# Patient Record
Sex: Male | Born: 1965
Health system: Southern US, Community
[De-identification: ages and names within clinical notes are randomized; demographics above are authoritative.]

## PROBLEM LIST (undated history)

## (undated) DIAGNOSIS — E291 Testicular hypofunction: Secondary | ICD-10-CM

## (undated) DIAGNOSIS — E875 Hyperkalemia: Secondary | ICD-10-CM

## (undated) DIAGNOSIS — I2510 Atherosclerotic heart disease of native coronary artery without angina pectoris: Secondary | ICD-10-CM

## (undated) DIAGNOSIS — I10 Essential (primary) hypertension: Secondary | ICD-10-CM

## (undated) DIAGNOSIS — N529 Male erectile dysfunction, unspecified: Secondary | ICD-10-CM

## (undated) DIAGNOSIS — N1831 Chronic kidney disease, stage 3a: Secondary | ICD-10-CM

## (undated) DIAGNOSIS — F329 Major depressive disorder, single episode, unspecified: Secondary | ICD-10-CM

## (undated) DIAGNOSIS — E7800 Pure hypercholesterolemia, unspecified: Secondary | ICD-10-CM

## (undated) DIAGNOSIS — M159 Polyosteoarthritis, unspecified: Secondary | ICD-10-CM

## (undated) DIAGNOSIS — E119 Type 2 diabetes mellitus without complications: Secondary | ICD-10-CM

## (undated) DIAGNOSIS — E1140 Type 2 diabetes mellitus with diabetic neuropathy, unspecified: Secondary | ICD-10-CM

## (undated) DIAGNOSIS — R5382 Chronic fatigue, unspecified: Secondary | ICD-10-CM

## (undated) DIAGNOSIS — F32A Depression, unspecified: Secondary | ICD-10-CM

## (undated) HISTORY — DX: Hyperkalemia: E87.5

## (undated) HISTORY — DX: Testicular hypofunction: E29.1

## (undated) HISTORY — DX: Male erectile dysfunction, unspecified: N52.9

## (undated) HISTORY — DX: Atherosclerotic heart disease of native coronary artery without angina pectoris: I25.10

## (undated) HISTORY — PX: KNEE SURGERY: SHX244

## (undated) HISTORY — DX: Chronic fatigue, unspecified: R53.82

## (undated) HISTORY — PX: EYE SURGERY: SHX253

## (undated) HISTORY — DX: Type 2 diabetes mellitus with diabetic neuropathy, unspecified: E11.40

## (undated) HISTORY — DX: Chronic kidney disease, stage 3a: N18.31

## (undated) HISTORY — DX: Type 2 diabetes mellitus without complications: E11.9

## (undated) HISTORY — DX: Polyosteoarthritis, unspecified: M15.9

## (undated) HISTORY — DX: Essential (primary) hypertension: I10

---

## 2006-02-01 HISTORY — PX: CORONARY ANGIOPLASTY WITH STENT PLACEMENT: SHX49

## 2007-03-30 ENCOUNTER — Emergency Department (HOSPITAL_COMMUNITY): Admission: EM | Admit: 2007-03-30 | Discharge: 2007-03-30 | Payer: Self-pay | Admitting: Emergency Medicine

## 2008-02-02 HISTORY — PX: CARDIAC CATHETERIZATION: SHX172

## 2008-04-18 ENCOUNTER — Emergency Department (HOSPITAL_COMMUNITY): Admission: EM | Admit: 2008-04-18 | Discharge: 2008-04-18 | Payer: Self-pay | Admitting: Emergency Medicine

## 2008-07-23 ENCOUNTER — Inpatient Hospital Stay (HOSPITAL_COMMUNITY): Admission: EM | Admit: 2008-07-23 | Discharge: 2008-07-25 | Payer: Self-pay | Admitting: Emergency Medicine

## 2008-09-02 ENCOUNTER — Emergency Department (HOSPITAL_COMMUNITY): Admission: EM | Admit: 2008-09-02 | Discharge: 2008-09-02 | Payer: Self-pay | Admitting: Emergency Medicine

## 2009-08-14 ENCOUNTER — Emergency Department (HOSPITAL_COMMUNITY): Admission: EM | Admit: 2009-08-14 | Discharge: 2009-08-14 | Payer: Self-pay | Admitting: Family Medicine

## 2010-03-03 ENCOUNTER — Emergency Department (HOSPITAL_COMMUNITY)
Admission: EM | Admit: 2010-03-03 | Discharge: 2010-03-03 | Payer: Self-pay | Source: Home / Self Care | Admitting: Emergency Medicine

## 2010-03-03 LAB — URINE MICROSCOPIC-ADD ON

## 2010-03-03 LAB — DIFFERENTIAL
Basophils Absolute: 0 10*3/uL (ref 0.0–0.1)
Basophils Relative: 0 % (ref 0–1)
Eosinophils Absolute: 0 10*3/uL (ref 0.0–0.7)
Eosinophils Relative: 0 % (ref 0–5)
Monocytes Relative: 5 % (ref 3–12)
Neutro Abs: 6.7 10*3/uL (ref 1.7–7.7)

## 2010-03-03 LAB — COMPREHENSIVE METABOLIC PANEL
CO2: 22 mEq/L (ref 19–32)
Calcium: 10 mg/dL (ref 8.4–10.5)
Creatinine, Ser: 1.24 mg/dL (ref 0.4–1.5)
GFR calc Af Amer: >60 mL/min (ref >60)
Glucose, Bld: 374 mg/dL — ABNORMAL HIGH (ref 70–99)
Sodium: 135 mEq/L (ref 135–145)
Total Bilirubin: 0.3 mg/dL (ref 0.3–1.2)
Total Protein: 7.1 g/dL (ref 6.0–8.3)

## 2010-03-03 LAB — POCT I-STAT, CHEM 8
Hemoglobin: 15.6 g/dL (ref 13.0–17.0)
Sodium: 137 mEq/L (ref 135–145)
TCO2: 22 mmol/L (ref 0–100)

## 2010-03-03 LAB — URINALYSIS, ROUTINE W REFLEX MICROSCOPIC
Hgb urine dipstick: NEGATIVE
Ketones, ur: NEGATIVE mg/dL
Leukocytes, UA: NEGATIVE

## 2010-03-03 LAB — POCT CARDIAC MARKERS: CKMB, poc: 1.9 ng/mL (ref 1.0–8.0)

## 2010-03-03 LAB — CBC
Hemoglobin: 14.7 g/dL (ref 13.0–17.0)
MCH: 28.4 pg (ref 26.0–34.0)
RBC: 5.17 MIL/uL (ref 4.22–5.81)
RDW: 13.3 % (ref 11.5–15.5)

## 2010-03-03 LAB — GLUCOSE, CAPILLARY: Glucose-Capillary: 270 mg/dL — ABNORMAL HIGH (ref 70–99)

## 2010-05-10 LAB — URINALYSIS, ROUTINE W REFLEX MICROSCOPIC
Ketones, ur: NEGATIVE mg/dL
Nitrite: NEGATIVE
Protein, ur: NEGATIVE mg/dL
Urobilinogen, UA: 1 mg/dL (ref 0.0–1.0)
pH: 5.5 (ref 5.0–8.0)

## 2010-05-10 LAB — GLUCOSE, CAPILLARY: Glucose-Capillary: 195 mg/dL — ABNORMAL HIGH (ref 70–99)

## 2010-05-11 LAB — RAPID URINE DRUG SCREEN, HOSP PERFORMED
Amphetamines: NOT DETECTED
Benzodiazepines: NOT DETECTED
Cocaine: NOT DETECTED
Opiates: NOT DETECTED
Tetrahydrocannabinol: POSITIVE — AB

## 2010-05-11 LAB — CARDIAC PANEL(CRET KIN+CKTOT+MB+TROPI)
CK, MB: 0.8 ng/mL (ref 0.3–4.0)
Relative Index: 0.9 (ref 0.0–2.5)
Total CK: 119 U/L (ref 7–232)
Total CK: 95 U/L (ref 7–232)
Troponin I: 0.01 ng/mL (ref 0.00–0.06)
Troponin I: 0.02 ng/mL (ref 0.00–0.06)

## 2010-05-11 LAB — DIFFERENTIAL
Basophils Absolute: 0.1 10*3/uL (ref 0.0–0.1)
Basophils Relative: 2 % — ABNORMAL HIGH (ref 0–1)
Eosinophils Absolute: 0 10*3/uL (ref 0.0–0.7)
Eosinophils Absolute: 0.1 10*3/uL (ref 0.0–0.7)
Eosinophils Relative: 1 % (ref 0–5)
Neutro Abs: 2.8 10*3/uL (ref 1.7–7.7)
Neutro Abs: 4 10*3/uL (ref 1.7–7.7)
Neutrophils Relative %: 47 % (ref 43–77)

## 2010-05-11 LAB — CBC
HCT: 48 % (ref 39.0–52.0)
Hemoglobin: 14.9 g/dL (ref 13.0–17.0)
Hemoglobin: 16.4 g/dL (ref 13.0–17.0)
MCHC: 33.8 g/dL (ref 30.0–36.0)
MCHC: 34.3 g/dL (ref 30.0–36.0)
MCV: 89.5 fL (ref 78.0–100.0)
Platelets: 176 10*3/uL (ref 150–400)
Platelets: 181 10*3/uL (ref 150–400)
Platelets: 197 10*3/uL (ref 150–400)
RBC: 4.91 MIL/uL (ref 4.22–5.81)
RBC: 5.4 MIL/uL (ref 4.22–5.81)
WBC: 6.1 10*3/uL (ref 4.0–10.5)
WBC: 7.5 10*3/uL (ref 4.0–10.5)
WBC: 8 10*3/uL (ref 4.0–10.5)

## 2010-05-11 LAB — HEMOGLOBIN A1C
Hgb A1c MFr Bld: 8.6 % — ABNORMAL HIGH (ref 4.6–6.1)
Mean Plasma Glucose: 200 mg/dL

## 2010-05-11 LAB — BASIC METABOLIC PANEL
BUN: 13 mg/dL (ref 6–23)
Calcium: 8.9 mg/dL (ref 8.4–10.5)
Chloride: 106 mEq/L (ref 96–112)
Creatinine, Ser: 1.14 mg/dL (ref 0.4–1.5)

## 2010-05-11 LAB — COMPREHENSIVE METABOLIC PANEL
Alkaline Phosphatase: 52 U/L (ref 39–117)
BUN: 14 mg/dL (ref 6–23)
Chloride: 107 mEq/L (ref 96–112)
Creatinine, Ser: 1.1 mg/dL (ref 0.4–1.5)
GFR calc non Af Amer: >60 mL/min (ref >60)
Glucose, Bld: 200 mg/dL — ABNORMAL HIGH (ref 70–99)
Potassium: 4.3 mEq/L (ref 3.5–5.1)
Total Bilirubin: 0.5 mg/dL (ref 0.3–1.2)

## 2010-05-11 LAB — LIPID PANEL: Triglycerides: 162 mg/dL — ABNORMAL HIGH (ref <150)

## 2010-05-11 LAB — C-REACTIVE PROTEIN: CRP: 0.2 mg/dL — ABNORMAL LOW (ref <0.6)

## 2010-05-11 LAB — POCT CARDIAC MARKERS
CKMB, poc: <1 ng/mL — ABNORMAL LOW (ref 1.0–8.0)
Troponin i, poc: <0.05 ng/mL (ref 0.00–0.09)

## 2010-05-11 LAB — HEPARIN LEVEL (UNFRACTIONATED): Heparin Unfractionated: 0.68 IU/mL (ref 0.30–0.70)

## 2010-05-11 LAB — POCT I-STAT, CHEM 8
Creatinine, Ser: 1.2 mg/dL (ref 0.4–1.5)
TCO2: 21 mmol/L (ref 0–100)

## 2010-05-11 LAB — GLUCOSE, CAPILLARY
Glucose-Capillary: 148 mg/dL — ABNORMAL HIGH (ref 70–99)
Glucose-Capillary: 160 mg/dL — ABNORMAL HIGH (ref 70–99)
Glucose-Capillary: 233 mg/dL — ABNORMAL HIGH (ref 70–99)

## 2010-05-11 LAB — TROPONIN I: Troponin I: 0.02 ng/mL (ref 0.00–0.06)

## 2010-05-14 LAB — HEPATIC FUNCTION PANEL
Alkaline Phosphatase: 70 U/L (ref 39–117)
Indirect Bilirubin: 0.7 mg/dL (ref 0.3–0.9)
Total Bilirubin: 1 mg/dL (ref 0.3–1.2)

## 2010-05-14 LAB — CBC
Hemoglobin: 15.6 g/dL (ref 13.0–17.0)
MCHC: 34.9 g/dL (ref 30.0–36.0)
MCV: 86.7 fL (ref 78.0–100.0)
RBC: 5.16 MIL/uL (ref 4.22–5.81)

## 2010-05-14 LAB — BASIC METABOLIC PANEL
CO2: 22 mEq/L (ref 19–32)
Chloride: 106 mEq/L (ref 96–112)
GFR calc Af Amer: >60 mL/min (ref >60)
Sodium: 137 mEq/L (ref 135–145)

## 2010-05-14 LAB — POCT CARDIAC MARKERS
Myoglobin, poc: 75 ng/mL (ref 12–200)
Troponin i, poc: <0.05 ng/mL (ref 0.00–0.09)

## 2010-05-14 LAB — TROPONIN I: Troponin I: <0.01 ng/mL (ref 0.00–0.06)

## 2010-06-16 NOTE — Cardiovascular Report (Signed)
NAME:  [name], [name]                  ACCOUNT NO.:  [id number]   MEDICAL RECORD NO.:  [id number]          PATIENT TYPE:  INP   LOCATION:  3707                         FACILITY:  [facility]   PHYSICIAN:  [name], M.D. DATE OF BIRTH:  05-17-1965   DATE OF PROCEDURE:  07/24/2008  DATE OF DISCHARGE:                            CARDIAC CATHETERIZATION   PROCEDURE:  Left cardiac catheterization with selective left and right  coronary angiography, left ventricular graft via right groin using  Judkins technique.   INDICATIONS FOR PROCEDURE:  [name] is a 45 year old black male with  past medical history significant for non-insulin-dependent diabetes  mellitus, strong family history of coronary artery disease.  Father had  MI x4, first MI was in his 57s, morbid obesity.  He came to the ER  complaining of retrosternal chest pain described as needle and sharp,  grade 7/10 associated with nausea, vomiting, and diaphoresis while at  work.  The patient also gives history of exertional chest pain relieves  with rest.  Denies any shortness of breath, palpitation,  lightheadedness, or syncope.  The patient had CT angio in the ED, which  showed haziness in left main likely artifact and narrowing in diagonal 1  and 2 and was admitted for further evaluation.  Due to recurrent chest  pain, multiple risk factors, and positive CT angio, discussed with the  patient at length regarding left cath, possible PTCA stenting, its risks  and benefits, i.e. death, MI, stroke, need for emergency CABG, risk of  restenosis, local vascular complications, etc. and consented for the  procedure.   PROCEDURE:  After obtaining the informed consent, the patient was  brought to the cath lab and was placed on fluoroscopy table.  Right  groin was prepped and draped in usual fashion.  Xylocaine 2% was used  for local anesthesia in the right groin.  With the help of thin-wall  needle, 6-French arterial sheath was placed.  The  sheath was aspirated  and flushed.  Next, a 6-French left Judkins catheter was advanced over  the wire under fluoroscopic guidance up to the ascending aorta.  Wire  was pulled out, the catheter was aspirated, and connected to the  manifold.  Catheter was further advanced and engaged into left coronary  ostium.  Multiple views of the left system were taken.  Next, the  catheter was disengaged and was pulled out over the wire and was  replaced with 6-French right Judkins catheter, which was advanced over  the wire under fluoroscopic guidance up to the ascending aorta.  Wire  was pulled out, the catheter was aspirated, and connected to the  manifold.  Catheter was further advanced and engaged into right coronary  ostium.  Multiple views of the right system were taken.  Next, catheter  was disengaged and was pulled out over the wire and was replaced with 6-  French pigtail catheter, which was advanced over the wire under  fluoroscopic guidance up to the ascending aorta.  Wire was pulled out,  the catheter was aspirated and connected to the manifold.  Catheter was  further advanced across the aortic valve into the LV.  LV pressures were  recorded.  Next, LV graft was done in 30-degree RAO position.  Next,  postangiographic pressures were recorded from LV and then pullback  pressures were recorded from the aorta.  There was no gradient across  the aortic valve.  Next, the pigtail catheter was pulled out over the  wire.  Sheaths were aspirated and flushed.   FINDINGS:  LV showed good LV systolic function.  LVH, EF of 50-55%.  Left main was patent.  LAD was patent.  High diagonal 1 has 20-25%  ostial stenosis.  Diagonal 2 and 3 were patent.  Left circumflex has 20-  25% proximal  stenosis.  OM-1 is small, which is patent.  OM-2 is very small.  OM III  is very small.  RCA has 5-10% ostial stenosis.  PDA and PLV branches  were patent.  The patient tolerated the procedure well.  There were no   complications.  The patient was transferred to the recovery room in  stable condition.      [name]. Sharyn Watts, M.D.  Electronically Signed     MNH/MEDQ  D:  07/24/2008  T:  07/25/2008  Job:  [id number]   cc:   Cath Lab

## 2010-06-16 NOTE — Discharge Summary (Signed)
NAME:  [name], [name]                  ACCOUNT NO.:  [id number]   MEDICAL RECORD NO.:  [id number]           PATIENT TYPE:   LOCATION:                                 FACILITY:   PHYSICIAN:  Mohan N. Sharyn Lull, M.D. DATE OF BIRTH:  October 27, 1965   DATE OF ADMISSION:  DATE OF DISCHARGE:                               DISCHARGE SUMMARY   ADMITTING DIAGNOSES:  1. Atypical chest pain.  The patient had positive CT angiography, rule      out coronary insufficiency.  2. Non-insulin-dependent diabetes mellitus.  3. Morbid obesity.  4. Positive family history of coronary artery disease.   FINAL DIAGNOSES:  1. Status post chest pain, positive CT angiography, status post left      catheterization, mild coronary artery disease.  2. Non-insulin-dependent diabetes mellitus.  3. Morbid obesity.  4. Hypercholesteremia, controlled by diet.  5. Positive family history for coronary artery disease.  6. Left shoulder musculoskeletal pain.   DISCHARGE HOME MEDICATIONS:  1. Enteric-coated aspirin 81 mg 1 tablet daily.  2. Lisinopril 5 mg 1 tablet daily.  3. Amaryl 4 mg 1 tablet twice daily.  4. Metformin 1000 mg with lunch and 1500 mg with supper, starting from      July 26, 2008.  5. Pepcid 20 mg 1 tablet twice daily.  6. Percocet 5/325 one tablet every 6 hours as needed for pain.   DIET:  Low-salt, low-cholesterol 1800 calories ADA diet.  The patient  has been advised to reduce weight.  Post cardiac cath instructions have  been given.   ACTIVITY:  Avoid any lifting, pushing, or pulling for 48 hours.   The patient has been advised to monitor blood sugar daily.  Follow up  with me in 1 week.   CONDITION AT DISCHARGE:  Stable.   BRIEF HISTORY AND HOSPITAL COURSE:  [name] is a 45 year old black male  with past medical history significant for non-insulin-dependent diabetes  mellitus; strong family history of coronary artery disease, his father  had MI x4, first MI was at the age of 20; morbid obesity.   He came to  the ER complaining of retrosternal chest pain described as needle and  sharp pain, grade 7/10, associated with nausea, vomiting, and  diaphoresis while at work.  The patient also gives history of exertional  chest pain, relieves with rest.  Denies any shortness of breath,  palpitation, lightheadedness, or syncope.  The patient had CT angio in  the ED, which showed haziness in the left main likely due to artifact  and narrowing in diagonal 1 and d2.  The patient was admitted for  further evaluation.   PAST MEDICAL HISTORY:  As above.   PAST SURGICAL HISTORY:  He had left knee surgery in the past.   ALLERGIES:  No known drug allergies.   MEDICATIONS AT HOME:  He was on:  1. Glucophage 1000 mg p.o. b.i.d.  2. Glucotrol 5 mg p.o. b.i.d.   SOCIAL HISTORY:  He is single, 2 children.  No history of smoking or  alcohol abuse.  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   FAMILY HISTORY:  Father died of MI at the age of 20.  His first MI was  at the age of 55.  Mother is alive.  She is hypertensive, and he has 1  brother and sister.  They are in good health.   PHYSICAL EXAMINATION:  GENERAL:  He is alert, awake, and oriented x3, in  no acute distress.  VITAL SIGNS:  Blood pressure was 119/87, pulse was 82 and regular.  HEENT:  Conjunctivae pink.  NECK:  Supple.  No JVD.  No bruit.  LUNGS:  Clear to auscultation without rhonchi or rales.  CARDIOVASCULAR:  S1 and S2 were normal.  There was no murmur.  There was  no S3 or S4 gallop.  ABDOMEN:  Soft.  Bowel sounds were present, nontender.  EXTREMITIES:  There is no clubbing, cyanosis, or edema.   LABORATORY DATA:  EKG showed normal sinus rhythm with early  repolarization changes.  Hemoglobin was 15.4, hematocrit 48, white count of 8.0.  His sodium was  139, potassium 4.4, glucose 152, BUN 23, creatinine 1.2.  Urine drug  screen was negative.  His 3 sets of cardiac enzymes were negative.  C-  reactive protein was 0.2,  which was in normal range.  Hemoglobin A1c was  elevated at 8.6.  His cholesterol was 158, triglyceride 162, HDL 35, LDL  of 91.   BRIEF HOSPITAL COURSE:  The patient was admitted to telemetry unit.  MI  was ruled out by serial enzymes and EKG.  The patient did not have any  episode of further chest pain during the hospital stay.  I discussed  with the patient regarding CT angio finding and left cath, possible PTCA  and stenting, its risks and benefits, and consented for the procedure.  The patient underwent left cardiac cath with selective left and right  coronary angiography as per procedure report.  The patient was noted to  have mild coronary artery disease.  The patient postprocedure did not  have any chest pain.  His groin is stable with no evidence of hematoma  or bruit.  The patient has been ambulating in the hallway without any  problems.  The patient will be discharged home on above medications and  will be followed up in my office next week.      Eduardo Osier. Sharyn Lull, M.D.  Electronically Signed     MNH/MEDQ  D:  07/25/2008  T:  07/25/2008  Job:  [id number]

## 2010-07-15 ENCOUNTER — Inpatient Hospital Stay (HOSPITAL_COMMUNITY)
Admission: EM | Admit: 2010-07-15 | Discharge: 2010-07-22 | DRG: 392 | Disposition: A | Discharge status: Home / Self Care | Payer: Managed Care, Other (non HMO) | Attending: Internal Medicine | Admitting: Internal Medicine

## 2010-07-15 DIAGNOSIS — I10 Essential (primary) hypertension: Secondary | ICD-10-CM | POA: Diagnosis present

## 2010-07-15 DIAGNOSIS — K5289 Other specified noninfective gastroenteritis and colitis: Secondary | ICD-10-CM | POA: Diagnosis present

## 2010-07-15 DIAGNOSIS — E119 Type 2 diabetes mellitus without complications: Secondary | ICD-10-CM | POA: Diagnosis present

## 2010-07-15 DIAGNOSIS — F411 Generalized anxiety disorder: Secondary | ICD-10-CM | POA: Diagnosis present

## 2010-07-15 DIAGNOSIS — M940 Chondrocostal junction syndrome [Tietze]: Secondary | ICD-10-CM | POA: Diagnosis present

## 2010-07-15 DIAGNOSIS — R933 Abnormal findings on diagnostic imaging of other parts of digestive tract: Secondary | ICD-10-CM | POA: Diagnosis present

## 2010-07-15 DIAGNOSIS — Z79899 Other long term (current) drug therapy: Secondary | ICD-10-CM

## 2010-07-15 DIAGNOSIS — R109 Unspecified abdominal pain: Principal | ICD-10-CM | POA: Diagnosis present

## 2010-07-15 DIAGNOSIS — E785 Hyperlipidemia, unspecified: Secondary | ICD-10-CM | POA: Diagnosis present

## 2010-07-15 DIAGNOSIS — K644 Residual hemorrhoidal skin tags: Secondary | ICD-10-CM | POA: Diagnosis present

## 2010-07-15 LAB — DIFFERENTIAL
Basophils Absolute: 0 10*3/uL (ref 0.0–0.1)
Basophils Relative: 0 % (ref 0–1)
Eosinophils Absolute: 0.1 10*3/uL (ref 0.0–0.7)
Eosinophils Relative: 1 % (ref 0–5)
Lymphs Abs: 4.9 10*3/uL — ABNORMAL HIGH (ref 0.7–4.0)
Neutrophils Relative %: 40 % — ABNORMAL LOW (ref 43–77)

## 2010-07-15 LAB — CBC
MCV: 83 fL (ref 78.0–100.0)
Platelets: 197 10*3/uL (ref 150–400)
RBC: 5 MIL/uL (ref 4.22–5.81)
RDW: 13.1 % (ref 11.5–15.5)
WBC: 9.2 10*3/uL (ref 4.0–10.5)

## 2010-07-15 LAB — POCT I-STAT, CHEM 8
BUN: 15 mg/dL (ref 6–23)
Chloride: 105 mEq/L (ref 96–112)
HCT: 45 % (ref 39.0–52.0)
Potassium: 4.2 mEq/L (ref 3.5–5.1)
Sodium: 137 mEq/L (ref 135–145)

## 2010-07-15 LAB — OCCULT BLOOD, POC DEVICE: Fecal Occult Bld: POSITIVE

## 2010-07-16 ENCOUNTER — Emergency Department (HOSPITAL_COMMUNITY): Payer: Managed Care, Other (non HMO)

## 2010-07-16 ENCOUNTER — Encounter (HOSPITAL_COMMUNITY): Payer: Self-pay

## 2010-07-16 LAB — GLUCOSE, CAPILLARY: Glucose-Capillary: 204 mg/dL — ABNORMAL HIGH (ref 70–99)

## 2010-07-16 LAB — CBC
HCT: 37.9 % — ABNORMAL LOW (ref 39.0–52.0)
HCT: 38.5 % — ABNORMAL LOW (ref 39.0–52.0)
Hemoglobin: 13.3 g/dL (ref 13.0–17.0)
MCH: 28.7 pg (ref 26.0–34.0)
MCV: 83.7 fL (ref 78.0–100.0)
Platelets: 154 10*3/uL (ref 150–400)
RBC: 4.53 MIL/uL (ref 4.22–5.81)
RBC: 4.63 MIL/uL (ref 4.22–5.81)
RDW: 13.1 % (ref 11.5–15.5)
WBC: 6.2 10*3/uL (ref 4.0–10.5)

## 2010-07-16 LAB — LIPID PANEL
Cholesterol: 100 mg/dL (ref 0–200)
HDL: 34 mg/dL — ABNORMAL LOW (ref >39)
Triglycerides: 133 mg/dL (ref <150)

## 2010-07-16 LAB — URINALYSIS, ROUTINE W REFLEX MICROSCOPIC
Hgb urine dipstick: NEGATIVE
Nitrite: NEGATIVE
Protein, ur: NEGATIVE mg/dL
Specific Gravity, Urine: 1.034 — ABNORMAL HIGH (ref 1.005–1.030)
Urobilinogen, UA: 1 mg/dL (ref 0.0–1.0)

## 2010-07-16 LAB — CARDIAC PANEL(CRET KIN+CKTOT+MB+TROPI)
CK, MB: 2.2 ng/mL (ref 0.3–4.0)
CK, MB: 2 ng/mL (ref 0.3–4.0)
Relative Index: 1.4 (ref 0.0–2.5)
Relative Index: 1.6 (ref 0.0–2.5)
Total CK: 136 U/L (ref 7–232)
Total CK: 129 U/L (ref 7–232)
Troponin I: <0.3 ng/mL (ref <0.30)
Troponin I: <0.3 ng/mL (ref <0.30)

## 2010-07-16 LAB — URINE MICROSCOPIC-ADD ON

## 2010-07-16 LAB — CROSSMATCH: ABO/RH(D): B POS

## 2010-07-16 LAB — SAMPLE TO BLOOD BANK

## 2010-07-16 MED ORDER — IOHEXOL 300 MG/ML  SOLN
100.0000 mL | Freq: Once | INTRAMUSCULAR | Status: AC | PRN
  Administered 2010-07-16: 100 mL via INTRAVENOUS

## 2010-07-17 LAB — CBC
HCT: 36.4 % — ABNORMAL LOW (ref 39.0–52.0)
MCH: 29.1 pg (ref 26.0–34.0)
MCH: 28.5 pg (ref 26.0–34.0)
MCHC: 35 g/dL (ref 30.0–36.0)
MCV: 83.3 fL (ref 78.0–100.0)
MCV: 83.6 fL (ref 78.0–100.0)
Platelets: 159 10*3/uL (ref 150–400)
Platelets: 160 10*3/uL (ref 150–400)
Platelets: 176 10*3/uL (ref 150–400)
RBC: 4.45 MIL/uL (ref 4.22–5.81)
RBC: 4.46 MIL/uL (ref 4.22–5.81)
RDW: 13.1 % (ref 11.5–15.5)
RDW: 13 % (ref 11.5–15.5)
RDW: 12.8 % (ref 11.5–15.5)
WBC: 5.2 10*3/uL (ref 4.0–10.5)
WBC: 5.5 10*3/uL (ref 4.0–10.5)
WBC: 6.3 10*3/uL (ref 4.0–10.5)

## 2010-07-17 LAB — COMPREHENSIVE METABOLIC PANEL
AST: 16 U/L (ref 0–37)
Albumin: 3.1 g/dL — ABNORMAL LOW (ref 3.5–5.2)
Calcium: 8.6 mg/dL (ref 8.4–10.5)
Creatinine, Ser: 0.84 mg/dL (ref 0.50–1.35)
GFR calc non Af Amer: >60 mL/min (ref >60)

## 2010-07-17 LAB — URINE CULTURE
Colony Count: NO GROWTH
Culture  Setup Time: 201206140453

## 2010-07-17 LAB — GLUCOSE, CAPILLARY

## 2010-07-18 DIAGNOSIS — R1031 Right lower quadrant pain: Secondary | ICD-10-CM

## 2010-07-18 LAB — CBC
HCT: 37.7 % — ABNORMAL LOW (ref 39.0–52.0)
HCT: 38.8 % — ABNORMAL LOW (ref 39.0–52.0)
HCT: 36.5 % — ABNORMAL LOW (ref 39.0–52.0)
HCT: 35.9 % — ABNORMAL LOW (ref 39.0–52.0)
Hemoglobin: 12.6 g/dL — ABNORMAL LOW (ref 13.0–17.0)
Hemoglobin: 12.6 g/dL — ABNORMAL LOW (ref 13.0–17.0)
Hemoglobin: 12.8 g/dL — ABNORMAL LOW (ref 13.0–17.0)
Hemoglobin: 12.9 g/dL — ABNORMAL LOW (ref 13.0–17.0)
Hemoglobin: 13.5 g/dL (ref 13.0–17.0)
MCH: 29 pg (ref 26.0–34.0)
MCHC: 34.5 g/dL (ref 30.0–36.0)
MCHC: 35.7 g/dL (ref 30.0–36.0)
MCV: 82.9 fL (ref 78.0–100.0)
MCV: 82.6 fL (ref 78.0–100.0)
MCV: 82.6 fL (ref 78.0–100.0)
RBC: 4.34 MIL/uL (ref 4.22–5.81)
RBC: 4.36 MIL/uL (ref 4.22–5.81)
RBC: 4.55 MIL/uL (ref 4.22–5.81)
RBC: 4.7 MIL/uL (ref 4.22–5.81)
RDW: 12.8 % (ref 11.5–15.5)
RDW: 12.8 % (ref 11.5–15.5)
WBC: 5.5 10*3/uL (ref 4.0–10.5)
WBC: 5.8 10*3/uL (ref 4.0–10.5)
WBC: 5.8 10*3/uL (ref 4.0–10.5)

## 2010-07-18 LAB — BASIC METABOLIC PANEL
BUN: 9 mg/dL (ref 6–23)
CO2: 24 mEq/L (ref 19–32)
Chloride: 107 mEq/L (ref 96–112)
Creatinine, Ser: 0.92 mg/dL (ref 0.50–1.35)
Glucose, Bld: 189 mg/dL — ABNORMAL HIGH (ref 70–99)
Potassium: 4.2 mEq/L (ref 3.5–5.1)

## 2010-07-18 LAB — GLUCOSE, CAPILLARY: Glucose-Capillary: 269 mg/dL — ABNORMAL HIGH (ref 70–99)

## 2010-07-18 LAB — DIFFERENTIAL
Basophils Absolute: 0 10*3/uL (ref 0.0–0.1)
Eosinophils Relative: 1 % (ref 0–5)
Lymphocytes Relative: 51 % — ABNORMAL HIGH (ref 12–46)
Lymphs Abs: 2.8 10*3/uL (ref 0.7–4.0)
Monocytes Absolute: 0.3 10*3/uL (ref 0.1–1.0)
Neutro Abs: 2.4 10*3/uL (ref 1.7–7.7)

## 2010-07-19 DIAGNOSIS — R1031 Right lower quadrant pain: Secondary | ICD-10-CM

## 2010-07-19 LAB — CBC
HCT: 36.5 % — ABNORMAL LOW (ref 39.0–52.0)
Hemoglobin: 12.3 g/dL — ABNORMAL LOW (ref 13.0–17.0)
Hemoglobin: 12.8 g/dL — ABNORMAL LOW (ref 13.0–17.0)
Hemoglobin: 13 g/dL (ref 13.0–17.0)
MCH: 29 pg (ref 26.0–34.0)
MCV: 82.6 fL (ref 78.0–100.0)
Platelets: 164 10*3/uL (ref 150–400)
Platelets: 164 10*3/uL (ref 150–400)
RBC: 4.24 MIL/uL (ref 4.22–5.81)
RBC: 4.42 MIL/uL (ref 4.22–5.81)
RBC: 4.46 MIL/uL (ref 4.22–5.81)
WBC: 5.5 10*3/uL (ref 4.0–10.5)
WBC: 5.1 10*3/uL (ref 4.0–10.5)

## 2010-07-19 LAB — URINALYSIS, ROUTINE W REFLEX MICROSCOPIC
Ketones, ur: NEGATIVE mg/dL
Leukocytes, UA: NEGATIVE
Nitrite: NEGATIVE
Protein, ur: NEGATIVE mg/dL
Urobilinogen, UA: 0.2 mg/dL (ref 0.0–1.0)
pH: 6 (ref 5.0–8.0)

## 2010-07-19 LAB — GLUCOSE, CAPILLARY
Glucose-Capillary: 223 mg/dL — ABNORMAL HIGH (ref 70–99)
Glucose-Capillary: 192 mg/dL — ABNORMAL HIGH (ref 70–99)
Glucose-Capillary: 163 mg/dL — ABNORMAL HIGH (ref 70–99)
Glucose-Capillary: 224 mg/dL — ABNORMAL HIGH (ref 70–99)

## 2010-07-19 LAB — LIPASE, BLOOD: Lipase: 35 U/L (ref 11–59)

## 2010-07-19 LAB — BASIC METABOLIC PANEL
CO2: 24 mEq/L (ref 19–32)
Chloride: 104 mEq/L (ref 96–112)
Creatinine, Ser: 0.97 mg/dL (ref 0.50–1.35)
GFR calc Af Amer: >60 mL/min (ref >60)
Potassium: 4.2 mEq/L (ref 3.5–5.1)
Sodium: 137 mEq/L (ref 135–145)

## 2010-07-20 ENCOUNTER — Inpatient Hospital Stay (HOSPITAL_COMMUNITY): Payer: Managed Care, Other (non HMO)

## 2010-07-20 LAB — GLUCOSE, CAPILLARY
Glucose-Capillary: 317 mg/dL — ABNORMAL HIGH (ref 70–99)
Glucose-Capillary: 215 mg/dL — ABNORMAL HIGH (ref 70–99)

## 2010-07-21 LAB — BASIC METABOLIC PANEL
BUN: 12 mg/dL (ref 6–23)
CO2: 25 mEq/L (ref 19–32)
Calcium: 9.8 mg/dL (ref 8.4–10.5)
Chloride: 104 mEq/L (ref 96–112)
Creatinine, Ser: 0.96 mg/dL (ref 0.50–1.35)

## 2010-07-21 LAB — CBC
HCT: 39.6 % (ref 39.0–52.0)
MCH: 28.8 pg (ref 26.0–34.0)
MCV: 81.5 fL (ref 78.0–100.0)
Platelets: 212 10*3/uL (ref 150–400)
RBC: 4.86 MIL/uL (ref 4.22–5.81)

## 2010-07-21 LAB — GLUCOSE, CAPILLARY: Glucose-Capillary: 215 mg/dL — ABNORMAL HIGH (ref 70–99)

## 2010-07-22 LAB — GLUCOSE, CAPILLARY: Glucose-Capillary: 312 mg/dL — ABNORMAL HIGH (ref 70–99)

## 2010-07-22 NOTE — Discharge Summary (Signed)
[name], [name]                  ACCOUNT NO.:  [id number]  MEDICAL RECORD NO.:  [id number]  LOCATION:  1417                         FACILITY:  [facility]  PHYSICIAN:  Andreas Blower, MD       DATE OF BIRTH:  01/05/1966  DATE OF ADMISSION:  07/15/2010 DATE OF DISCHARGE:                              DISCHARGE SUMMARY   PRIMARY CARE PHYSICIAN:  In [place].  GASTROENTEROLOGY:  Jordan Hawks. Elnoria Howard, MD  SURGERY:  [facility], Troy Sine. Dwain Sarna, MD  DISCHARGE DIAGNOSES: 1. Right abdominal pain. 2. Lower gastrointestinal bleed. 3. Type 2 diabetes. 4. Hypertension. 5. Hyperlipidemia.  DISCHARGE MEDICATIONS:  To be dictated by the discharging physician at the time of discharge.  BRIEF ADMITTING HISTORY AND PHYSICAL:  [name] is a 45 year old African American male with history of hypertension, diabetes, hyperlipidemia who presents with right-sided abdominal pain and rectal bleeding.  RADIOLOGY/IMAGING: 1. The patient had CT of the abdomen and pelvis with contrast on July 16, 2010, which showed mural thickening of the ascending colon     which may represent colitis.  Diverticulitis is unlikely. 2. The patient had abdominal series on July 20, 2010, which shows no     acute or significant findings.  LABORATORY DATA:  CBC shows a white count of 11.7, hemoglobin 14.0, hematocrit 39.2, platelet count 212.  Electrolytes normal with a BUN of 12, creatinine 0.96.  Liver function tests normal except total protein is 5.6, albumin 3.6, hemoglobin A1c is 10.2.  Troponins negative x3. LDL is 39.  UA negative for nitrites and leukocytes.  Fecal occult is negative.  PROCEDURES: 1. The patient had colonoscopy on July 17, 2010, which showed     hemorrhoids, otherwise normal colonoscopy.  Right-sided abdominal     pain, etiology unclear.  Hospital Course: 1. Right sided abdominal pain. The patient had a CT scan which     showed mural thickening of the ascending colon which may    represent colitis.  As a result, the GI was consulted. Initially,     the patient was started on antibiotics. The patient had continued     significant right-sided abdominal pain. As a result, a     colonoscopy was done which was normal. Hemorrhoids were noted     on the colonoscopy.  GI thought that his pain maybe due to     possible costochondritis.  As a result, started the patient on     Medrol Dosepak. The patient was on ciprofloxacin and Flagyl     empirically until July 21, 2010, which will be discontinued. 2. GI bleed.  During the course of hospital stay, the patient has had     serial CBCs checked and hemoglobin was normal.  Had no further     hematochezia or any further bleeding. Colonoscopy was found to have     hemorrhoids.  Uncertain if the hemorrhoids are the cause for the     bleed. 3. Hypertension, stable.  Continue the patient on home medications. 4. Hyperlipidemia.  Continue the patient on home medications. 5. Type 2 diabetes.  The patient's blood sugars were better controlled  after the patient's diet was switched to diabetic diet, however,     the patient was started on steroids as a result will have slightly     elevated high blood sugars.  Given his hemoglobin A1c is 10.2, the     patient may benefit for more aggressive management of his diabetes     which is to be done as an outpatient.  Further addendum to be dictated by the discharging physician at the time of discharge.   Andreas Blower, MD   SR/MEDQ  D:  07/21/2010  T:  07/22/2010  Job:  [id number]  Electronically Signed by Wardell Heath Rhylie Stehr  on 07/22/2010 09:47:46 PM

## 2010-07-23 LAB — GLUCOSE, CAPILLARY: Glucose-Capillary: 267 mg/dL — ABNORMAL HIGH (ref 70–99)

## 2010-07-29 NOTE — Consult Note (Signed)
NAMEJACOBIE, STAMEY                  ACCOUNT NO.:  [id number]  MEDICAL RECORD NO.:  [id number]  LOCATION:  1417                         FACILITY:  [facility]  PHYSICIAN:  Jordan Hawks. Elnoria Howard, MD    DATE OF BIRTH:  1965/09/29  DATE OF CONSULTATION:  07/16/2010 DATE OF DISCHARGE:                                CONSULTATION   GI Consultation  REASON FOR CONSULTATION:  Hematochezia and abdominal pain.  This is unassigned triad hospitalist patient.  HISTORY OF PRESENT ILLNESS:  This is a 45 year old gentleman with a past medical history of diabetes and hypertension, who was admitted to the hospital with complaints of abdominal pain.  Patient stated that it started 2 days prior to his admission.  At work, he started noticing abdominal pain, subsequently, he started to have some bleeding.  He has never had the bleeding before; however, in the months prior, he did report having some left lower quadrant abdominal pain.  Because of the pain worsened to the point that it was intolerable, he presented to the Emergency Room for further evaluation and treatment.  No recent antibiotic use and no reports of any sick contacts.  CT scan of the abdomen was performed and revealed that there was a mural wall thickening in the ascending colon.  As a result of the symptoms and the findings, a GI consultation was requested for further evaluation and treatment.  PAST MEDICAL HISTORY:  As stated above.  PAST SURGICAL HISTORY:  As stated above.  FAMILY HISTORY:  Noncontributory.  SOCIAL HISTORY:  Negative for alcohol, tobacco or illicit drug use.  REVIEW OF SYSTEMS:  As stated above in history of present illness, otherwise negative.  MEDICATIONS: 1. Ciprofloxacin 400 mg IV q.12h. 2. Flagyl 500 mg IV q.8h. 3. Sliding-scale insulin. 4. Protonix 40 mg IV b.i.d. 5. Morphine 2 mg IV q.4h. p.r.n. 6. Zofran 4 mg IV q.6h. p.r.n.  PHYSICAL EXAMINATION:  VITAL SIGNS:  Blood pressure is 121/77, heart rate is  52, respirations 19, temperature is 97.1. GENERAL:  The patient is in no acute distress, alert and oriented. HEENT:  Normocephalic, atraumatic.  Extraocular muscles intact. NECK:  Supple.  No lymphadenopathy. LUNGS:  Clear to auscultation bilaterally. CARDIOVASCULAR:  Regular rate and rhythm. ABDOMEN:  Obese, soft, tender in the lower quadrant.  No rebound or rigidity.  Positive bowel sounds. EXTREMITIES:  No clubbing, cyanosis or edema.  LABORATORY VALUES:  White blood cell count 6.1, hemoglobin is 13.3, MCV 83.2, platelets at 171,000.  Sodium is 137, potassium 4.2, chloride 105, glucose 269, BUN 15, creatinine 0.9.  IMPRESSION: 1. Hematochezia. 2. Abdominal pain. 3. Abnormal computed tomography scan.  The patient certainly requires     a colonoscopy for further evaluation.  I am uncertain about the     etiology of his current symptoms.  It may be an infectious colitis     versus a malignant type of process.  PLAN:  Plan at this time is to perform a colonoscopy and then further recommendations will be made pending the findings.     Jordan Hawks Elnoria Howard, MD     PDH/MEDQ  D:  07/16/2010  T:  07/16/2010  Job:  [id number]  Electronically Signed by Jeani Hawking MD on 07/29/2010 06:57:43 AM

## 2010-08-02 NOTE — H&P (Signed)
[name], [name]                  ACCOUNT NO.:  [id number]  MEDICAL RECORD NO.:  [id number]  LOCATION:  WLED                         FACILITY:  [facility]  PHYSICIAN:  Lonia Blood, M.D.      DATE OF BIRTH:  [birth year]  DATE OF ADMISSION:  07/15/2010 DATE OF DISCHARGE:                             HISTORY & PHYSICAL   PRIMARY CARE PHYSICIAN:  [place], so he is unassigned to [place].  PRESENTING COMPLAINT:  Rectal bleed.  HISTORY OF PRESENT ILLNESS:  The patient is a 45 year old gentleman with a history of diabetes and hypertension who apparently has been doing fine until 3 days ago when he started having left lower quadrant abdominal pain, followed by rectal bleed.  This continued through yesterday and today, did not stop, hence his wife convinced him to come to the emergency room.  The patient complained of 9/10 abdominal pain at its height, currently down to 4/10.  No prior GI bleeds.  Denied any nausea, vomiting, or diarrhea.  Denied any sick contacts.  He did not eat outside the house.  No hematemesis.  PAST MEDICAL HISTORY: 1. Diabetes. 2. Hypertension.  ALLERGIES:  No known drug allergies.  CURRENT MEDICATIONS:  Lisinopril and metformin.  SOCIAL HISTORY:  He is married and lives in [place], but works in [place].  Denied tobacco, alcohol, or IV drug use.  FAMILY HISTORY:  Denied any family history of GI malignancy.  REVIEW OF SYSTEMS:  All systems reviewed are negative except per HPI.  PHYSICAL EXAMINATION:  VITAL SIGNS:  Temperature is 97.6, blood pressure 128/72, pulse 68, respiratory rate 18, sats 98% room air. GENERAL:  The patient is awake, alert, oriented, looks anxious and worried, but he is in no acute distress. HEENT:  PERRL.  EOMI.  No pallor.  No jaundice.  No rhinorrhea. NECK:  Supple.  No JVD.  No lymphadenopathy. RESPIRATORY:  He has good air entry bilaterally.  No wheezes.  No rales. No crackles. CARDIOVASCULAR:  He has S1, S2.  No audible  murmur. ABDOMEN:  Soft, full with positive tenderness especially in both the left and right lower quadrant.  No guarding.  EXTREMITIES:  No edema, cyanosis, or clubbing. SKIN:  No rashes or ulcers.  LABORATORY DATA:  Urinalysis, only glucosuria.  Sodium is 137, potassium 4.2, chloride 105, his glucose is 269, BUN is 15, creatinine 0.90. White count is 9.2, hemoglobin 14.5 with a platelet count of 127 and relatively normal differentials.  Fecal occult blood testing is positive.  CT abdomen and pelvis showed mural thickening of the ascending colon, which may represent colitis.  Diverticulitis was unlikely.  Other differentials include inflammatory bowel disease.  ASSESSMENT:  This is a 45 year old gentleman presenting with what appears to be colitis, probably due to gastrointestinal bleed.  More than likely this is infectious colitis, although inflammatory bowel disease cannot be entirely excluded.  PLAN: 1. Gastrointestinal bleed.  We will admit the patient, check serial     CBCs, IV Protonix, rest his bowel, 2 wide-bore IVs.  We will get GI    consult also. 2. Colitis, more than likely the cause of this is gastrointestinal     bleed, although his  hemoglobin is stable.  I will put his on     empiric Cipro, Flagyl.  He is afebrile at this point, but if his     temperature rises, we will get 2 sets of blood cultures. 3. Diabetes.  I will put him on sliding scale insulin while put him on     clear liquid diet for now. 4. Hypertension, also hold antihypertensives for now.  His blood     pressure seems reasonable.  Further treatment will depend on the patient's response to these measures.     Lonia Blood, M.D.     Verlin Grills  D:  07/16/2010  T:  07/16/2010  Job:  [id number]  Electronically Signed by Lonia Blood M.D. on 08/02/2010 12:35:00 AM

## 2010-08-04 NOTE — Consult Note (Signed)
  [name], [name]                  ACCOUNT NO.:  [id number]  MEDICAL RECORD NO.:  [id number]  LOCATION:  1417                         FACILITY:  [facility]  PHYSICIAN:  Thornton Park. Daphine Deutscher, MD  DATE OF BIRTH:  [birth year]  DATE OF CONSULTATION: DATE OF DISCHARGE:                                CONSULTATION   CHIEF COMPLAINT:  Right lower quadrant abdominal pain and history of lower GI bleed.  HISTORY:  This 45 year old African-American man with a 12-year history of diabetes mellitus, who was admitted on July 15, 2010 with rectal bleed.  He started having some left lower quadrant abdominal pain and had rectal bleeding.  The last time he had some bleeding was when he was prepping for his colonoscopy, and he had some blood on the tissue.  Since admission, he was seen by Dr. Elnoria Howard and underwent a colonoscopy which really was negative.  It did show some hemorrhoids but did not show any evidence of bleeding.  It also did not show anything in the ascending colon.  The patient is currently getting Cipro and Flagyl. Initially, his pain was in the left lower quadrant while today it is more in the right lower quadrant.  When I look at his CT scan from admission, it is quite striking in the right lower quadrant, the cecum and ascending colon, the wall thickening and narrowing.  It does not jibe with the findings on colonoscopy. Also, in his admission CBC with diff, he had a preponderance of lymphocytes.  No neutrophils or lower neutrophils.  This would imply may be some lymphocytic infiltrate as opposed to an acute infectious process.  His tenderness is mild and is more in the right flank.  On the CT scan, they saw a normal appendix.  In light of these findings, I think I would like to repeat a CBC with diff and examine his differential.  I would have to state after looking at the CT scan that there is a disparity between what was found on the colonoscopy and what is in on the CT. Hence, I have  to question whether the colonoscopy went all the way round to the ileocecal valve or not.  At the present time, there is no evidence of a surgical abdomen but we will follow with you.     Thornton Park Daphine Deutscher, MD     MBM/MEDQ  D:  07/18/2010  T:  07/18/2010  Job:  [id number]  Electronically Signed by Luretha Murphy MD on 08/04/2010 10:17:58 AM

## 2010-08-05 ENCOUNTER — Inpatient Hospital Stay (HOSPITAL_COMMUNITY)
Admission: EM | Admit: 2010-08-05 | Discharge: 2010-08-06 | DRG: 394 | Disposition: A | Discharge status: Home / Self Care | Payer: Managed Care, Other (non HMO) | Attending: Internal Medicine | Admitting: Internal Medicine

## 2010-08-05 ENCOUNTER — Emergency Department (HOSPITAL_COMMUNITY): Payer: Managed Care, Other (non HMO)

## 2010-08-05 DIAGNOSIS — I10 Essential (primary) hypertension: Secondary | ICD-10-CM | POA: Diagnosis present

## 2010-08-05 DIAGNOSIS — E860 Dehydration: Secondary | ICD-10-CM | POA: Diagnosis present

## 2010-08-05 DIAGNOSIS — E785 Hyperlipidemia, unspecified: Secondary | ICD-10-CM | POA: Diagnosis present

## 2010-08-05 DIAGNOSIS — K649 Unspecified hemorrhoids: Principal | ICD-10-CM | POA: Diagnosis present

## 2010-08-05 DIAGNOSIS — E119 Type 2 diabetes mellitus without complications: Secondary | ICD-10-CM | POA: Diagnosis present

## 2010-08-05 DIAGNOSIS — K625 Hemorrhage of anus and rectum: Secondary | ICD-10-CM | POA: Diagnosis present

## 2010-08-05 DIAGNOSIS — R1032 Left lower quadrant pain: Secondary | ICD-10-CM | POA: Diagnosis present

## 2010-08-05 LAB — DIFFERENTIAL
Eosinophils Absolute: 0 10*3/uL (ref 0.0–0.7)
Lymphocytes Relative: 33 % (ref 12–46)
Lymphs Abs: 2.6 10*3/uL (ref 0.7–4.0)
Neutro Abs: 4.8 10*3/uL (ref 1.7–7.7)
Neutrophils Relative %: 61 % (ref 43–77)

## 2010-08-05 LAB — URINALYSIS, ROUTINE W REFLEX MICROSCOPIC
Bilirubin Urine: NEGATIVE
Ketones, ur: NEGATIVE mg/dL
Nitrite: NEGATIVE
Specific Gravity, Urine: 1.03 (ref 1.005–1.030)
Urobilinogen, UA: 0.2 mg/dL (ref 0.0–1.0)

## 2010-08-05 LAB — GLUCOSE, CAPILLARY
Glucose-Capillary: 316 mg/dL — ABNORMAL HIGH (ref 70–99)
Glucose-Capillary: 207 mg/dL — ABNORMAL HIGH (ref 70–99)

## 2010-08-05 LAB — CBC
MCV: 83.2 fL (ref 78.0–100.0)
Platelets: 205 10*3/uL (ref 150–400)
RBC: 5.05 MIL/uL (ref 4.22–5.81)
WBC: 7.8 10*3/uL (ref 4.0–10.5)

## 2010-08-05 LAB — POCT I-STAT, CHEM 8
Calcium, Ion: 1.26 mmol/L (ref 1.12–1.32)
Chloride: 104 mEq/L (ref 96–112)
HCT: 44 % (ref 39.0–52.0)
Potassium: 4.6 mEq/L (ref 3.5–5.1)
Sodium: 137 mEq/L (ref 135–145)

## 2010-08-05 LAB — PROTIME-INR
INR: 0.94 (ref 0.00–1.49)
Prothrombin Time: 12.8 seconds (ref 11.6–15.2)

## 2010-08-05 NOTE — H&P (Signed)
NAMECLAYBORNE, DIVIS                  ACCOUNT NO.:  [id number]  MEDICAL RECORD NO.:  [id number]  LOCATION:  WLED                         FACILITY:  [facility]  PHYSICIAN:  Talmage Nap, MD  DATE OF BIRTH:  [birth year]  DATE OF ADMISSION:  08/05/2010 DATE OF DISCHARGE:                             HISTORY & PHYSICAL   PRIMARY CARE PHYSICIAN:  Unassigned.  History is obtainable from patient and the patient's spouse.  CHIEF COMPLAINT:  Left lower abdominal pain of 4 days duration and black tarry stool noticed today.  HISTORY OF PRESENT ILLNESS:  Patient is a 45 year old African American male with history of hypertension and diabetes mellitus,  was recently discharged from the hospital after patient had been managed for questionable colitis.  Patient was said to have presented with abdominal pain and subsequently had a colonoscopy, which was said to be essentially unremarkable; however, patient claimed post colonoscopy he continued to have pain in his left lower quadrant, which he described as colicky about 10/10 intensity with no radiation.  He denied any associated fever, chills or rigor.  He denied any nausea or vomiting; however, today patient observed that his stool was black-colored, but there was no diarrhea.  The pain in the left lower quadrant was said to have persisted and with a black tarry stool.  Patient decided to come to the Emergency Room to be evaluated.  PAST MEDICAL HISTORY:  Positive for: 1. Hypertension. 2. Diabetes mellitus.  PAST SURGICAL HISTORY:  Colonoscopy.  PREADMISSION MEDICATIONS:  Include: 1. Metformin 50 mg p.o. t.i.d. 2. Oxycodone/acetaminophen, dose unknown. 3. Ibuprofen 600 mg, frequency is unknown. 4. Glimepiride 4 mg p.o. daily.  ALLERGIES:  He has no known allergies.  SOCIAL HISTORY:  Negative for alcohol or tobacco use.  Patient works as a Advertising copywriter.  FAMILY HISTORY:  York Spaniel to be positive for diabetes mellitus.  REVIEW OF SYSTEMS:   Patient denies any history of headaches.  Complained of dryness of the mouth.  No nausea.  No vomiting.  No chest pain or shortness of breath.  No cough.  Complained of persistent pain in the left lower quadrant with associated black tarry stool.  Denied any dysuria or hematuria.  No swelling of the lower extremity.  No intolerance to heat or cold and no neuropsychiatric disorder.  PHYSICAL EXAMINATION:  GENERAL:  Young man with suboptimal hydration, in painful distress. VITAL SIGNS:  Blood pressure is 113/74, pulse is 81, respiratory rate 20, temperature is 98.2. HEENT:  Mild pallor, but pupils are reactive to light and extraocular muscles are intact. NECK:  No jugular venous distention.  No carotid bruit.  No lymphadenopathy. CHEST:  Clear to auscultation. CARDIOVASCULAR:  Heart sounds are one and two. ABDOMEN:  Soft with exquisite tenderness in the left lower quadrant with slight guarding.  No rigidity.  Liver, spleen and kidney not palpable. Bowel sounds are positive. EXTREMITIES:  No pedal edema. NEUROLOGIC EXAMINATION:  Nonfocal. MUSCULOSKELETAL SYSTEM:  Unremarkable. SKIN:  Showed decreased turgor.  LABORATORY DATA:  Chemistry showed a sodium of 137, potassium of 4.6, chloride of 104, BUN is 15, creatinine 0.90, glucose is 349.  Glucose capillary level is 7316.  Hematological indices  showed WBC of 7.8, hemoglobin of 14.5, hematocrit of 42.0, MCV of 83.2 with the platelet count of 605,000 with normal differentials.  Coagulation profile showed PT 12.8, INR 0.94.  DIAGNOSTIC STUDIES:  Imaging studies done on the patient include acute abdominal series, which showed nonobstructive bowel gas pattern.  IMPRESSION: 1. Left lower quadrant pain, questionable diverticulitis; however,     since patient just recently had colonoscopy , one should keep     in mind bowel perforation. 2. Melena, rule out upper gastrointestinal bleed. 3. Dehydration. 4. Diabetes mellitus. 5.  Hypertension.  PLAN:  Plan is to admit patient to general medical floor.  Patient will be n.p.o. for now.  He will be rehydrated with half-normal saline IV to go at rate of 120 mL an hour.  Pain control will be done with Dilaudid 2 mg IV q.4h. p.r.n.  Will empirically be given Flagyl 500 mg IV q.8h. and Cipro 400 mg IV q.12h.  His blood pressure will be maintained with Vasotec 0.625 mg IV q.12h.  He will be on Accu-Cheks t.i.d. with a.c. h.s. with regular insulin sliding scale (moderate scale).  GI prophylaxis will be with Protonix 40 mg IV q.24h. and DVT prophylaxis with SCD boots.  Further workup to be done on the patient will include CBC, CMP and magnesium will be repeated in a.m.  Patient will be followed and evaluated on day-to-day basis.     Talmage Nap, MD     CN/MEDQ  D:  08/05/2010  T:  08/05/2010  Job:  [id number]  Electronically Signed by Talmage Nap  on 08/05/2010 11:54:29 PM

## 2010-08-06 LAB — DIFFERENTIAL
Eosinophils Absolute: 0.1 10*3/uL (ref 0.0–0.7)
Eosinophils Relative: 1 % (ref 0–5)
Lymphs Abs: 4.3 10*3/uL — ABNORMAL HIGH (ref 0.7–4.0)
Monocytes Absolute: 0.5 10*3/uL (ref 0.1–1.0)
Monocytes Relative: 5 % (ref 3–12)

## 2010-08-06 LAB — COMPREHENSIVE METABOLIC PANEL
AST: 15 U/L (ref 0–37)
Albumin: 3 g/dL — ABNORMAL LOW (ref 3.5–5.2)
Calcium: 8.7 mg/dL (ref 8.4–10.5)
Creatinine, Ser: 0.83 mg/dL (ref 0.50–1.35)
GFR calc non Af Amer: >60 mL/min (ref >60)

## 2010-08-06 LAB — SEDIMENTATION RATE: Sed Rate: 7 mm/hr (ref 0–16)

## 2010-08-06 LAB — CBC
MCH: 28.3 pg (ref 26.0–34.0)
MCV: 83.2 fL (ref 78.0–100.0)
Platelets: 181 10*3/uL (ref 150–400)
RDW: 13.3 % (ref 11.5–15.5)

## 2010-08-06 LAB — OCCULT BLOOD, POC DEVICE: Fecal Occult Bld: POSITIVE

## 2010-08-06 LAB — MAGNESIUM: Magnesium: 2.1 mg/dL (ref 1.5–2.5)

## 2010-08-06 LAB — URINE CULTURE: Culture  Setup Time: 201207050120

## 2010-08-06 LAB — GLUCOSE, CAPILLARY

## 2010-08-07 LAB — C-REACTIVE PROTEIN: CRP: 0.1 mg/dL — ABNORMAL LOW (ref <0.6)

## 2010-08-11 LAB — ANTI-NEUTROPHIL ANTIBODY

## 2010-08-13 NOTE — Discharge Summary (Signed)
  [name], [name]                  ACCOUNT NO.:  [id number]  MEDICAL RECORD NO.:  [id number]  LOCATION:  1539                         FACILITY:  [facility]  PHYSICIAN:  Conley Canal, MD      DATE OF BIRTH:  24-Jul-1965  DATE OF ADMISSION:  08/05/2010 DATE OF DISCHARGE:  08/06/2010                        DISCHARGE SUMMARY - REFERRING   GASTROENTEROLOGIST:  Jordan Hawks. Elnoria Howard, MD  DISCHARGE DIAGNOSES: 1. Painless rectal bleeding, self limiting, most likely secondary to     bleeding hemorrhoids versus inflammatory bowel disease which needs     further workup. 2. Diabetes mellitus type 2. 3. Malignant hypertension. 4. Hyperlipidemia.  DISCHARGE MEDICATIONS: 1. Glimepiride 4 mg twice daily. 2. Lisinopril 20 mg daily. 3. Lovastatin 40 mg daily. 4. Metformin 850 mg 3 times daily. 5. Oxycodone 5 mg every 4 hours as needed, no new prescription given. 6. Prilosec 20 mg daily.  PROCEDURES PERFORMED:  X-ray of the abdomen on August 05, 2010, showed nonobstructive bowel gas pattern with no acute abnormality identified.  HOSPITAL COURSE:  This pleasant 45 year old male was admitted with a history of black tarry stool noticed on the day prior to admission.  He said the blood was mixed with the stool.  Upon admission, he was noted to have a stable hemoglobin and hematocrit and he also was hemodynamically stable.  He had been seen by GI in the last admission in June for similar complaints and at that time he had a colonoscopy which per report showed hemorrhoids, otherwise, was normal.  The patient was to follow with Dr. Jeani Hawking early next week but he noticed this blood in stool since coming to the emergency room.  Since admission, he has been hemodynamically stable.  His hemoglobin has stayed stable and he has not noticed any more bleeding.  His hemoglobin is 18.5, hematocrit 39.7 today.  At this point, I believe the best course of action is to put him on a PPI, order tests to screen for  inflammatory bowel disease, to avoid nonsteroidals and prednisone and have him follow with gastroenterology as scheduled 4 days from now.  CONDITION ON DISCHARGE:  The patient is discharged in stable condition.     Conley Canal, MD     SR/MEDQ  D:  08/06/2010  T:  08/06/2010  Job:  [id number]  cc:   Jordan Hawks. Elnoria Howard, MD Fax: [phone]  Electronically Signed by Conley Canal  on 08/13/2010 07:29:58 PM

## 2010-08-14 LAB — MISCELLANEOUS TEST

## 2010-08-19 NOTE — Discharge Summary (Signed)
  [name], [name]                  ACCOUNT NO.:  [id number]  MEDICAL RECORD NO.:  [id number]  LOCATION:  1417                         FACILITY:  [facility]  PHYSICIAN:  Ramiro Harvest, MD    DATE OF BIRTH:  03/12/1965  DATE OF ADMISSION:  07/15/2010 DATE OF DISCHARGE:  07/22/2010                        DISCHARGE SUMMARY    ADDENDUM:  This is an addendum to prior discharge summary done per Dr. Betti Cruz of job [id number].  PRIMARY CARE PHYSICIAN:  Simone Curia, MD; of [facility] in [place]; fax number there is #[phone].  DISCHARGE MEDICATIONS: 1. Medrol Dosepak 4 mg for 6 days. 2. Oxycodone 5 mg p.o. q.4 h. p.r.n. pain. 3. Lisinopril 20 mg p.o. daily. 4. Lovastatin 40 mg p.o. daily. 5. Metformin 850 mg p.o. t.i.d.  DISPOSITION AND FOLLOWUP:  The patient will be discharged home.  The patient is to follow up with PCP in 1-2 weeks post discharge.  On followup, the patient will likely need outpatient diabetes education aswell as further titer aggressive diabetes management as his A1c during this hospitalization was 10.2.  The patient may benefit from insulin therapy, but this will be deferred to the patient's PCP.  The patient is also to follow up with Dr. Elnoria Howard of Gastroenterology 2 weeks post discharge.  For the rest of the hospitalization, please see prior discharge summary dictated per Dr. Betti Cruz of job [id number].  This has been a pleasure taking care of [name].  The patient will be discharged home in stable and improved condition.     Ramiro Harvest, MD     DT/MEDQ  D:  07/22/2010  T:  07/22/2010  Job:  [id number]  cc:   Simone Curia, MD Fax: [phone]  Jordan Hawks. Elnoria Howard, MD Fax: [phone]  Juanetta Gosling, MD [address] [place] [address]  Electronically Signed by Ramiro Harvest MD on 08/19/2010 06:19:53 PM

## 2011-07-20 ENCOUNTER — Encounter (HOSPITAL_COMMUNITY): Payer: Self-pay | Admitting: *Deleted

## 2011-07-20 ENCOUNTER — Emergency Department (HOSPITAL_COMMUNITY): Payer: Managed Care, Other (non HMO)

## 2011-07-20 ENCOUNTER — Emergency Department (HOSPITAL_COMMUNITY)
Admission: EM | Admit: 2011-07-20 | Discharge: 2011-07-20 | Disposition: A | Discharge status: Home Health | Payer: Managed Care, Other (non HMO) | Attending: Emergency Medicine | Admitting: Emergency Medicine

## 2011-07-20 DIAGNOSIS — Z79899 Other long term (current) drug therapy: Secondary | ICD-10-CM | POA: Insufficient documentation

## 2011-07-20 DIAGNOSIS — M1710 Unilateral primary osteoarthritis, unspecified knee: Secondary | ICD-10-CM | POA: Insufficient documentation

## 2011-07-20 DIAGNOSIS — E119 Type 2 diabetes mellitus without complications: Secondary | ICD-10-CM | POA: Insufficient documentation

## 2011-07-20 DIAGNOSIS — M25569 Pain in unspecified knee: Secondary | ICD-10-CM | POA: Insufficient documentation

## 2011-07-20 DIAGNOSIS — I10 Essential (primary) hypertension: Secondary | ICD-10-CM | POA: Insufficient documentation

## 2011-07-20 HISTORY — DX: Essential (primary) hypertension: I10

## 2011-07-20 MED ORDER — DICLOFENAC SODIUM 75 MG PO TBEC: 75.0000 mg | DELAYED_RELEASE_TABLET | Freq: Two times a day (BID) | ORAL | Status: DC

## 2011-07-20 MED ORDER — TRAMADOL HCL 50 MG PO TABS: 50.0000 mg | ORAL_TABLET | Freq: Four times a day (QID) | ORAL | Status: AC | PRN

## 2011-07-20 MED ORDER — KETOROLAC TROMETHAMINE 60 MG/2ML IM SOLN
60.0000 mg | Freq: Once | INTRAMUSCULAR | Status: AC
  Administered 2011-07-20: 60 mg via INTRAMUSCULAR
  Filled 2011-07-20: qty 2

## 2011-07-20 NOTE — ED Provider Notes (Signed)
History     CSN: [id number]  Arrival date & time 07/20/11  1649   First MD Initiated Contact with Patient 07/20/11 2003      8:31 PM HPI Reports gradual increasing pain in his right knee. Reports last night he was at work his right knee "gave out" on him. Reports persistently worsening pain in right knee. States sensation that his knee will give out on him again. Points to pain bilaterally on patella. Pain with bending straining, walking  Patient is a 46 y.o. male presenting with knee pain. The history is provided by the patient.  Knee Pain This is a new problem. The current episode started in the past 7 days. The problem has been gradually worsening. Associated symptoms include joint swelling. Pertinent negatives include no chills, fever, numbness or weakness. The symptoms are aggravated by bending, standing and walking.    Past Medical History  Diagnosis Date  . Diabetes mellitus   . Hypertension     History reviewed. No pertinent past surgical history.  No family history on file.  History  Substance Use Topics  . Smoking status: Never Smoker   . Smokeless tobacco: Not on file  . Alcohol Use: No      Review of Systems  Constitutional: Negative for fever and chills.  Musculoskeletal: Positive for joint swelling. Negative for back pain.       Positive for knee pain  Neurological: Negative for weakness and numbness.  All other systems reviewed and are negative.    Allergies  Review of patient's allergies indicates no known allergies.  Home Medications   Current Outpatient Rx  Name Route Sig Dispense Refill  . GLIMEPIRIDE 4 MG PO TABS Oral Take 4 mg by mouth 2 (two) times daily.    Marland Kitchen LISINOPRIL 20 MG PO TABS Oral Take 20 mg by mouth daily.    Marland Kitchen LOVASTATIN 40 MG PO TABS Oral Take 40 mg by mouth at bedtime.    Marland Kitchen METFORMIN HCL 850 MG PO TABS Oral Take 850 mg by mouth 3 (three) times daily.      BP 124/79  Pulse 74  Temp 98.4 F (36.9 C) (Oral)  Resp 16  SpO2  100%  Physical Exam  Vitals reviewed. Constitutional: He is oriented to person, place, and time. He appears well-developed and well-nourished.  HENT:  Head: Normocephalic and atraumatic.  Eyes: Pupils are equal, round, and reactive to light.  Musculoskeletal:       Right knee: He exhibits normal range of motion, no swelling, no effusion, no deformity, no laceration, no erythema, normal alignment, no LCL laxity and normal patellar mobility. tenderness (entire knee was tender to palpation however patient had negative anterior, posterior, valgus, varus. Normal distal pulses. Normal sensation. normal patellar strength.,normal range of motion with tenderness.) found.       Legs: Neurological: He is alert and oriented to person, place, and time.  Skin: Skin is warm and dry. No rash noted. No erythema. No pallor.  Psychiatric: He has a normal mood and affect. His behavior is normal.    ED Course  Procedures  Dg Knee Complete 4 Views Right  07/20/2011  *RADIOLOGY REPORT*  Clinical Data: Knee pain  RIGHT KNEE - COMPLETE 4+ VIEW  Comparison: MRI 05/10/2006  Findings: Normal alignment and no fracture.  Mild irregularity of the patella due to osteoarthritis.  Bipartite patella.  Joint effusion is present. Medial and lateral joint spaces are normal.  IMPRESSION: Patellofemoral degenerative change with effusion.  No acute  abnormality.  Original Report Authenticated By: Camelia Phenes, M.D.      MDM   Patient has arthritis of right knee. Patient reports symptoms similar to the left knee that required operation several years ago and aspirin. Will place patient and in immobilizer and crutches and referred to orthopedic physician. Patient and spouse agree with plan and are ready for discharge       Thomasene Lot, Cordelia Poche 07/20/11 2037

## 2011-07-20 NOTE — ED Notes (Signed)
Ortho tech called and informed of knee immobilizer and crutches.

## 2011-07-20 NOTE — ED Notes (Signed)
Pt reports R knee pain since last night. Knee "gave out and has been hurting ever since." Denies known injury.

## 2011-07-20 NOTE — Discharge Instructions (Signed)
Osteoarthritis Osteoarthritis is the most common form of arthritis. It is redness, soreness, and swelling (inflammation) affecting the cartilage. Cartilage acts as a cushion, covering the ends of bones where they meet to form a joint. CAUSES  Over time, the cartilage begins to wear away. This causes bone to rub on bone. This produces pain and stiffness in the affected joints. Factors that contribute to this problem are:  Excessive body weight.   Age.   Overuse of joints.  SYMPTOMS   People with osteoarthritis usually experience joint pain, swelling, or stiffness.   Over time, the joint may lose its normal shape.   Small deposits of bone (osteophytes) may grow on the edges of the joint.   Bits of bone or cartilage can break off and float inside the joint space. This may cause more pain and damage.   Osteoarthritis can lead to depression, anxiety, feelings of helplessness, and limitations on daily activities.  The most commonly affected joints are in the:  Ends of the fingers.   Thumbs.   Neck.   Lower back.   Knees.   Hips.  DIAGNOSIS  Diagnosis is mostly based on your symptoms and exam. Tests may be helpful, including:  X-rays of the affected joint.   A computerized magnetic scan (MRI).   Blood tests to rule out other types of arthritis.   Joint fluid tests. This involves using a needle to draw fluid from the joint and examining the fluid under a microscope.  TREATMENT  Goals of treatment are to control pain, improve joint function, maintain a normal body weight, and maintain a healthy lifestyle. Treatment approaches may include:  A prescribed exercise program with rest and joint relief.   Weight control with nutritional education.   Pain relief techniques such as:   Properly applied heat and cold.   Electric pulses delivered to nerve endings under the skin (transcutaneous electrical nerve stimulation, TENS).   Massage.   Certain supplements. Ask your  caregiver before using any supplements, especially in combination with prescribed drugs.   Medicines to control pain, such as:   Acetaminophen.   Nonsteroidal anti-inflammatory drugs (NSAIDs), such as naproxen.   Narcotic or central-acting agents, such as tramadol. This drug carries a risk of addiction and is generally prescribed for short-term use.   Corticosteroids. These can be given orally or as injection. This is a short-term treatment, not recommended for routine use.   Surgery to reposition the bones and relieve pain (osteotomy) or to remove loose pieces of bone and cartilage. Joint replacement may be needed in advanced states of osteoarthritis.  HOME CARE INSTRUCTIONS  Your caregiver can recommend specific types of exercise. These may include:  Strengthening exercises. These are done to strengthen the muscles that support joints affected by arthritis. They can be performed with weights or with exercise bands to add resistance.   Aerobic activities. These are exercises, such as brisk walking or low-impact aerobics, that get your heart pumping. They can help keep your lungs and circulatory system in shape.   Range-of-motion activities. These keep your joints limber.   Balance and agility exercises. These help you maintain daily living skills.  Learning about your condition and being actively involved in your care will help improve the course of your osteoarthritis. SEEK MEDICAL CARE IF:   You feel hot or your skin turns red.   You develop a rash in addition to your joint pain.   You have an oral temperature above 102 F (38.9 C).  FOR   MORE INFORMATION  [employer] of Arthritis and Musculoskeletal and Skin Diseases: www.niams.http://www.myers.net/ [employer] on Aging: https://walker.com/ American College of Rheumatology: www.rheumatology.org Document Released: 01/18/2005 Document Revised: 01/07/2011 Document Reviewed: 05/01/2009 [facility] Patient Information 2012 [place],  [place].Knee Pain The knee is the complex joint between your thigh and your lower leg. It is made up of bones, tendons, ligaments, and cartilage. The bones that make up the knee are:  The femur in the thigh.   The tibia and fibula in the lower leg.   The patella or kneecap riding in the groove on the lower femur.  CAUSES  Knee pain is a common complaint with many causes. A few of these causes are:  Injury, such as:   A ruptured ligament or tendon injury.   Torn cartilage.   Medical conditions, such as:   Gout   Arthritis   Infections   Overuse, over training or overdoing a physical activity.  Knee pain can be minor or severe. Knee pain can accompany debilitating injury. Minor knee problems often respond well to self-care measures or get well on their own. More serious injuries may need medical intervention or even surgery. SYMPTOMS The knee is complex. Symptoms of knee problems can vary widely. Some of the problems are:  Pain with movement and weight bearing.   Swelling and tenderness.   Buckling of the knee.   Inability to straighten or extend your knee.   Your knee locks and you cannot straighten it.   Warmth and redness with pain and fever.   Deformity or dislocation of the kneecap.  DIAGNOSIS  Determining what is wrong may be very straight forward such as when there is an injury. It can also be challenging because of the complexity of the knee. Tests to make a diagnosis may include:  Your caregiver taking a history and doing a physical exam.   Routine X-rays can be used to rule out other problems. X-rays will not reveal a cartilage tear. Some injuries of the knee can be diagnosed by:   Arthroscopy a surgical technique by which a small video camera is inserted through tiny incisions on the sides of the knee. This procedure is used to examine and repair internal knee joint problems. Tiny instruments can be used during arthroscopy to repair the torn knee cartilage  (meniscus).   Arthrography is a radiology technique. A contrast liquid is directly injected into the knee joint. Internal structures of the knee joint then become visible on X-ray film.   An MRI scan is a non x-ray radiology procedure in which magnetic fields and a computer produce two- or three-dimensional images of the inside of the knee. Cartilage tears are often visible using an MRI scanner. MRI scans have largely replaced arthrography in diagnosing cartilage tears of the knee.   Blood work.   Examination of the fluid that helps to lubricate the knee joint (synovial fluid). This is done by taking a sample out using a needle and a syringe.  TREATMENT The treatment of knee problems depends on the cause. Some of these treatments are:  Depending on the injury, proper casting, splinting, surgery or physical therapy care will be needed.   Give yourself adequate recovery time. Do not overuse your joints. If you begin to get sore during workout routines, back off. Slow down or do fewer repetitions.   For repetitive activities such as cycling or running, maintain your strength and nutrition.   Alternate muscle groups. For example if you are a weight lifter, work  the upper body on one day and the lower body the next.   Either tight or weak muscles do not give the proper support for your knee. Tight or weak muscles do not absorb the stress placed on the knee joint. Keep the muscles surrounding the knee strong.   Take care of mechanical problems.   If you have flat feet, orthotics or special shoes may help. See your caregiver if you need help.   Arch supports, sometimes with wedges on the inner or outer aspect of the heel, can help. These can shift pressure away from the side of the knee most bothered by osteoarthritis.   A brace called an "unloader" brace also may be used to help ease the pressure on the most arthritic side of the knee.   If your caregiver has prescribed crutches, braces,  wraps or ice, use as directed. The acronym for this is PRICE. This means protection, rest, ice, compression and elevation.   Nonsteroidal anti-inflammatory drugs (NSAID's), can help relieve pain. But if taken immediately after an injury, they may actually increase swelling. Take NSAID's with food in your stomach. Stop them if you develop stomach problems. Do not take these if you have a history of ulcers, stomach pain or bleeding from the bowel. Do not take without your caregiver's approval if you have problems with fluid retention, heart failure, or kidney problems.   For ongoing knee problems, physical therapy may be helpful.   Glucosamine and chondroitin are over-the-counter dietary supplements. Both may help relieve the pain of osteoarthritis in the knee. These medicines are different from the usual anti-inflammatory drugs. Glucosamine may decrease the rate of cartilage destruction.   Injections of a corticosteroid drug into your knee joint may help reduce the symptoms of an arthritis flare-up. They may provide pain relief that lasts a few months. You may have to wait a few months between injections. The injections do have a small increased risk of infection, water retention and elevated blood sugar levels.   Hyaluronic acid injected into damaged joints may ease pain and provide lubrication. These injections may work by reducing inflammation. A series of shots may give relief for as long as 6 months.   Topical painkillers. Applying certain ointments to your skin may help relieve the pain and stiffness of osteoarthritis. Ask your pharmacist for suggestions. Many over the-counter products are approved for temporary relief of arthritis pain.   In some countries, doctors often prescribe topical NSAID's for relief of chronic conditions such as arthritis and tendinitis. A review of treatment with NSAID creams found that they worked as well as oral medications but without the serious side effects.    PREVENTION  Maintain a healthy weight. Extra pounds put more strain on your joints.   Get strong, stay limber. Weak muscles are a common cause of knee injuries. Stretching is important. Include flexibility exercises in your workouts.   Be smart about exercise. If you have osteoarthritis, chronic knee pain or recurring injuries, you may need to change the way you exercise. This does not mean you have to stop being active. If your knees ache after jogging or playing basketball, consider switching to swimming, water aerobics or other low-impact activities, at least for a few days a week. Sometimes limiting high-impact activities will provide relief.   Make sure your shoes fit well. Choose footwear that is right for your sport.   Protect your knees. Use the proper gear for knee-sensitive activities. Use kneepads when playing volleyball or laying carpet.  Buckle your seat belt every time you drive. Most shattered kneecaps occur in car accidents.   Rest when you are tired.  SEEK MEDICAL CARE IF:  You have knee pain that is continual and does not seem to be getting better.  SEEK IMMEDIATE MEDICAL CARE IF:  Your knee joint feels hot to the touch and you have a high fever. MAKE SURE YOU:   Understand these instructions.   Will watch your condition.   Will get help right away if you are not doing well or get worse.  Document Released: 11/15/2006 Document Revised: 01/07/2011 Document Reviewed: 11/15/2006 [facility] Patient Information 2012 [place], [place].

## 2011-07-21 NOTE — ED Provider Notes (Signed)
Medical screening examination/treatment/procedure(s) were performed by non-physician practitioner and as supervising physician I was immediately available for consultation/collaboration.   Kirby Cortese, MD 07/21/11 0011 

## 2012-02-04 ENCOUNTER — Ambulatory Visit (INDEPENDENT_AMBULATORY_CARE_PROVIDER_SITE_OTHER): Payer: Managed Care, Other (non HMO) | Admitting: Family Medicine

## 2012-02-04 ENCOUNTER — Emergency Department (HOSPITAL_COMMUNITY)
Admission: EM | Admit: 2012-02-04 | Discharge: 2012-02-04 | Discharge status: Absent without leave | Payer: Managed Care, Other (non HMO) | Source: Home / Self Care

## 2012-02-04 VITALS — BP 132/87 | HR 85 | Temp 98.0°F | Resp 17 | Ht 73.5 in | Wt 274.0 lb

## 2012-02-04 DIAGNOSIS — R51 Headache: Secondary | ICD-10-CM

## 2012-02-04 DIAGNOSIS — L0291 Cutaneous abscess, unspecified: Secondary | ICD-10-CM

## 2012-02-04 DIAGNOSIS — R202 Paresthesia of skin: Secondary | ICD-10-CM

## 2012-02-04 DIAGNOSIS — M542 Cervicalgia: Secondary | ICD-10-CM

## 2012-02-04 DIAGNOSIS — E119 Type 2 diabetes mellitus without complications: Secondary | ICD-10-CM

## 2012-02-04 DIAGNOSIS — R519 Headache, unspecified: Secondary | ICD-10-CM

## 2012-02-04 DIAGNOSIS — R209 Unspecified disturbances of skin sensation: Secondary | ICD-10-CM

## 2012-02-04 DIAGNOSIS — L0390 Cellulitis, unspecified: Secondary | ICD-10-CM

## 2012-02-04 LAB — COMPREHENSIVE METABOLIC PANEL WITH GFR
ALT: 16 U/L (ref 0–53)
AST: 12 U/L (ref 0–37)
Albumin: 4.3 g/dL (ref 3.5–5.2)
Alkaline Phosphatase: 70 U/L (ref 39–117)
BUN: 11 mg/dL (ref 6–23)
Calcium: 9.7 mg/dL (ref 8.4–10.5)
Chloride: 103 meq/L (ref 96–112)
Potassium: 4.4 meq/L (ref 3.5–5.3)
Sodium: 135 meq/L (ref 135–145)
Total Protein: 7.2 g/dL (ref 6.0–8.3)

## 2012-02-04 LAB — COMPREHENSIVE METABOLIC PANEL
CO2: 28 mEq/L (ref 19–32)
Creat: 1.08 mg/dL (ref 0.50–1.35)
Glucose, Bld: 266 mg/dL — ABNORMAL HIGH (ref 70–99)
Total Bilirubin: 0.3 mg/dL (ref 0.3–1.2)

## 2012-02-04 LAB — POCT CBC
Granulocyte percent: 45.4 % (ref 37–80)
HCT, POC: 46.7 % (ref 43.5–53.7)
Hemoglobin: 14.9 g/dL (ref 14.1–18.1)
Lymph, poc: 3.5 — AB (ref 0.6–3.4)
MCH, POC: 28 pg (ref 27–31.2)
MCHC: 31.9 g/dL (ref 31.8–35.4)
MCV: 87.6 fL (ref 80–97)
MID (cbc): 0.4 (ref 0–0.9)
MPV: 9.3 fL (ref 0–99.8)
POC Granulocyte: 3.3 (ref 2–6.9)
POC LYMPH PERCENT: 49.1 %L (ref 10–50)
POC MID %: 5.5 % (ref 0–12)
Platelet Count, POC: 292 10*3/uL (ref 142–424)
RBC: 5.33 M/uL (ref 4.69–6.13)
RDW, POC: 15.4 %
WBC: 7.2 10*3/uL (ref 4.6–10.2)

## 2012-02-04 LAB — POCT GLYCOSYLATED HEMOGLOBIN (HGB A1C): Hemoglobin A1C: 12.3

## 2012-02-04 LAB — POCT SEDIMENTATION RATE: POCT SED RATE: 42 mm/h — AB (ref 0–22)

## 2012-02-04 MED ORDER — DOXYCYCLINE HYCLATE 100 MG PO TABS: 100.0000 mg | ORAL_TABLET | Freq: Two times a day (BID) | ORAL | Status: DC

## 2012-02-04 MED ORDER — OXYCODONE-ACETAMINOPHEN 5-325 MG PO TABS: 1.0000 | ORAL_TABLET | Freq: Four times a day (QID) | ORAL | Status: DC | PRN

## 2012-02-04 MED ORDER — KETOROLAC TROMETHAMINE 60 MG/2ML IM SOLN
60.0000 mg | Freq: Once | INTRAMUSCULAR | Status: AC
  Administered 2012-02-04: 60 mg via INTRAMUSCULAR

## 2012-02-04 NOTE — Progress Notes (Signed)
[facility]:  Office Visit  Chief Complaint:  Chief Complaint  Patient presents with  . Head Injury    pain on back off head     HPI: [name] is a 47 y.o. male who complains of: 1.  3 week history of knot on back of his head, started getting painful about 2 weeks ago, has had fever, chills, and left eye has been blurred vision and cloudy, no dc from wounds in back of head. Associated with left sided headache, HA is constant , sharp. Also has had RUQ abd pain. Denies any CP or SOB. Denies nausea or vomiting. Had right knee arthroscopy by Dr. Cleophas Dunker August 2013. No other surgeries.  2. Diabetes when he checks his sugars is in 300-350s. Has had blurred vision. +numbness in thumb. States he is compliant with medicines  Past Medical History  Diagnosis Date  . Diabetes mellitus   . Hypertension    History reviewed. No pertinent past surgical history. History   Social History  . Marital Status: Divorced    Spouse Name: N/A    Number of Children: N/A  . Years of Education: N/A   Social History Main Topics  . Smoking status: Never Smoker   . Smokeless tobacco: None  . Alcohol Use: No  . Drug Use: No  . Sexually Active: Yes    Birth Control/ Protection: None   Other Topics Concern  . None   Social History Narrative  . None   No family history on file. No Known Allergies Prior to Admission medications   Medication Sig Start Date End Date Taking? Authorizing Provider  glimepiride (AMARYL) 4 MG tablet Take 4 mg by mouth 2 (two) times daily.   Yes Historical Provider, MD  lisinopril (PRINIVIL,ZESTRIL) 20 MG tablet Take 20 mg by mouth daily.   Yes Historical Provider, MD  lovastatin (MEVACOR) 40 MG tablet Take 40 mg by mouth at bedtime.   Yes Historical Provider, MD  metFORMIN (GLUCOPHAGE) 850 MG tablet Take 850 mg by mouth 3 (three) times daily.   Yes Historical Provider, MD  diclofenac (VOLTAREN) 75 MG EC tablet Take 1 tablet (75 mg total) by mouth 2  (two) times daily. 07/20/11 07/19/12  Thomasene Lot, PA-C     ROS: The patient denies night sweats, unintentional weight loss, chest pain, palpitations, wheezing, dyspnea on exertion, nausea, vomiting, abdominal pain, dysuria, hematuria, melena.  All other systems have been reviewed and were otherwise negative with the exception of those mentioned in the HPI and as above.    PHYSICAL EXAM: Filed Vitals:   02/04/12 1520  BP: 132/87  Pulse: 85  Temp: 98 F (36.7 C)  Resp: 17   Filed Vitals:   02/04/12 1520  Height: 6' 1.5" (1.867 m)  Weight: 274 lb (124.286 kg)   Body mass index is 35.66 kg/(m^2).  General: Alert, no acute distress HEENT:  Normocephalic, atraumatic, oropharynx patent. TM nl. EOMI, PERRLA.  Cardiovascular:  Regular rate and rhythm, no rubs murmurs or gallops.  No Carotid bruits, radial pulse intact. No pedal edema.  Respiratory: Clear to auscultation bilaterally.  No wheezes, rales, or rhonchi.  No cyanosis, no use of accessory musculature GI: No organomegaly, abdomen is soft and non-tender, positive bowel sounds.  No masses. Skin: + multiple folliculitis rashes on back of head, occiput area, very fluctuant area. From occiput to top of neck primarily on left side.  Neurologic: Facial musculature symmetric. Psychiatric: Patient is appropriate throughout our interaction. Lymphatic: No  cervical lymphadenopathy Musculoskeletal: Gait intact. Decrease ROM, nuchal rigidity.    LABS: Results for orders placed in visit on 02/04/12  POCT CBC      Component Value Range   WBC 7.2  4.6 - 10.2 K/uL   Lymph, poc 3.5 (*) 0.6 - 3.4   POC LYMPH PERCENT 49.1  10 - 50 %L   MID (cbc) 0.4  0 - 0.9   POC MID % 5.5  0 - 12 %M   POC Granulocyte 3.3  2 - 6.9   Granulocyte percent 45.4  37 - 80 %G   RBC 5.33  4.69 - 6.13 M/uL   Hemoglobin 14.9  14.1 - 18.1 g/dL   HCT, POC 16.1  09.6 - 53.7 %   MCV 87.6  80 - 97 fL   MCH, POC 28.0  27 - 31.2 pg   MCHC 31.9  31.8 - 35.4 g/dL    RDW, POC 04.5     Platelet Count, POC 292  142 - 424 K/uL   MPV 9.3  0 - 99.8 fL  POCT GLYCOSYLATED HEMOGLOBIN (HGB A1C)      Component Value Range   Hemoglobin A1C 12.3       EKG/XRAY:   Primary read interpreted by Dr. Conley Rolls at [facility].   ASSESSMENT/PLAN: Encounter Diagnoses  Name Primary?  . Acute neck pain Yes  . Head and face pain   . Paresthesia of hand   . Diabetes   . Abscess and cellulitis     Did not get much out from I&D or aspiration  Able to get wound culture and minimally pack Will put patient on Doxycycline Patient given Toradol IM and Percocet for pain Will return in AM for echeck Poorly controlled DM , patient states he is taking meds but I am not sure Parasethesia may be from DM.  Go to ER prn,,f/u tomorrow    Rockne Coons, DO 02/04/2012 5:21 PM

## 2012-02-04 NOTE — Progress Notes (Signed)
Procedure Note: Verbal consent obtained.  Local anesthesia with 4 cc 1% lidocaine with epinephrine.  Attempted to aspirate the more superior area of fluctuance without success.  Suspect that this could be a lipoma as pt states he has had this for some time.  He is not as tender in this area.   Removed a crusted area from the lesion more distal on the left side of the scalp.  Small amount of purulence expressed.  Extended this Incision with an 11 blade.  Minimal purulence expressed.  Culture collected.  Packed with 1/4 inch plain packing.  Cleansed and dressed.  Pt tolerated the procedure well.    Porfirio Oar PA-C assisted in this procedure

## 2012-02-05 ENCOUNTER — Ambulatory Visit (INDEPENDENT_AMBULATORY_CARE_PROVIDER_SITE_OTHER): Payer: Managed Care, Other (non HMO) | Admitting: Family Medicine

## 2012-02-05 VITALS — BP 126/79 | HR 85 | Temp 97.9°F | Resp 16 | Ht 73.5 in | Wt 275.9 lb

## 2012-02-05 DIAGNOSIS — L0291 Cutaneous abscess, unspecified: Secondary | ICD-10-CM

## 2012-02-05 NOTE — Progress Notes (Signed)
[facility]:  Office Visit  Chief Complaint:  Chief Complaint  Patient presents with  . Wound Check    HPI: [name] is a 47 y.o. male who complains of folliculitis and cellulitis on head, s/p I&D 1 day.  Here for wound recheck. Patient is not much better. Still has HA but slightly less. Taking Doxycycline and Percocet. Deneis fevers, chills, worsening cellulitis.   Past Medical History  Diagnosis Date  . Diabetes mellitus   . Hypertension    No past surgical history on file. History   Social History  . Marital Status: Divorced    Spouse Name: N/A    Number of Children: N/A  . Years of Education: N/A   Social History Main Topics  . Smoking status: Never Smoker   . Smokeless tobacco: None  . Alcohol Use: No  . Drug Use: No  . Sexually Active: Yes    Birth Control/ Protection: None   Other Topics Concern  . None   Social History Narrative  . None   No family history on file. No Known Allergies Prior to Admission medications   Medication Sig Start Date End Date Taking? Authorizing Provider  doxycycline (VIBRA-TABS) 100 MG tablet Take 1 tablet (100 mg total) by mouth 2 (two) times daily. 02/04/12  Yes Zeinab Rodwell P Dijon Cosens, DO  glimepiride (AMARYL) 4 MG tablet Take 4 mg by mouth 2 (two) times daily.   Yes Historical Provider, MD  lisinopril (PRINIVIL,ZESTRIL) 20 MG tablet Take 20 mg by mouth daily.   Yes Historical Provider, MD  lovastatin (MEVACOR) 40 MG tablet Take 40 mg by mouth at bedtime.   Yes Historical Provider, MD  metFORMIN (GLUCOPHAGE) 850 MG tablet Take 850 mg by mouth 3 (three) times daily.   Yes Historical Provider, MD  oxyCODONE-acetaminophen (PERCOCET) 5-325 MG per tablet Take 1 tablet by mouth every 6 (six) hours as needed for pain. 02/04/12  Yes Anadelia Kintz P Jeidy Hoerner, DO     ROS: The patient denies fevers, chills, night sweats, unintentional weight loss, chest pain, palpitations, wheezing, dyspnea on exertion, nausea, vomiting, abdominal pain, dysuria,  hematuria, melena, numbness, weakness, or tingling.   All other systems have been reviewed and were otherwise negative with the exception of those mentioned in the HPI and as above.    PHYSICAL EXAM: Filed Vitals:   02/05/12 1056  BP: 126/79  Pulse: 85  Temp: 97.9 F (36.6 C)  Resp: 16   Filed Vitals:   02/05/12 1056  Height: 6' 1.5" (1.867 m)  Weight: 275 lb 14.4 oz (125.147 kg)   Body mass index is 35.91 kg/(m^2).  General: Alert, no acute distress HEENT:  Normocephalic, atraumatic, oropharynx patent.  Cardiovascular:  Regular rate and rhythm, no rubs murmurs or gallops.  No Carotid bruits, radial pulse intact. No pedal edema.  Respiratory: Clear to auscultation bilaterally.  No wheezes, rales, or rhonchi.  No cyanosis, no use of accessory musculature GI: No organomegaly, abdomen is soft and non-tender, positive bowel sounds.  No masses. Skin:+ folliculitis with cellulitis and abscess, same as before Neurologic: Facial musculature symmetric. Psychiatric: Patient is appropriate throughout our interaction. Lymphatic: No cervical lymphadenopathy Musculoskeletal: Gait intact.   LABS: Results for orders placed in visit on 02/04/12  POCT CBC      Component Value Range   WBC 7.2  4.6 - 10.2 K/uL   Lymph, poc 3.5 (*) 0.6 - 3.4   POC LYMPH PERCENT 49.1  10 - 50 %L   MID (cbc)  0.4  0 - 0.9   POC MID % 5.5  0 - 12 %M   POC Granulocyte 3.3  2 - 6.9   Granulocyte percent 45.4  37 - 80 %G   RBC 5.33  4.69 - 6.13 M/uL   Hemoglobin 14.9  14.1 - 18.1 g/dL   HCT, POC 78.2  95.6 - 53.7 %   MCV 87.6  80 - 97 fL   MCH, POC 28.0  27 - 31.2 pg   MCHC 31.9  31.8 - 35.4 g/dL   RDW, POC 21.3     Platelet Count, POC 292  142 - 424 K/uL   MPV 9.3  0 - 99.8 fL  POCT SEDIMENTATION RATE      Component Value Range   POCT SED RATE 42 (*) 0 - 22 mm/hr  COMPREHENSIVE METABOLIC PANEL      Component Value Range   Sodium 135  135 - 145 mEq/L   Potassium 4.4  3.5 - 5.3 mEq/L   Chloride 103  96  - 112 mEq/L   CO2 28  19 - 32 mEq/L   Glucose, Bld 266 (*) 70 - 99 mg/dL   BUN 11  6 - 23 mg/dL   Creat 0.86  5.78 - 4.69 mg/dL   Total Bilirubin 0.3  0.3 - 1.2 mg/dL   Alkaline Phosphatase 70  39 - 117 U/L   AST 12  0 - 37 U/L   ALT 16  0 - 53 U/L   Total Protein 7.2  6.0 - 8.3 g/dL   Albumin 4.3  3.5 - 5.2 g/dL   Calcium 9.7  8.4 - 62.9 mg/dL  POCT GLYCOSYLATED HEMOGLOBIN (HGB A1C)      Component Value Range   Hemoglobin A1C 12.3       EKG/XRAY:   Primary read interpreted by Dr. Conley Rolls at [facility].   ASSESSMENT/PLAN: Encounter Diagnosis  Name Primary?  Marland Kitchen Abscess and cellulitis Yes   Still the same, pain is slightly less so HA is not as intense Continue with medication. OV no charge since this is a f/u for I&D F/u on Monday 02/07/2012.  Go to Er prn    Avyukth Bontempo PHUONG, DO 02/05/2012 11:44 AM

## 2012-02-07 ENCOUNTER — Ambulatory Visit (INDEPENDENT_AMBULATORY_CARE_PROVIDER_SITE_OTHER): Payer: Managed Care, Other (non HMO) | Admitting: Family Medicine

## 2012-02-07 VITALS — BP 140/87 | HR 90 | Temp 98.3°F | Resp 18 | Ht 73.5 in | Wt 272.0 lb

## 2012-02-07 DIAGNOSIS — L0291 Cutaneous abscess, unspecified: Secondary | ICD-10-CM

## 2012-02-07 DIAGNOSIS — L0390 Cellulitis, unspecified: Secondary | ICD-10-CM

## 2012-02-07 LAB — WOUND CULTURE
Gram Stain: NONE SEEN
Gram Stain: NONE SEEN

## 2012-02-07 NOTE — Progress Notes (Signed)
[facility]:  Office Visit  Chief Complaint:  Chief Complaint  Patient presents with  . Wound Check    back of head    HPI: [name] is a 47 y.o. male who complains of  Recheck abscess/cellulitis. Doing much better.   Past Medical History  Diagnosis Date  . Diabetes mellitus   . Hypertension    No past surgical history on file. History   Social History  . Marital Status: Divorced    Spouse Name: N/A    Number of Children: N/A  . Years of Education: N/A   Social History Main Topics  . Smoking status: Never Smoker   . Smokeless tobacco: None  . Alcohol Use: No  . Drug Use: No  . Sexually Active: Yes    Birth Control/ Protection: None   Other Topics Concern  . None   Social History Narrative  . None   No family history on file. No Known Allergies Prior to Admission medications   Medication Sig Start Date End Date Taking? Authorizing Provider  doxycycline (VIBRA-TABS) 100 MG tablet Take 1 tablet (100 mg total) by mouth 2 (two) times daily. 02/04/12  Yes Thao P Le, DO  glimepiride (AMARYL) 4 MG tablet Take 4 mg by mouth 2 (two) times daily.   Yes Historical Provider, MD  lisinopril (PRINIVIL,ZESTRIL) 20 MG tablet Take 20 mg by mouth daily.   Yes Historical Provider, MD  lovastatin (MEVACOR) 40 MG tablet Take 40 mg by mouth at bedtime.   Yes Historical Provider, MD  metFORMIN (GLUCOPHAGE) 850 MG tablet Take 850 mg by mouth 3 (three) times daily.   Yes Historical Provider, MD  oxyCODONE-acetaminophen (PERCOCET) 5-325 MG per tablet Take 1 tablet by mouth every 6 (six) hours as needed for pain. 02/04/12  Yes Thao P Le, DO     ROS: The patient denies fevers, chills, night sweats, unintentional weight loss, chest pain, palpitations, wheezing, dyspnea on exertion, nausea, vomiting, abdominal pain, dysuria, hematuria, melena, numbness, weakness, or tingling.  All other systems have been reviewed and were otherwise negative with the exception of those  mentioned in the HPI and as above.    PHYSICAL EXAM: Filed Vitals:   02/07/12 1049  BP: 140/87  Pulse: 90  Temp: 98.3 F (36.8 C)  Resp: 18   Filed Vitals:   02/07/12 1049  Height: 6' 1.5" (1.867 m)  Weight: 272 lb (123.378 kg)   Body mass index is 35.40 kg/(m^2).  General: Alert, no acute distress HEENT:  Normocephalic, atraumatic, oropharynx patent.  Cardiovascular:  Regular rate and rhythm, no rubs murmurs or gallops.  No Carotid bruits, radial pulse intact. No pedal edema.  Respiratory: Clear to auscultation bilaterally.  No wheezes, rales, or rhonchi.  No cyanosis, no use of accessory musculature GI: No organomegaly, abdomen is soft and non-tender, positive bowel sounds.  No masses. Skin: + rash on back of head, improved.  Neurologic: Facial musculature symmetric. Psychiatric: Patient is appropriate throughout our interaction. Lymphatic: No cervical lymphadenopathy Musculoskeletal: Gait intact.    LABS: Results for orders placed in visit on 02/04/12  POCT CBC      Component Value Range   WBC 7.2  4.6 - 10.2 K/uL   Lymph, poc 3.5 (*) 0.6 - 3.4   POC LYMPH PERCENT 49.1  10 - 50 %L   MID (cbc) 0.4  0 - 0.9   POC MID % 5.5  0 - 12 %M   POC Granulocyte 3.3  2 -  6.9   Granulocyte percent 45.4  37 - 80 %G   RBC 5.33  4.69 - 6.13 M/uL   Hemoglobin 14.9  14.1 - 18.1 g/dL   HCT, POC 21.3  08.6 - 53.7 %   MCV 87.6  80 - 97 fL   MCH, POC 28.0  27 - 31.2 pg   MCHC 31.9  31.8 - 35.4 g/dL   RDW, POC 57.8     Platelet Count, POC 292  142 - 424 K/uL   MPV 9.3  0 - 99.8 fL  POCT SEDIMENTATION RATE      Component Value Range   POCT SED RATE 42 (*) 0 - 22 mm/hr  COMPREHENSIVE METABOLIC PANEL      Component Value Range   Sodium 135  135 - 145 mEq/L   Potassium 4.4  3.5 - 5.3 mEq/L   Chloride 103  96 - 112 mEq/L   CO2 28  19 - 32 mEq/L   Glucose, Bld 266 (*) 70 - 99 mg/dL   BUN 11  6 - 23 mg/dL   Creat 4.69  6.29 - 5.28 mg/dL   Total Bilirubin 0.3  0.3 - 1.2 mg/dL    Alkaline Phosphatase 70  39 - 117 U/L   AST 12  0 - 37 U/L   ALT 16  0 - 53 U/L   Total Protein 7.2  6.0 - 8.3 g/dL   Albumin 4.3  3.5 - 5.2 g/dL   Calcium 9.7  8.4 - 41.3 mg/dL  POCT GLYCOSYLATED HEMOGLOBIN (HGB A1C)      Component Value Range   Hemoglobin A1C 12.3    WOUND CULTURE      Component Value Range   Culture Moderate STAPHYLOCOCCUS AUREUS     GRAM STAIN No WBC Seen     GRAM STAIN No Squamous Epithelial Cells Seen     GRAM STAIN Few GRAM POSITIVE COCCI IN CLUSTERS     Organism ID, Bacteria STAPHYLOCOCCUS AUREUS       EKG/XRAY:   Primary read interpreted by Dr. Conley Rolls at [facility].   ASSESSMENT/PLAN: Encounter Diagnosis  Name Primary?  . Cellulitis and abscess Yes   Improving cellulitis, fluid wave is less, less warmth and swelling, and erythema. Patient feels better which is the most important part. HA are gone. Repacked area F/u on Saturday May go back to work, keep area clean, continue with warm compresses    LE, THAO PHUONG, DO 02/08/2012 11:46 AM

## 2012-02-12 ENCOUNTER — Ambulatory Visit (INDEPENDENT_AMBULATORY_CARE_PROVIDER_SITE_OTHER): Payer: Managed Care, Other (non HMO) | Admitting: Family Medicine

## 2012-02-12 VITALS — BP 113/77 | HR 86 | Temp 98.8°F | Resp 17 | Ht 73.5 in | Wt 271.0 lb

## 2012-02-12 DIAGNOSIS — L738 Other specified follicular disorders: Secondary | ICD-10-CM

## 2012-02-12 DIAGNOSIS — L218 Other seborrheic dermatitis: Secondary | ICD-10-CM

## 2012-02-12 DIAGNOSIS — L219 Seborrheic dermatitis, unspecified: Secondary | ICD-10-CM

## 2012-02-12 DIAGNOSIS — L0390 Cellulitis, unspecified: Secondary | ICD-10-CM

## 2012-02-12 DIAGNOSIS — B350 Tinea barbae and tinea capitis: Secondary | ICD-10-CM

## 2012-02-12 DIAGNOSIS — L0291 Cutaneous abscess, unspecified: Secondary | ICD-10-CM

## 2012-02-12 MED ORDER — DOXYCYCLINE HYCLATE 100 MG PO TABS: 100.0000 mg | ORAL_TABLET | Freq: Two times a day (BID) | ORAL | Status: DC

## 2012-02-12 MED ORDER — KETOCONAZOLE 2 % EX SHAM: MEDICATED_SHAMPOO | CUTANEOUS | Status: DC

## 2012-02-12 NOTE — Progress Notes (Signed)
[facility]:  Office Visit  Chief Complaint:  Chief Complaint  Patient presents with  . Follow-up    wound care     HPI: [name] is a 47 y.o. male who complains of  Recheck for wound on head due to follicultis/abscess s/p I and D. Patient doing well. No  pain with neck ROM.   Past Medical History  Diagnosis Date  . Diabetes mellitus   . Hypertension    No past surgical history on file. History   Social History  . Marital Status: Divorced    Spouse Name: N/A    Number of Children: N/A  . Years of Education: N/A   Social History Main Topics  . Smoking status: Never Smoker   . Smokeless tobacco: None  . Alcohol Use: No  . Drug Use: No  . Sexually Active: Yes    Birth Control/ Protection: None   Other Topics Concern  . None   Social History Narrative  . None   No family history on file. No Known Allergies Prior to Admission medications   Medication Sig Start Date End Date Taking? Authorizing Provider  doxycycline (VIBRA-TABS) 100 MG tablet Take 1 tablet (100 mg total) by mouth 2 (two) times daily. 02/04/12  Yes Cicely Ortner P Tatsuo Musial, DO  glimepiride (AMARYL) 4 MG tablet Take 4 mg by mouth 2 (two) times daily.   Yes Historical Provider, MD  lisinopril (PRINIVIL,ZESTRIL) 20 MG tablet Take 20 mg by mouth daily.   Yes Historical Provider, MD  lovastatin (MEVACOR) 40 MG tablet Take 40 mg by mouth at bedtime.   Yes Historical Provider, MD  metFORMIN (GLUCOPHAGE) 850 MG tablet Take 850 mg by mouth 3 (three) times daily.   Yes Historical Provider, MD  oxyCODONE-acetaminophen (PERCOCET) 5-325 MG per tablet Take 1 tablet by mouth every 6 (six) hours as needed for pain. 02/04/12  Yes Iszabella Hebenstreit P Alroy Portela, DO     ROS: The patient denies fevers, chills, night sweats, unintentional weight loss, chest pain, palpitations, wheezing, dyspnea on exertion, nausea, vomiting, abdominal pain, dysuria, hematuria, melena, numbness, weakness, or tingling.   All other systems have been reviewed  and were otherwise negative with the exception of those mentioned in the HPI and as above.    PHYSICAL EXAM: Filed Vitals:   02/12/12 1058  BP: 113/77  Pulse: 108  Temp: 98.8 F (37.1 C)  Resp: 17   Filed Vitals:   02/12/12 1058  Height: 6' 1.5" (1.867 m)  Weight: 271 lb (122.925 kg)   Body mass index is 35.27 kg/(m^2).  General: Alert, no acute distress HEENT:  Normocephalic, atraumatic, oropharynx patent.  Cardiovascular:  Regular rate and rhythm, no rubs murmurs or gallops.  No Carotid bruits, radial pulse intact. No pedal edema.  Respiratory: Clear to auscultation bilaterally.  No wheezes, rales, or rhonchi.  No cyanosis, no use of accessory musculature GI: No organomegaly, abdomen is soft and non-tender, positive bowel sounds.  No masses. Skin: No rashes. Neurologic: Facial musculature symmetric. Psychiatric: Patient is appropriate throughout our interaction. Lymphatic: No cervical lymphadenopathy Musculoskeletal: Gait intact.   LABS: Results for orders placed in visit on 02/04/12  POCT CBC      Component Value Range   WBC 7.2  4.6 - 10.2 K/uL   Lymph, poc 3.5 (*) 0.6 - 3.4   POC LYMPH PERCENT 49.1  10 - 50 %L   MID (cbc) 0.4  0 - 0.9   POC MID % 5.5  0 - 12 %  M   POC Granulocyte 3.3  2 - 6.9   Granulocyte percent 45.4  37 - 80 %G   RBC 5.33  4.69 - 6.13 M/uL   Hemoglobin 14.9  14.1 - 18.1 g/dL   HCT, POC 16.1  09.6 - 53.7 %   MCV 87.6  80 - 97 fL   MCH, POC 28.0  27 - 31.2 pg   MCHC 31.9  31.8 - 35.4 g/dL   RDW, POC 04.5     Platelet Count, POC 292  142 - 424 K/uL   MPV 9.3  0 - 99.8 fL  POCT SEDIMENTATION RATE      Component Value Range   POCT SED RATE 42 (*) 0 - 22 mm/hr  COMPREHENSIVE METABOLIC PANEL      Component Value Range   Sodium 135  135 - 145 mEq/L   Potassium 4.4  3.5 - 5.3 mEq/L   Chloride 103  96 - 112 mEq/L   CO2 28  19 - 32 mEq/L   Glucose, Bld 266 (*) 70 - 99 mg/dL   BUN 11  6 - 23 mg/dL   Creat 4.09  8.11 - 9.14 mg/dL   Total  Bilirubin 0.3  0.3 - 1.2 mg/dL   Alkaline Phosphatase 70  39 - 117 U/L   AST 12  0 - 37 U/L   ALT 16  0 - 53 U/L   Total Protein 7.2  6.0 - 8.3 g/dL   Albumin 4.3  3.5 - 5.2 g/dL   Calcium 9.7  8.4 - 78.2 mg/dL  POCT GLYCOSYLATED HEMOGLOBIN (HGB A1C)      Component Value Range   Hemoglobin A1C 12.3    WOUND CULTURE      Component Value Range   Culture Moderate STAPHYLOCOCCUS AUREUS     GRAM STAIN No WBC Seen     GRAM STAIN No Squamous Epithelial Cells Seen     GRAM STAIN Few GRAM POSITIVE COCCI IN CLUSTERS     Organism ID, Bacteria STAPHYLOCOCCUS AUREUS       EKG/XRAY:   Primary read interpreted by Dr. Conley Rolls at [facility].   ASSESSMENT/PLAN: Encounter Diagnoses  Name Primary?  . Cellulitis Yes  . Seborrheic dermatitis of scalp   . Folliculitis barbae   . Abscess and cellulitis     Nizarol Shampoo twice weekly C/w  4 more days of Doxycycline for a total of 14 days F/u prn or in 2 months for T2DM.   Anthony Victory PHUONG, DO 02/12/2012 11:10 AM

## 2013-07-02 ENCOUNTER — Emergency Department (HOSPITAL_COMMUNITY)
Admission: EM | Admit: 2013-07-02 | Discharge: 2013-07-03 | Disposition: A | Discharge status: Home / Self Care | Payer: Managed Care, Other (non HMO) | Attending: Emergency Medicine | Admitting: Emergency Medicine

## 2013-07-02 ENCOUNTER — Encounter (HOSPITAL_COMMUNITY): Payer: Self-pay | Admitting: Emergency Medicine

## 2013-07-02 DIAGNOSIS — I10 Essential (primary) hypertension: Secondary | ICD-10-CM | POA: Insufficient documentation

## 2013-07-02 DIAGNOSIS — F411 Generalized anxiety disorder: Secondary | ICD-10-CM | POA: Insufficient documentation

## 2013-07-02 DIAGNOSIS — E119 Type 2 diabetes mellitus without complications: Secondary | ICD-10-CM | POA: Insufficient documentation

## 2013-07-02 DIAGNOSIS — H538 Other visual disturbances: Secondary | ICD-10-CM | POA: Insufficient documentation

## 2013-07-02 DIAGNOSIS — R739 Hyperglycemia, unspecified: Secondary | ICD-10-CM

## 2013-07-02 DIAGNOSIS — Z79899 Other long term (current) drug therapy: Secondary | ICD-10-CM | POA: Insufficient documentation

## 2013-07-02 LAB — CBC
HCT: 45.2 % (ref 39.0–52.0)
Hemoglobin: 16.2 g/dL (ref 13.0–17.0)
MCH: 29.6 pg (ref 26.0–34.0)
MCHC: 35.8 g/dL (ref 30.0–36.0)
MCV: 82.6 fL (ref 78.0–100.0)
Platelets: 190 10*3/uL (ref 150–400)
RBC: 5.47 MIL/uL (ref 4.22–5.81)
RDW: 12.8 % (ref 11.5–15.5)
WBC: 6.9 10*3/uL (ref 4.0–10.5)

## 2013-07-02 LAB — COMPREHENSIVE METABOLIC PANEL
ALT: 30 U/L (ref 0–53)
AST: 16 U/L (ref 0–37)
Albumin: 3.8 g/dL (ref 3.5–5.2)
Alkaline Phosphatase: 93 U/L (ref 39–117)
BUN: 14 mg/dL (ref 6–23)
CO2: 21 mEq/L (ref 19–32)
Calcium: 10.3 mg/dL (ref 8.4–10.5)
Chloride: 94 mEq/L — ABNORMAL LOW (ref 96–112)
Creatinine, Ser: 0.94 mg/dL (ref 0.50–1.35)
GFR calc Af Amer: >90 mL/min (ref >90)
GFR calc non Af Amer: >90 mL/min (ref >90)
Glucose, Bld: 440 mg/dL — ABNORMAL HIGH (ref 70–99)
Potassium: 4.5 mEq/L (ref 3.7–5.3)
Sodium: 129 mEq/L — ABNORMAL LOW (ref 137–147)
Total Bilirubin: 0.2 mg/dL — ABNORMAL LOW (ref 0.3–1.2)
Total Protein: 7.6 g/dL (ref 6.0–8.3)

## 2013-07-02 LAB — URINALYSIS, ROUTINE W REFLEX MICROSCOPIC
Bilirubin Urine: NEGATIVE
Glucose, UA: >1000 mg/dL — AB
Hgb urine dipstick: NEGATIVE
Ketones, ur: NEGATIVE mg/dL
Leukocytes, UA: NEGATIVE
Nitrite: NEGATIVE
Protein, ur: NEGATIVE mg/dL
Specific Gravity, Urine: 1.036 — ABNORMAL HIGH (ref 1.005–1.030)
Urobilinogen, UA: 0.2 mg/dL (ref 0.0–1.0)
pH: 5.5 (ref 5.0–8.0)

## 2013-07-02 LAB — CBG MONITORING, ED
GLUCOSE-CAPILLARY: 308 mg/dL — AB (ref 70–99)
Glucose-Capillary: 468 mg/dL — ABNORMAL HIGH (ref 70–99)

## 2013-07-02 LAB — URINE MICROSCOPIC-ADD ON

## 2013-07-02 MED ORDER — INSULIN ASPART 100 UNIT/ML ~~LOC~~ SOLN
10.0000 [IU] | Freq: Once | SUBCUTANEOUS | Status: AC
  Administered 2013-07-02: 10 [IU] via INTRAVENOUS
  Filled 2013-07-02: qty 1

## 2013-07-02 MED ORDER — SODIUM CHLORIDE 0.9 % IV BOLUS (SEPSIS)
1000.0000 mL | Freq: Once | INTRAVENOUS | Status: AC
  Administered 2013-07-02: 1000 mL via INTRAVENOUS

## 2013-07-02 MED ORDER — LORAZEPAM 2 MG/ML IJ SOLN
1.0000 mg | Freq: Once | INTRAMUSCULAR | Status: AC
  Administered 2013-07-02: 1 mg via INTRAVENOUS
  Filled 2013-07-02: qty 1

## 2013-07-02 NOTE — ED Provider Notes (Signed)
CSN: [id number]     Arrival date & time 07/02/13  1928 History   First MD Initiated Contact with Patient 07/02/13 2048     Chief Complaint  Patient presents with  . Hyperglycemia     (Consider location/radiation/quality/duration/timing/severity/associated sxs/prior Treatment) HPI Comments: Patient is a 48 year old male past medical history significant for DM, hypertension presenting to the emergency department for hyperglycemia. Patient reports being out of his metformin for the last 3 months, stating his primary care physician won't refill this prescription without a visit. Patient states he had cloudy vision and did not feel well objective blood sugar and noted to be 558. Patient endorses associated polyuria, polydipsia. He denies any nausea, vomiting, diarrhea. Denies any hospitalizations for hyperglycemia. Patient states he normally checks his blood sugar once every week or 2. He is only on metformin for blood sugar control at this time. Patient is scheduled to follow up with his PCP on Thursday.   Past Medical History  Diagnosis Date  . Diabetes mellitus   . Hypertension    Past Surgical History  Procedure Laterality Date  . Knee surgery      right knee   No family history on file. History  Substance Use Topics  . Smoking status: Never Smoker   . Smokeless tobacco: Not on file  . Alcohol Use: No    Review of Systems  Constitutional: Negative for fever and chills.  Eyes: Positive for visual disturbance (Foggy).  Gastrointestinal: Negative for nausea, vomiting and diarrhea.  Endocrine: Positive for polydipsia and polyuria.  All other systems reviewed and are negative.     Allergies  Review of patient's allergies indicates no known allergies.  Home Medications   Prior to Admission medications   Medication Sig Start Date End Date Taking? Authorizing Provider  glimepiride (AMARYL) 4 MG tablet Take 4 mg by mouth 2 (two) times daily.   Yes Historical Provider, MD   lisinopril (PRINIVIL,ZESTRIL) 20 MG tablet Take 20 mg by mouth daily.   Yes Historical Provider, MD  lovastatin (MEVACOR) 40 MG tablet Take 40 mg by mouth at bedtime.   Yes Historical Provider, MD  metFORMIN (GLUCOPHAGE) 850 MG tablet Take 1 tablet (850 mg total) by mouth 3 (three) times daily. 07/03/13   Oluwatobiloba Martin L Muskan Bolla, PA-C   BP 120/79  Pulse 71  Temp(Src) 97.9 F (36.6 C) (Oral)  Resp 14  Ht 6\' 1"  (1.854 m)  Wt 251 lb (113.853 kg)  BMI 33.12 kg/m2  SpO2 98% Physical Exam  Nursing note and vitals reviewed. Constitutional: He is oriented to person, place, and time. He appears well-developed and well-nourished. No distress.  HENT:  Head: Normocephalic and atraumatic.  Right Ear: External ear normal.  Left Ear: External ear normal.  Nose: Nose normal.  Mouth/Throat: Oropharynx is clear and moist. No oropharyngeal exudate.  Eyes: Conjunctivae, EOM and lids are normal. Pupils are equal, round, and reactive to light.  Fundoscopic exam:      The right eye shows no papilledema.       The left eye shows no papilledema.  Neck: Normal range of motion. Neck supple.  Cardiovascular: Normal rate, regular rhythm and normal heart sounds.   Pulmonary/Chest: Effort normal and breath sounds normal. No respiratory distress.  Abdominal: Soft.  Musculoskeletal: Normal range of motion. He exhibits no edema.  Neurological: He is alert and oriented to person, place, and time.  Skin: Skin is warm and dry. He is not diaphoretic.  Psychiatric: His speech is normal. His mood appears  anxious.    ED Course  Procedures (including critical care time) Medications  sodium chloride 0.9 % bolus 1,000 mL (0 mLs Intravenous Stopped 07/02/13 2317)  insulin aspart (novoLOG) injection 10 Units (10 Units Intravenous Given 07/02/13 2224)  LORazepam (ATIVAN) injection 1 mg (1 mg Intravenous Given 07/02/13 2233)  insulin aspart (novoLOG) injection 10 Units (10 Units Intravenous Given 07/02/13 2315)    Labs  Review Labs Reviewed  COMPREHENSIVE METABOLIC PANEL - Abnormal; Notable for the following:    Sodium 129 (*)    Chloride 94 (*)    Glucose, Bld 440 (*)    Total Bilirubin 0.2 (*)    All other components within normal limits  URINALYSIS, ROUTINE W REFLEX MICROSCOPIC - Abnormal; Notable for the following:    Specific Gravity, Urine 1.036 (*)    Glucose, UA >1000 (*)    All other components within normal limits  CBG MONITORING, ED - Abnormal; Notable for the following:    Glucose-Capillary 468 (*)    All other components within normal limits  CBG MONITORING, ED - Abnormal; Notable for the following:    Glucose-Capillary 308 (*)    All other components within normal limits  CBG MONITORING, ED - Abnormal; Notable for the following:    Glucose-Capillary 182 (*)    All other components within normal limits  CBC  URINE MICROSCOPIC-ADD ON    Imaging Review No results found.   EKG Interpretation None      MDM   Final diagnoses:  Hyperglycemia without ketosis    Filed Vitals:   07/03/13 0027  BP: 120/79  Pulse: 71  Temp:   Resp: 14   Anion Gap is 14.   Patient is very anxious during IV placement, requesting something to help with his nerves. And given. Symptoms improved.    Afebrile, NAD, non-toxic appearing, AAOx4.    Patient presented with hyperglycemia. This is likely due to noncompliance with medication x3 months. There is no nausea, vomiting, diarrhea. Patient's anion gap is 14. There no ketones in the urine. No evidence of DKA. The patient was treated with IV fluids and IV NovoLog. His glucose was decreased from 468-198. He is able to tolerate by mouth intake without difficulty. Patient provided a limited refill on his metformin until he is seen by his PCP on Thursday. Return precautions discussed. Patient is agreeable to plan. Patient is stable at time of discharge.   Harlow Mares, PA-C 07/03/13 0050

## 2013-07-02 NOTE — ED Notes (Signed)
Patient reports high blood sugar at home (>500). Patient states he had been taking oral glucose control medication but ran out about 3 months ago. Patient reports blurred vision today. Patient states he has an appointment coming up with his dr.

## 2013-07-02 NOTE — ED Notes (Signed)
Charge nurse requested to bedside for IV start.

## 2013-07-02 NOTE — ED Notes (Signed)
IV attempt x2, unsuccessful

## 2013-07-02 NOTE — ED Notes (Signed)
Per pt report: pt reports being out of his metformin for about 3 months.  His PCP won't fill his prescription without a visit.  Pt reports having an appt with his PCP this thursdays.  Pt took his blood sugar at home and it read 558. Pt a/o x 4.  Skin warm and dry. Pt reports having "cloudy" vision.  NAD noted.

## 2013-07-03 LAB — CBG MONITORING, ED: GLUCOSE-CAPILLARY: 182 mg/dL — AB (ref 70–99)

## 2013-07-03 MED ORDER — METFORMIN HCL 850 MG PO TABS: 850.0000 mg | ORAL_TABLET | Freq: Three times a day (TID) | ORAL | Status: AC

## 2013-07-03 NOTE — Discharge Instructions (Signed)
Please follow up with your primary care physician in 1-2 days. If you do not have one please call the [facility] number listed above. Please take your Metformin as prescribed. Please read all discharge instructions and return precautions.    Hyperglycemia Hyperglycemia occurs when the glucose (sugar) in your blood is too high. Hyperglycemia can happen for many reasons, but it most often happens to people who do not know they have diabetes or are not managing their diabetes properly.  CAUSES  Whether you have diabetes or not, there are other causes of hyperglycemia. Hyperglycemia can occur when you have diabetes, but it can also occur in other situations that you might not be as aware of, such as: Diabetes  If you have diabetes and are having problems controlling your blood glucose, hyperglycemia could occur because of some of the following reasons:  Not following your meal plan.  Not taking your diabetes medications or not taking it properly.  Exercising less or doing less activity than you normally do.  Being sick. Pre-diabetes  This cannot be ignored. Before people develop Type 2 diabetes, they almost always have "pre-diabetes." This is when your blood glucose levels are higher than normal, but not yet high enough to be diagnosed as diabetes. Research has shown that some long-term damage to the body, especially the heart and circulatory system, may already be occurring during pre-diabetes. If you take action to manage your blood glucose when you have pre-diabetes, you may delay or prevent Type 2 diabetes from developing. Stress  If you have diabetes, you may be "diet" controlled or on oral medications or insulin to control your diabetes. However, you may find that your blood glucose is higher than usual in the hospital whether you have diabetes or not. This is often referred to as "stress hyperglycemia." Stress can elevate your blood glucose. This happens because of  hormones put out by the body during times of stress. If stress has been the cause of your high blood glucose, it can be followed regularly by your caregiver. That way he/she can make sure your hyperglycemia does not continue to get worse or progress to diabetes. Steroids  Steroids are medications that act on the infection fighting system (immune system) to block inflammation or infection. One side effect can be a rise in blood glucose. Most people can produce enough extra insulin to allow for this rise, but for those who cannot, steroids make blood glucose levels go even higher. It is not unusual for steroid treatments to "uncover" diabetes that is developing. It is not always possible to determine if the hyperglycemia will go away after the steroids are stopped. A special blood test called an A1c is sometimes done to determine if your blood glucose was elevated before the steroids were started. SYMPTOMS  Thirsty.  Frequent urination.  Dry mouth.  Blurred vision.  Tired or fatigue.  Weakness.  Sleepy.  Tingling in feet or leg. DIAGNOSIS  Diagnosis is made by monitoring blood glucose in one or all of the following ways:  A1c test. This is a chemical found in your blood.  Fingerstick blood glucose monitoring.  Laboratory results. TREATMENT  First, knowing the cause of the hyperglycemia is important before the hyperglycemia can be treated. Treatment may include, but is not be limited to:  Education.  Change or adjustment in medications.  Change or adjustment in meal plan.  Treatment for an illness, infection, etc.  More frequent blood glucose monitoring.  Change in exercise plan.  Decreasing or stopping steroids.  Lifestyle changes. HOME CARE INSTRUCTIONS   Test your blood glucose as directed.  Exercise regularly. Your caregiver will give you instructions about exercise. Pre-diabetes or diabetes which comes on with stress is helped by exercising.  Eat wholesome,  balanced meals. Eat often and at regular, fixed times. Your caregiver or nutritionist will give you a meal plan to guide your sugar intake.  Being at an ideal weight is important. If needed, losing as little as 10 to 15 pounds may help improve blood glucose levels. SEEK MEDICAL CARE IF:   You have questions about medicine, activity, or diet.  You continue to have symptoms (problems such as increased thirst, urination, or weight gain). SEEK IMMEDIATE MEDICAL CARE IF:   You are vomiting or have diarrhea.  Your breath smells fruity.  You are breathing faster or slower.  You are very sleepy or incoherent.  You have numbness, tingling, or pain in your feet or hands.  You have chest pain.  Your symptoms get worse even though you have been following your caregiver's orders.  If you have any other questions or concerns. Document Released: 07/14/2000 Document Revised: 04/12/2011 Document Reviewed: 05/17/2011 [facility] Patient Information 2014 [place], [place].

## 2013-07-05 NOTE — ED Provider Notes (Signed)
Medical screening examination/treatment/procedure(s) were performed by non-physician practitioner and as supervising physician I was immediately available for consultation/collaboration.   EKG Interpretation None       Virgel Manifold, MD 07/05/13 [phone]

## 2015-02-25 ENCOUNTER — Emergency Department (HOSPITAL_COMMUNITY)
Admission: EM | Admit: 2015-02-25 | Discharge: 2015-02-25 | Disposition: A | Discharge status: Home / Self Care | Payer: Managed Care, Other (non HMO) | Attending: Emergency Medicine | Admitting: Emergency Medicine

## 2015-02-25 ENCOUNTER — Encounter (HOSPITAL_COMMUNITY): Payer: Self-pay | Admitting: Emergency Medicine

## 2015-02-25 ENCOUNTER — Emergency Department (HOSPITAL_COMMUNITY): Payer: Managed Care, Other (non HMO)

## 2015-02-25 DIAGNOSIS — R69 Illness, unspecified: Secondary | ICD-10-CM

## 2015-02-25 DIAGNOSIS — R112 Nausea with vomiting, unspecified: Secondary | ICD-10-CM | POA: Diagnosis present

## 2015-02-25 DIAGNOSIS — J111 Influenza due to unidentified influenza virus with other respiratory manifestations: Secondary | ICD-10-CM | POA: Diagnosis not present

## 2015-02-25 DIAGNOSIS — E119 Type 2 diabetes mellitus without complications: Secondary | ICD-10-CM | POA: Diagnosis not present

## 2015-02-25 DIAGNOSIS — Z7984 Long term (current) use of oral hypoglycemic drugs: Secondary | ICD-10-CM | POA: Insufficient documentation

## 2015-02-25 DIAGNOSIS — Z79899 Other long term (current) drug therapy: Secondary | ICD-10-CM | POA: Insufficient documentation

## 2015-02-25 DIAGNOSIS — I10 Essential (primary) hypertension: Secondary | ICD-10-CM | POA: Insufficient documentation

## 2015-02-25 MED ORDER — BENZONATATE 100 MG PO CAPS: 100.0000 mg | ORAL_CAPSULE | Freq: Three times a day (TID) | ORAL | Status: DC

## 2015-02-25 MED ORDER — IBUPROFEN 200 MG PO TABS
400.0000 mg | ORAL_TABLET | Freq: Once | ORAL | Status: AC
  Administered 2015-02-25: 400 mg via ORAL
  Filled 2015-02-25: qty 2

## 2015-02-25 MED ORDER — ONDANSETRON 4 MG PO TBDP: ORAL_TABLET | ORAL | Status: DC

## 2015-02-25 NOTE — ED Provider Notes (Signed)
CSN: [id number]     Arrival date & time 02/25/15  1714 History   First MD Initiated Contact with Patient 02/25/15 2031     Chief Complaint  Patient presents with  . Fever  . Cough  . Nausea  . Emesis     (Consider location/radiation/quality/duration/timing/severity/associated sxs/prior Treatment) Patient is a 50 y.o. male presenting with fever, cough, vomiting, and general illness. The history is provided by the patient.  Fever Associated symptoms: congestion and cough   Associated symptoms: no chest pain, no chills, no confusion, no diarrhea, no headaches, no myalgias, no rash and no vomiting   Cough Associated symptoms: no chest pain, no chills, no eye discharge, no fever, no headaches, no myalgias, no rash and no shortness of breath   Emesis Associated symptoms: no abdominal pain, no arthralgias, no chills, no diarrhea, no headaches and no myalgias   Illness Severity:  Moderate Onset quality:  Sudden Timing:  Constant Progression:  Worsening Chronicity:  New Associated symptoms: congestion and cough   Associated symptoms: no abdominal pain, no chest pain, no diarrhea, no fever, no headaches, no myalgias, no rash, no shortness of breath and no vomiting    50 yo M with URI like symptoms.  Cough, congestion, fevers, chills.  Denies sick contacts.   Past Medical History  Diagnosis Date  . Diabetes mellitus   . Hypertension    Past Surgical History  Procedure Laterality Date  . Knee surgery      right knee   No family history on file. Social History  Substance Use Topics  . Smoking status: Never Smoker   . Smokeless tobacco: None  . Alcohol Use: No    Review of Systems  Constitutional: Negative for fever and chills.  HENT: Positive for congestion. Negative for facial swelling.   Eyes: Negative for discharge and visual disturbance.  Respiratory: Positive for cough. Negative for shortness of breath.   Cardiovascular: Negative for chest pain and palpitations.   Gastrointestinal: Negative for vomiting, abdominal pain and diarrhea.  Musculoskeletal: Negative for myalgias and arthralgias.  Skin: Negative for color change and rash.  Neurological: Negative for tremors, syncope and headaches.  Psychiatric/Behavioral: Negative for confusion and dysphoric mood.      Allergies  Review of patient's allergies indicates no known allergies.  Home Medications   Prior to Admission medications   Medication Sig Start Date End Date Taking? Authorizing Provider  glimepiride (AMARYL) 4 MG tablet Take 4 mg by mouth 2 (two) times daily.   Yes Historical Provider, MD  lisinopril (PRINIVIL,ZESTRIL) 40 MG tablet Take 40 mg by mouth daily.  01/17/15  Yes Historical Provider, MD  lovastatin (MEVACOR) 40 MG tablet Take 40 mg by mouth at bedtime.   Yes Historical Provider, MD  metFORMIN (GLUCOPHAGE) 850 MG tablet Take 1 tablet (850 mg total) by mouth 3 (three) times daily. 07/03/13  Yes Jennifer Piepenbrink, PA-C  benzonatate (TESSALON) 100 MG capsule Take 1 capsule (100 mg total) by mouth every 8 (eight) hours. 02/25/15   Deno Etienne, DO  ondansetron (ZOFRAN ODT) 4 MG disintegrating tablet 4mg  ODT q4 hours prn nausea/vomit 02/25/15   Deno Etienne, DO   BP 122/76 mmHg  Pulse 78  Temp(Src) 99.2 F (37.3 C) (Oral)  Resp 16  SpO2 97% Physical Exam  Constitutional: He is oriented to person, place, and time. He appears well-developed and well-nourished.  HENT:  Head: Normocephalic and atraumatic.  Rhinorrhea, congestion.   Eyes: EOM are normal. Pupils are equal, round, and reactive to  light.  Neck: Normal range of motion. Neck supple. No JVD present.  Cardiovascular: Normal rate and regular rhythm.  Exam reveals no gallop and no friction rub.   No murmur heard. Pulmonary/Chest: No respiratory distress. He has no wheezes.  Abdominal: He exhibits no distension. There is no tenderness. There is no rebound and no guarding.  Musculoskeletal: Normal range of motion.   Neurological: He is alert and oriented to person, place, and time.  Skin: No rash noted. No pallor.  Psychiatric: He has a normal mood and affect. His behavior is normal.  Nursing note and vitals reviewed.   ED Course  Procedures (including critical care time) Labs Review Labs Reviewed - No data to display  Imaging Review Dg Chest 2 View  02/25/2015  CLINICAL DATA:  Cough, fever, body aches, emesis, and nausea for 3 days. EXAM: CHEST  2 VIEW COMPARISON:  03/04/2014 FINDINGS: The heart size and mediastinal contours are within normal limits. Both lungs are clear. The visualized skeletal structures are unremarkable. IMPRESSION: No active cardiopulmonary disease. Electronically Signed   By: Lucienne Capers M.D.   On: 02/25/2015 21:11   I have personally reviewed and evaluated these images and lab results as part of my medical decision-making.   EKG Interpretation None      MDM   Final diagnoses:  Influenza-like illness    50 yo M with URI like symptoms.  Well appearing, non toxic.  CXR unremarkable.  D/c home.   10:16 PM:  I have discussed the diagnosis/risks/treatment options with the patient and family and believe the pt to be eligible for discharge home to follow-up with PCP. We also discussed returning to the ED immediately if new or worsening sx occur. We discussed the sx which are most concerning (e.g., sudden worsening pain, fever, inability to tolerate by mouth) that necessitate immediate return. Medications administered to the patient during their visit and any new prescriptions provided to the patient are listed below.  Medications given during this visit Medications  ibuprofen (ADVIL,MOTRIN) tablet 400 mg (400 mg Oral Given 02/25/15 1836)    Discharge Medication List as of 02/25/2015  9:18 PM    START taking these medications   Details  benzonatate (TESSALON) 100 MG capsule Take 1 capsule (100 mg total) by mouth every 8 (eight) hours., Starting 02/25/2015, Until  Discontinued, Print    ondansetron (ZOFRAN ODT) 4 MG disintegrating tablet 4mg  ODT q4 hours prn nausea/vomit, Print        The patient appears reasonably screen and/or stabilized for discharge and I doubt any other medical condition or other [facility] requiring further screening, evaluation, or treatment in the ED at this time prior to discharge.      Deno Etienne, DO 02/25/15 2216

## 2015-02-25 NOTE — Discharge Instructions (Signed)
Take tylenol 2 pills 4 times a day and motrin 4 pills 3 times a day.  Drink plenty of fluids.  Return for worsening shortness of breath, headache, confusion. Follow up with your family doctor.   Influenza, Adult Influenza ("the flu") is a viral infection of the respiratory tract. It occurs more often in winter months because people spend more time in close contact with one another. Influenza can make you feel very sick. Influenza easily spreads from person to person (contagious). CAUSES  Influenza is caused by a virus that infects the respiratory tract. You can catch the virus by breathing in droplets from an infected person's cough or sneeze. You can also catch the virus by touching something that was recently contaminated with the virus and then touching your mouth, nose, or eyes. RISKS AND COMPLICATIONS You may be at risk for a more severe case of influenza if you smoke cigarettes, have diabetes, have chronic heart disease (such as heart failure) or lung disease (such as asthma), or if you have a weakened immune system. Elderly people and pregnant women are also at risk for more serious infections. The most common problem of influenza is a lung infection (pneumonia). Sometimes, this problem can require emergency medical care and may be life threatening. SIGNS AND SYMPTOMS  Symptoms typically last 4 to 10 days and may include:  Fever.  Chills.  Headache, body aches, and muscle aches.  Sore throat.  Chest discomfort and cough.  Poor appetite.  Weakness or feeling tired.  Dizziness.  Nausea or vomiting. DIAGNOSIS  Diagnosis of influenza is often made based on your history and a physical exam. A nose or throat swab test can be done to confirm the diagnosis. TREATMENT  In mild cases, influenza goes away on its own. Treatment is directed at relieving symptoms. For more severe cases, your health care provider may prescribe antiviral medicines to shorten the sickness. Antibiotic medicines  are not effective because the infection is caused by a virus, not by bacteria. HOME CARE INSTRUCTIONS  Take medicines only as directed by your health care provider.  Use a cool mist humidifier to make breathing easier.  Get plenty of rest until your temperature returns to normal. This usually takes 3 to 4 days.  Drink enough fluid to keep your urine clear or pale yellow.  Cover yourmouth and nosewhen coughing or sneezing,and wash your handswellto prevent thevirusfrom spreading.  Stay homefromwork orschool untilthe fever is gonefor at least 33full day. PREVENTION  An annual influenza vaccination (flu shot) is the best way to avoid getting influenza. An annual flu shot is now routinely recommended for all adults in the [place] IF:  You experiencechest pain, yourcough worsens,or you producemore mucus.  Youhave nausea,vomiting, ordiarrhea.  Your fever returns or gets worse. SEEK IMMEDIATE MEDICAL CARE IF:  You havetrouble breathing, you become short of breath,or your skin ornails becomebluish.  You have severe painor stiffnessin the neck.  You develop a sudden headache, or pain in the face or ear.  You have nausea or vomiting that you cannot control. MAKE SURE YOU:   Understand these instructions.  Will watch your condition.  Will get help right away if you are not doing well or get worse.   This information is not intended to replace advice given to you by your health care provider. Make sure you discuss any questions you have with your health care provider.   Document Released: 01/16/2000 Document Revised: 02/08/2014 Document Reviewed: 04/19/2011 Elsevier Interactive Patient Education  Education ©2016 [employer]vier Inc. ° °

## 2015-02-25 NOTE — ED Notes (Signed)
Pt states that he has been having cough, fever, sore throat, body aches, and vomiting since yesterday.

## 2015-06-25 ENCOUNTER — Inpatient Hospital Stay (HOSPITAL_COMMUNITY)
Admission: EM | Admit: 2015-06-25 | Discharge: 2015-06-28 | DRG: 287 | Disposition: A | Discharge status: Home / Self Care | Payer: Managed Care, Other (non HMO) | Attending: Family Medicine | Admitting: Family Medicine

## 2015-06-25 ENCOUNTER — Encounter (HOSPITAL_COMMUNITY): Payer: Self-pay

## 2015-06-25 ENCOUNTER — Emergency Department (HOSPITAL_COMMUNITY): Payer: Managed Care, Other (non HMO)

## 2015-06-25 DIAGNOSIS — I2510 Atherosclerotic heart disease of native coronary artery without angina pectoris: Secondary | ICD-10-CM | POA: Diagnosis not present

## 2015-06-25 DIAGNOSIS — M25519 Pain in unspecified shoulder: Secondary | ICD-10-CM | POA: Diagnosis present

## 2015-06-25 DIAGNOSIS — E7800 Pure hypercholesterolemia, unspecified: Secondary | ICD-10-CM

## 2015-06-25 DIAGNOSIS — I10 Essential (primary) hypertension: Secondary | ICD-10-CM | POA: Diagnosis not present

## 2015-06-25 DIAGNOSIS — Z79899 Other long term (current) drug therapy: Secondary | ICD-10-CM

## 2015-06-25 DIAGNOSIS — Z955 Presence of coronary angioplasty implant and graft: Secondary | ICD-10-CM

## 2015-06-25 DIAGNOSIS — R079 Chest pain, unspecified: Secondary | ICD-10-CM

## 2015-06-25 DIAGNOSIS — E119 Type 2 diabetes mellitus without complications: Secondary | ICD-10-CM | POA: Insufficient documentation

## 2015-06-25 DIAGNOSIS — Z7984 Long term (current) use of oral hypoglycemic drugs: Secondary | ICD-10-CM

## 2015-06-25 DIAGNOSIS — Z8249 Family history of ischemic heart disease and other diseases of the circulatory system: Secondary | ICD-10-CM

## 2015-06-25 DIAGNOSIS — F419 Anxiety disorder, unspecified: Secondary | ICD-10-CM | POA: Diagnosis present

## 2015-06-25 DIAGNOSIS — F329 Major depressive disorder, single episode, unspecified: Secondary | ICD-10-CM | POA: Diagnosis present

## 2015-06-25 HISTORY — DX: Depression, unspecified: F32.A

## 2015-06-25 HISTORY — DX: Major depressive disorder, single episode, unspecified: F32.9

## 2015-06-25 HISTORY — DX: Pure hypercholesterolemia, unspecified: E78.00

## 2015-06-25 LAB — BASIC METABOLIC PANEL
Anion gap: 8 (ref 5–15)
BUN: 19 mg/dL (ref 6–20)
CO2: 22 mmol/L (ref 22–32)
Calcium: 9.6 mg/dL (ref 8.9–10.3)
Chloride: 105 mmol/L (ref 101–111)
Creatinine, Ser: 1.06 mg/dL (ref 0.61–1.24)
GFR calc Af Amer: >60 mL/min (ref >60)
GLUCOSE: 138 mg/dL — AB (ref 65–99)
POTASSIUM: 4.1 mmol/L (ref 3.5–5.1)
Sodium: 135 mmol/L (ref 135–145)

## 2015-06-25 LAB — TROPONIN I
Troponin I: <0.03 ng/mL (ref <0.031)
Troponin I: <0.03 ng/mL (ref <0.031)

## 2015-06-25 LAB — CBC
HEMATOCRIT: 47.2 % (ref 39.0–52.0)
Hemoglobin: 16.8 g/dL (ref 13.0–17.0)
MCH: 30.1 pg (ref 26.0–34.0)
MCHC: 35.6 g/dL (ref 30.0–36.0)
MCV: 84.6 fL (ref 78.0–100.0)
Platelets: 197 10*3/uL (ref 150–400)
RBC: 5.58 MIL/uL (ref 4.22–5.81)
RDW: 13 % (ref 11.5–15.5)
WBC: 8.3 10*3/uL (ref 4.0–10.5)

## 2015-06-25 LAB — GLUCOSE, CAPILLARY: GLUCOSE-CAPILLARY: 287 mg/dL — AB (ref 65–99)

## 2015-06-25 MED ORDER — NITROGLYCERIN IN D5W 200-5 MCG/ML-% IV SOLN: 10.0000 ug/min | INTRAVENOUS | Status: DC

## 2015-06-25 MED ORDER — ONDANSETRON HCL 4 MG/2ML IJ SOLN: 4.0000 mg | Freq: Four times a day (QID) | INTRAMUSCULAR | Status: DC | PRN

## 2015-06-25 MED ORDER — ENOXAPARIN SODIUM 40 MG/0.4ML ~~LOC~~ SOLN
40.0000 mg | Freq: Every day | SUBCUTANEOUS | Status: DC
  Administered 2015-06-25 – 2015-06-27 (×3): 40 mg via SUBCUTANEOUS
  Filled 2015-06-25 (×3): qty 0.4

## 2015-06-25 MED ORDER — OXYCODONE HCL 5 MG PO TABS
5.0000 mg | ORAL_TABLET | ORAL | Status: DC | PRN
  Administered 2015-06-26: 5 mg via ORAL
  Filled 2015-06-25: qty 1

## 2015-06-25 MED ORDER — ASPIRIN 81 MG PO CHEW
324.0000 mg | CHEWABLE_TABLET | Freq: Once | ORAL | Status: AC
  Administered 2015-06-25: 324 mg via ORAL
  Filled 2015-06-25: qty 4

## 2015-06-25 MED ORDER — ACETAMINOPHEN 325 MG PO TABS
650.0000 mg | ORAL_TABLET | Freq: Four times a day (QID) | ORAL | Status: DC | PRN
  Administered 2015-06-26: 650 mg via ORAL
  Filled 2015-06-25: qty 2

## 2015-06-25 MED ORDER — ONDANSETRON HCL 4 MG PO TABS: 4.0000 mg | ORAL_TABLET | Freq: Four times a day (QID) | ORAL | Status: DC | PRN

## 2015-06-25 MED ORDER — NITROGLYCERIN 2 % TD OINT: 1.0000 [in_us] | TOPICAL_OINTMENT | Freq: Four times a day (QID) | TRANSDERMAL | Status: DC

## 2015-06-25 MED ORDER — INSULIN ASPART 100 UNIT/ML ~~LOC~~ SOLN
0.0000 [IU] | Freq: Every day | SUBCUTANEOUS | Status: DC
  Administered 2015-06-25 – 2015-06-27 (×3): 3 [IU] via SUBCUTANEOUS

## 2015-06-25 MED ORDER — HYDROMORPHONE HCL 1 MG/ML IJ SOLN: 0.5000 mg | INTRAMUSCULAR | Status: DC | PRN

## 2015-06-25 MED ORDER — SODIUM CHLORIDE 0.9% FLUSH
3.0000 mL | Freq: Two times a day (BID) | INTRAVENOUS | Status: DC
  Administered 2015-06-25 – 2015-06-27 (×4): 3 mL via INTRAVENOUS

## 2015-06-25 MED ORDER — NITROGLYCERIN 0.4 MG SL SUBL
0.4000 mg | SUBLINGUAL_TABLET | SUBLINGUAL | Status: AC | PRN
  Administered 2015-06-25 – 2015-06-26 (×3): 0.4 mg via SUBLINGUAL
  Filled 2015-06-25 (×2): qty 1

## 2015-06-25 MED ORDER — SODIUM CHLORIDE 0.9% FLUSH: 3.0000 mL | INTRAVENOUS | Status: DC | PRN

## 2015-06-25 MED ORDER — CITALOPRAM HYDROBROMIDE 20 MG PO TABS
20.0000 mg | ORAL_TABLET | Freq: Every day | ORAL | Status: DC
  Administered 2015-06-26 – 2015-06-28 (×3): 20 mg via ORAL
  Filled 2015-06-25 (×3): qty 1

## 2015-06-25 MED ORDER — ASPIRIN EC 325 MG PO TBEC
325.0000 mg | DELAYED_RELEASE_TABLET | Freq: Every day | ORAL | Status: DC
  Administered 2015-06-26 – 2015-06-28 (×2): 325 mg via ORAL
  Filled 2015-06-25 (×2): qty 1

## 2015-06-25 MED ORDER — INSULIN ASPART 100 UNIT/ML ~~LOC~~ SOLN
0.0000 [IU] | Freq: Three times a day (TID) | SUBCUTANEOUS | Status: DC
  Administered 2015-06-26: 3 [IU] via SUBCUTANEOUS
  Administered 2015-06-27: 1 [IU] via SUBCUTANEOUS
  Administered 2015-06-27 – 2015-06-28 (×2): 2 [IU] via SUBCUTANEOUS

## 2015-06-25 MED ORDER — LISINOPRIL 40 MG PO TABS
40.0000 mg | ORAL_TABLET | Freq: Every day | ORAL | Status: DC
  Administered 2015-06-26 – 2015-06-28 (×2): 40 mg via ORAL
  Filled 2015-06-25 (×2): qty 2
  Filled 2015-06-25: qty 1

## 2015-06-25 MED ORDER — ONDANSETRON HCL 4 MG/2ML IJ SOLN
4.0000 mg | Freq: Once | INTRAMUSCULAR | Status: AC
  Administered 2015-06-25: 4 mg via INTRAVENOUS
  Filled 2015-06-25: qty 2

## 2015-06-25 MED ORDER — ACETAMINOPHEN 650 MG RE SUPP: 650.0000 mg | Freq: Four times a day (QID) | RECTAL | Status: DC | PRN

## 2015-06-25 MED ORDER — PRAVASTATIN SODIUM 40 MG PO TABS
40.0000 mg | ORAL_TABLET | Freq: Every day | ORAL | Status: DC
Start: 2015-06-26 — End: 2015-06-26

## 2015-06-25 MED ORDER — SODIUM CHLORIDE 0.9 % IV SOLN
250.0000 mL | INTRAVENOUS | Status: DC | PRN
  Administered 2015-06-26: 250 mL via INTRAVENOUS

## 2015-06-25 MED ORDER — MORPHINE SULFATE (PF) 4 MG/ML IV SOLN
4.0000 mg | Freq: Once | INTRAVENOUS | Status: AC
  Administered 2015-06-25: 4 mg via INTRAVENOUS
  Filled 2015-06-25: qty 1

## 2015-06-25 NOTE — ED Notes (Signed)
Patient states he was getting dressed for work yesterday and began having left chest pain that has been intermittent. Patient also c/o diaphoresis, weakness, slight SOB, and chest pain radiates into the back.

## 2015-06-25 NOTE — ED Notes (Signed)
Patient transported to X-ray 

## 2015-06-25 NOTE — ED Notes (Signed)
Hospitalist at bedside 

## 2015-06-25 NOTE — ED Provider Notes (Signed)
CSN: [id number]     Arrival date & time 06/25/15  1722 History   First MD Initiated Contact with Patient 06/25/15 1811     Chief Complaint  Patient presents with  . Chest Pain  . Numbness     (Consider location/radiation/quality/duration/timing/severity/associated sxs/prior Treatment) HPI   Blood pressure 137/96, pulse 75, temperature 98.1 F (36.7 C), temperature source Oral, resp. rate 16, height 6\' 2"  (1.88 m), weight 101.606 kg, SpO2 98 %.  [name] is a 50 y.o. male non-insulin-dependent diabetic, hypertension, hyperlipidemia complaining of left-sided chest pain radiating to the back onset yesterday, intermittent, not tied to exertion, position, cough.  He states the pain is sharp, rated at 10 out of 10, he's had about 5 episodes today, last for 5-10 minutes. He has a history of prior catheterization 2008 at [facility], states there were no stents place. States that he saw a cardiologist in [place] once he started him on nitroglycerin, he hasn't taken any nitroglycerin today. He denies Syncope. States he had an episode of lightheadedness yesterday, endorses productive cough on review of systems. She also reports at the bottom of his feet feel cold, on further questioning he states that it's more of a pins and needles paresthesia with no weakness or abdominal pain.   Past Medical History  Diagnosis Date  . Diabetes mellitus   . Hypertension   . High cholesterol   . Depression    Past Surgical History  Procedure Laterality Date  . Knee surgery      right knee   Family History  Problem Relation Age of Onset  . Hypertension Mother   . Heart failure Father    Social History  Substance Use Topics  . Smoking status: Never Smoker   . Smokeless tobacco: None  . Alcohol Use: No    Review of Systems  10 systems reviewed and found to be negative, except as noted in the HPI.   Allergies  Review of patient's allergies indicates no known allergies.  Home  Medications   Prior to Admission medications   Medication Sig Start Date End Date Taking? Authorizing Provider  citalopram (CELEXA) 20 MG tablet Take 20 mg by mouth daily. 06/21/15  Yes Historical Provider, MD  FARXIGA 5 MG TABS tablet Take 5 mg by mouth daily. 06/19/15  Yes Historical Provider, MD  glimepiride (AMARYL) 4 MG tablet Take 4 mg by mouth 2 (two) times daily.   Yes Historical Provider, MD  lisinopril (PRINIVIL,ZESTRIL) 40 MG tablet Take 40 mg by mouth daily.  01/17/15  Yes Historical Provider, MD  lovastatin (MEVACOR) 40 MG tablet Take 40 mg by mouth 2 (two) times daily.    Yes Historical Provider, MD  metFORMIN (GLUCOPHAGE) 850 MG tablet Take 1 tablet (850 mg total) by mouth 3 (three) times daily. 07/03/13  Yes Jennifer Piepenbrink, PA-C  benzonatate (TESSALON) 100 MG capsule Take 1 capsule (100 mg total) by mouth every 8 (eight) hours. Patient not taking: Reported on 06/25/2015 02/25/15   Deno Etienne, DO  ondansetron (ZOFRAN ODT) 4 MG disintegrating tablet 4mg  ODT q4 hours prn nausea/vomit Patient not taking: Reported on 06/25/2015 02/25/15   Deno Etienne, DO   BP 126/79 mmHg  Pulse 75  Temp(Src) 98.1 F (36.7 C) (Oral)  Resp 18  Ht 6\' 2"  (1.88 m)  Wt 101.606 kg  BMI 28.75 kg/m2  SpO2 94% Physical Exam  Constitutional: He is oriented to person, place, and time. He appears well-developed and well-nourished. No distress.  HENT:  Head: Normocephalic.  Mouth/Throat: Oropharynx is clear and moist.  Eyes: Conjunctivae are normal.  Neck: Normal range of motion. No JVD present. No tracheal deviation present.  Cardiovascular: Normal rate, regular rhythm and intact distal pulses.   Radial pulse equal bilaterally  Pulmonary/Chest: Effort normal and breath sounds normal. No stridor. No respiratory distress. He has no wheezes. He has no rales. He exhibits no tenderness.  Abdominal: Soft. He exhibits no distension and no mass. There is no tenderness. There is no rebound and no guarding.   Musculoskeletal: Normal range of motion. He exhibits no edema or tenderness.  No calf asymmetry, superficial collaterals, palpable cords, edema, Homans sign negative bilaterally.    Neurological: He is alert and oriented to person, place, and time.  Skin: Skin is warm. He is not diaphoretic.  Psychiatric: He has a normal mood and affect.  Nursing note and vitals reviewed.   ED Course  Procedures (including critical care time) Labs Review Labs Reviewed  BASIC METABOLIC PANEL - Abnormal; Notable for the following:    Glucose, Bld 138 (*)    All other components within normal limits  CBC  TROPONIN I  I-STAT TROPOININ, ED    Imaging Review Dg Chest 2 View  06/25/2015  CLINICAL DATA:  Left-sided chest pain, shortness of breath and productive cough. EXAM: CHEST  2 VIEW COMPARISON:  02/25/2015. FINDINGS: Trachea is midline. Heart size normal. Lungs are clear. No pleural fluid. IMPRESSION: No acute findings. Electronically Signed   By: Lorin Picket M.D.   On: 06/25/2015 18:18   I have personally reviewed and evaluated these images and lab results as part of my medical decision-making.   EKG Interpretation None      MDM   Final diagnoses:  Chest pain, unspecified chest pain type    Filed Vitals:   06/25/15 1729 06/25/15 1834 06/25/15 1914  BP: 137/96 140/88 126/79  Pulse: 75 61 75  Temp: 98.1 F (36.7 C) 98.1 F (36.7 C)   TempSrc: Oral Oral   Resp: 16 20 18   Height: 6\' 2"  (1.88 m)    Weight: 101.606 kg    SpO2: 98% 100% 94%    Medications  nitroGLYCERIN (NITROSTAT) SL tablet 0.4 mg (0.4 mg Sublingual Given 06/25/15 1910)  nitroGLYCERIN 50 mg in dextrose 5 % 250 mL (0.2 mg/mL) infusion (not administered)  aspirin chewable tablet 324 mg (324 mg Oral Given 06/25/15 1841)    [name] is 50 y.o. male presenting with Left sided chest pain radiating to back onset yesterday with associated nausea and diaphoresis, this is nonexertional. Patient with multiple cardiac risk  factors. EKG with no acute abnormality, chest x-ray blood work reassuring. Patient has had pain reduced from 10 out of 10-6 out of 10 after sublingual nitroglycerin, given his improvement will start nitroglycerin drip and patient will be admitted to Dr. Arnoldo Morale for chest pain rule out. Patient is moderate risk by heart score.    Monico Blitz, PA-C 06/25/15 2101  Daleen Bo, MD 06/26/15 [phone]

## 2015-06-25 NOTE — H&P (Signed)
[facility] Admission History and Physical       [name] [id number] DOB: [birth year] DOA: 06/25/2015  Referring physician: EDP PCP: Cher Nakai, MD  Specialists:   Chief Complaint: Left Sided Chest Pain  HPI: [name] is a 50 y.o. male with a history of HTN, DM2, and Hyperlipidemia who presents to the ED with complaints of intermittent left sided Chest Pain ( Pressure-Like) since last night rated at a 10/10 at the worst and associated with SOB, and Nausea and Diaphoresis.  The pain readiates into his back.    He was evaluated in the ED and found to have a negative initial cardiac workup and was referred for further evaluation.   Of Note he reports having a Cardiac Cath at [facility] in 2008.      Review of Systems:    Constitutional: No Weight Loss, No Weight Gain, Night Sweats, Fevers, Chills, Dizziness, Light Headedness, Fatigue, or Generalized Weakness HEENT: No Headaches, Difficulty Swallowing,Tooth/Dental Problems,Sore Throat,  No Sneezing, Rhinitis, Ear Ache, Nasal Congestion, or Post Nasal Drip,  Cardio-vascular:  +Chest pain, Orthopnea, PND, Edema in Lower Extremities, Anasarca, Dizziness, Palpitations  Resp: No Dyspnea, No DOE, No Productive Cough, No Non-Productive Cough, No Hemoptysis, No Wheezing.    GI: No Heartburn, Indigestion, Abdominal Pain, +Nausea, Vomiting, Diarrhea, Constipation, Hematemesis, Hematochezia, Melena, Change in Bowel Habits,  Loss of Appetite  GU: No Dysuria, No Change in Color of Urine, No Urgency or Urinary Frequency, No Flank pain.  Musculoskeletal: No Joint Pain or Swelling, No Decreased Range of Motion, No Back Pain.  Neurologic: No Syncope, No Seizures, Muscle Weakness, Paresthesia, Vision Disturbance or Loss, No Diplopia, No Vertigo, No Difficulty Walking,  Skin: No Rash or Lesions. Psych: No Change in Mood or Affect, No Depression or Anxiety, No Memory loss, No Confusion, or Hallucinations   Past Medical History    Diagnosis Date  . Diabetes mellitus   . Hypertension   . High cholesterol   . Depression      Past Surgical History  Procedure Laterality Date  . Knee surgery      right knee      Prior to Admission medications   Medication Sig Start Date End Date Taking? Authorizing Provider  citalopram (CELEXA) 20 MG tablet Take 20 mg by mouth daily. 06/21/15  Yes Historical Provider, MD  FARXIGA 5 MG TABS tablet Take 5 mg by mouth daily. 06/19/15  Yes Historical Provider, MD  glimepiride (AMARYL) 4 MG tablet Take 4 mg by mouth 2 (two) times daily.   Yes Historical Provider, MD  lisinopril (PRINIVIL,ZESTRIL) 40 MG tablet Take 40 mg by mouth daily.  01/17/15  Yes Historical Provider, MD  lovastatin (MEVACOR) 40 MG tablet Take 40 mg by mouth 2 (two) times daily.    Yes Historical Provider, MD  metFORMIN (GLUCOPHAGE) 850 MG tablet Take 1 tablet (850 mg total) by mouth 3 (three) times daily. 07/03/13  Yes Jennifer Piepenbrink, PA-C  benzonatate (TESSALON) 100 MG capsule Take 1 capsule (100 mg total) by mouth every 8 (eight) hours. Patient not taking: Reported on 06/25/2015 02/25/15   Deno Etienne, DO  ondansetron (ZOFRAN ODT) 4 MG disintegrating tablet 4mg  ODT q4 hours prn nausea/vomit Patient not taking: Reported on 06/25/2015 02/25/15   Deno Etienne, DO     No Known Allergies  Social History:  reports that he has never smoked. He does not have any smokeless tobacco history on file. He reports that he does not drink alcohol or use illicit drugs.  Family History  Problem Relation Age of Onset  . Hypertension Mother   . Heart failure Father        Physical Exam:  GEN:  Pleasant Obese 50 y.o. African American male examined and in no acute distress; cooperative with exam Filed Vitals:   06/25/15 1729 06/25/15 1834 06/25/15 1914  BP: 137/96 140/88 126/79  Pulse: 75 61 75  Temp: 98.1 F (36.7 C) 98.1 F (36.7 C)   TempSrc: Oral Oral   Resp: 16 20 18   Height: 6\' 2"  (1.88 m)    Weight: 101.606 kg  (224 lb)    SpO2: 98% 100% 94%   Blood pressure 126/79, pulse 75, temperature 98.1 F (36.7 C), temperature source Oral, resp. rate 18, height 6\' 2"  (1.88 m), weight 101.606 kg (224 lb), SpO2 94 %. PSYCH: He is alert and oriented x4; does not appear anxious does not appear depressed; affect is normal HEENT: Normocephalic and Atraumatic, Mucous membranes pink; PERRLA; EOM intact; Fundi:  Benign;  No scleral icterus, Nares: Patent, Oropharynx: Clear, Edentulous,    Neck:  FROM, No Cervical Lymphadenopathy nor Thyromegaly or Carotid Bruit; No JVD; Breasts:: Not examined CHEST WALL: No tenderness CHEST: Normal respiration, clear to auscultation bilaterally HEART: Regular rate and rhythm; no murmurs rubs or gallops BACK: No kyphosis or scoliosis; No CVA tenderness ABDOMEN: Positive Bowel Sounds, Obese, Soft Non-Tender, No Rebound or Guarding; No Masses, No Organomegaly. Rectal Exam: Not done EXTREMITIES: No Cyanosis, Clubbing, or Edema; No Ulcerations. Genitalia: not examined PULSES: 2+ and symmetric SKIN: Normal hydration no rash or ulceration CNS:  Alert and Oriented x 4, No Focal Deficits Vascular: pulses palpable throughout    Labs on Admission:  Basic Metabolic Panel:  Recent Labs Lab 06/25/15 1907  NA 135  K 4.1  CL 105  CO2 22  GLUCOSE 138*  BUN 19  CREATININE 1.06  CALCIUM 9.6   Liver Function Tests: No results for input(s): AST, ALT, ALKPHOS, BILITOT, PROT, ALBUMIN in the last 168 hours. No results for input(s): LIPASE, AMYLASE in the last 168 hours. No results for input(s): AMMONIA in the last 168 hours. CBC:  Recent Labs Lab 06/25/15 1907  WBC 8.3  HGB 16.8  HCT 47.2  MCV 84.6  PLT 197   Cardiac Enzymes:  Recent Labs Lab 06/25/15 1907  TROPONINI <0.03    BNP (last 3 results) No results for input(s): BNP in the last 8760 hours.  ProBNP (last 3 results) No results for input(s): PROBNP in the last 8760 hours.  CBG: No results for input(s): GLUCAP  in the last 168 hours.  Radiological Exams on Admission: Dg Chest 2 View  06/25/2015  CLINICAL DATA:  Left-sided chest pain, shortness of breath and productive cough. EXAM: CHEST  2 VIEW COMPARISON:  02/25/2015. FINDINGS: Trachea is midline. Heart size normal. Lungs are clear. No pleural fluid. IMPRESSION: No acute findings. Electronically Signed   By: Lorin Picket M.D.   On: 06/25/2015 18:18     EKG: Independently reviewed. Normal Sinus Rhythm Rate = 78 No Acute S-T changes    Assessment/Plan:   50 y.o. male with  Active Problems:    Chest pain   Telemetry Monitoring   Cycle Troponins   Nitropaste, O2 ASA Rx    Continue Lisinopril and Statin Rx      Diabetes mellitus   Hold Oral Hypoglycemics    SSI Coverage PRN   Check Hb A1C in AM      High cholesterol   Continue Statin  Rx, Take Mevacor at home      Hypertension   Continue Lisinopril RX    DVT Prophylaxis   Lovenox     Code Status:     FULL CODE       Family Communication:   Wife at Bedside      Disposition Plan:    Observation Status        Time spent:  48 Minutes      Theressa Millard [facility] Pager [phone]   If [place] Please Contact the Day Rounding Team MD for [facility]  If 7PM-7AM, Please Contact Night-Floor Coverage  [email or web address] Password [facility] 06/25/2015, 8:58 PM     ADDENDUM:   Patient was seen and examined on 06/25/2015

## 2015-06-25 NOTE — ED Notes (Signed)
Patient reports no relief after administration of first NTG dose.  Patient reports minimal relief of pain after second dose.  Patient states NTG makes him feel very anxious.  Third dose held.

## 2015-06-26 ENCOUNTER — Observation Stay (HOSPITAL_BASED_OUTPATIENT_CLINIC_OR_DEPARTMENT_OTHER): Payer: Managed Care, Other (non HMO)

## 2015-06-26 ENCOUNTER — Encounter (HOSPITAL_COMMUNITY): Payer: Self-pay | Admitting: Physician Assistant

## 2015-06-26 DIAGNOSIS — E7800 Pure hypercholesterolemia, unspecified: Secondary | ICD-10-CM

## 2015-06-26 DIAGNOSIS — R0789 Other chest pain: Secondary | ICD-10-CM | POA: Diagnosis not present

## 2015-06-26 DIAGNOSIS — I25110 Atherosclerotic heart disease of native coronary artery with unstable angina pectoris: Secondary | ICD-10-CM

## 2015-06-26 DIAGNOSIS — I200 Unstable angina: Secondary | ICD-10-CM

## 2015-06-26 DIAGNOSIS — Z8249 Family history of ischemic heart disease and other diseases of the circulatory system: Secondary | ICD-10-CM | POA: Diagnosis not present

## 2015-06-26 DIAGNOSIS — I2510 Atherosclerotic heart disease of native coronary artery without angina pectoris: Secondary | ICD-10-CM | POA: Diagnosis present

## 2015-06-26 DIAGNOSIS — F419 Anxiety disorder, unspecified: Secondary | ICD-10-CM | POA: Diagnosis present

## 2015-06-26 DIAGNOSIS — R072 Precordial pain: Secondary | ICD-10-CM | POA: Diagnosis not present

## 2015-06-26 DIAGNOSIS — Z7984 Long term (current) use of oral hypoglycemic drugs: Secondary | ICD-10-CM | POA: Diagnosis not present

## 2015-06-26 DIAGNOSIS — I10 Essential (primary) hypertension: Secondary | ICD-10-CM

## 2015-06-26 DIAGNOSIS — R079 Chest pain, unspecified: Secondary | ICD-10-CM

## 2015-06-26 DIAGNOSIS — M25519 Pain in unspecified shoulder: Secondary | ICD-10-CM | POA: Diagnosis present

## 2015-06-26 DIAGNOSIS — F329 Major depressive disorder, single episode, unspecified: Secondary | ICD-10-CM | POA: Diagnosis present

## 2015-06-26 DIAGNOSIS — Z79899 Other long term (current) drug therapy: Secondary | ICD-10-CM | POA: Diagnosis not present

## 2015-06-26 DIAGNOSIS — E118 Type 2 diabetes mellitus with unspecified complications: Secondary | ICD-10-CM | POA: Diagnosis not present

## 2015-06-26 DIAGNOSIS — Z955 Presence of coronary angioplasty implant and graft: Secondary | ICD-10-CM | POA: Diagnosis not present

## 2015-06-26 DIAGNOSIS — E119 Type 2 diabetes mellitus without complications: Secondary | ICD-10-CM | POA: Diagnosis present

## 2015-06-26 LAB — BASIC METABOLIC PANEL
Anion gap: 6 (ref 5–15)
BUN: 18 mg/dL (ref 6–20)
CALCIUM: 9.3 mg/dL (ref 8.9–10.3)
CO2: 23 mmol/L (ref 22–32)
Chloride: 108 mmol/L (ref 101–111)
Creatinine, Ser: 0.95 mg/dL (ref 0.61–1.24)
GFR calc Af Amer: >60 mL/min (ref >60)
GLUCOSE: 168 mg/dL — AB (ref 65–99)
POTASSIUM: 4.1 mmol/L (ref 3.5–5.1)
Sodium: 137 mmol/L (ref 135–145)

## 2015-06-26 LAB — TROPONIN I: Troponin I: <0.03 ng/mL (ref <0.031)

## 2015-06-26 LAB — GLUCOSE, CAPILLARY
GLUCOSE-CAPILLARY: 131 mg/dL — AB (ref 65–99)
GLUCOSE-CAPILLARY: 246 mg/dL — AB (ref 65–99)
GLUCOSE-CAPILLARY: 271 mg/dL — AB (ref 65–99)

## 2015-06-26 LAB — CBC
HEMATOCRIT: 45.9 % (ref 39.0–52.0)
Hemoglobin: 16.1 g/dL (ref 13.0–17.0)
MCH: 30 pg (ref 26.0–34.0)
MCHC: 35.1 g/dL (ref 30.0–36.0)
MCV: 85.6 fL (ref 78.0–100.0)
Platelets: 197 10*3/uL (ref 150–400)
RBC: 5.36 MIL/uL (ref 4.22–5.81)
RDW: 13.3 % (ref 11.5–15.5)
WBC: 7.4 10*3/uL (ref 4.0–10.5)

## 2015-06-26 LAB — ECHOCARDIOGRAM COMPLETE
Height: 74 in
Weight: 3808 oz

## 2015-06-26 MED ORDER — MORPHINE SULFATE (PF) 2 MG/ML IV SOLN
2.0000 mg | INTRAVENOUS | Status: DC | PRN
  Administered 2015-06-26: 2 mg via INTRAVENOUS
  Filled 2015-06-26: qty 1

## 2015-06-26 MED ORDER — NITROGLYCERIN IN D5W 200-5 MCG/ML-% IV SOLN
0.0000 ug/min | INTRAVENOUS | Status: DC
  Administered 2015-06-26: 10 ug/min via INTRAVENOUS
  Filled 2015-06-26: qty 250

## 2015-06-26 MED ORDER — LORAZEPAM 2 MG/ML IJ SOLN
0.5000 mg | Freq: Once | INTRAMUSCULAR | Status: AC
  Administered 2015-06-26: 0.5 mg via INTRAVENOUS
  Filled 2015-06-26: qty 1

## 2015-06-26 MED ORDER — ATORVASTATIN CALCIUM 80 MG PO TABS
80.0000 mg | ORAL_TABLET | Freq: Every day | ORAL | Status: DC
  Administered 2015-06-26 – 2015-06-27 (×2): 80 mg via ORAL
  Filled 2015-06-26: qty 1
  Filled 2015-06-26: qty 2

## 2015-06-26 MED ORDER — METOPROLOL TARTRATE 25 MG PO TABS
25.0000 mg | ORAL_TABLET | Freq: Two times a day (BID) | ORAL | Status: DC
  Administered 2015-06-26 – 2015-06-27 (×2): 25 mg via ORAL
  Filled 2015-06-26 (×4): qty 1

## 2015-06-26 NOTE — Progress Notes (Signed)
Pt has not experienced any dizziness or c/o cloudy vision since first episode doc at 1:15. Pt ambulated to BR with assistance.

## 2015-06-26 NOTE — Progress Notes (Signed)
Initial Nutrition Assessment  DOCUMENTATION CODES:   Obesity unspecified  INTERVENTION:  When diet is advanced, provide Ensure Enlive BID. Each supplement provides 350 kcals and 20 grams of protein.  NUTRITION DIAGNOSIS:   Inadequate oral intake related to poor appetite as evidenced by per patient/family report.  GOAL:   Patient will meet greater than or equal to 90% of their needs  MONITOR:   PO intake, Supplement acceptance, Diet advancement, Labs, Weight trends, Skin, I & O's  REASON FOR ASSESSMENT:   Malnutrition Screening Tool    ASSESSMENT:   Pt with a history of stent at [facility] in 2008, DM, HTN, HLD, FH CAD. He was doing well until Tuesday, 05/23. He developed intermittent upper L chest pain, pressure, 10/10 at times, not exertional. Episodes would be 5-10 minutes long, multiple episodes in a day. He rested Tuesday and Wednesday, but took no medications for the pain. The pain caused diaphoresis and SOB, no N&V. The pain was worse with deep inspiration and some positions. By some reports, the pain radiated up into his neck and back.  Pt reports eating BID PTA. He reports a poor appetite for the past year. He states he just did not eat if he did not feel like it. He had N/V 2 days ago.  Pt does not drink nutrition supplements at home but would like to receive Ensure (prefers strawberry). Will order when diet is advanced.  No recent wt hx in chart. Pt reports unintentionally losing 13.5% body weight in the past year. This is not significant for time frame. NFPE reveals no muscle or fat depletion, no edema.   Labs reviewed; CBGs 286. Meds reviewed.   Diet Order:  Diet NPO time specified  Skin:  Reviewed, no issues  Last BM:  5/23  Height:   Ht Readings from Last 1 Encounters:  06/25/15 6\' 2"  (1.88 m)    Weight:   Wt Readings from Last 1 Encounters:  06/25/15 238 lb (107.956 kg)    Ideal Body Weight:  86.4 kg  BMI:  Body mass index is 30.54  kg/(m^2).  Estimated Nutritional Needs:   Kcal:  1900-2100  Protein:  95-105   Fluid:  2 L  EDUCATION NEEDS:   No education needs identified at this time  Geoffery Lyons, [place] Dietetic Intern Pager [phone]

## 2015-06-26 NOTE — Progress Notes (Signed)
PROGRESS NOTE        PATIENT DETAILS Name: [name] Age: 50 y.o. Sex: male Date of Birth: Feb 18, 1965 Admit Date: 06/25/2015 Admitting Physician Theressa Millard, MD PCP:LEE,KEUNG, MD  Brief Narrative: Patient is a 50 y.o. male past medical history of diabetes, hypertension, dyslipidemia-and family history of CAD (father) admitted with chest pain with typical and atypical features. Continues to have intermittent chest pain throughout the night and into this morning. EKG and troponins negative so far. Cardiology consulted.  Subjective: Continues to have chest pain this morning-currently better with nitroglycerin infusion, IV morphine and Ativan.  Assessment/Plan: Active Problems: Chest pain: With both typical and atypical features-some musculoskeletal component-but is radiating to back and to the neck. Pulses are symmetrical, patient is not hypoxic, and EKG and enzymes are negative. Given patient's risk factors, patient is going to require at least to risk stratification with a stress test or even a cardiac catheterization. I have consulted cardiology-await formal evaluation. In the meantime, will continue IV nitroglycerin infusion, aspirin, metoprolol, lisinopril and statin.  Type II DM: CBGs stable, continue SSI, and tibial to hold oral hypoglycemic agents.  Dyslipidemia: Will discontinue Pravachol and start high intensity statin-Lipitor 80 mg.  Hypertension: Currently reasonably well-controlled with lisinopril, metoprolol and IV nitroglycerin.  DVT Prophylaxis: Prophylactic Lovenox  Code Status: Full code  Family Communication: None at bedside  Disposition Plan: Remain inpatient-home in 1-2 days  Antimicrobial agents: None  Procedures: None  CONSULTS:  cardiology  Time spent: 35 minutes-Greater than 50% of this time was spent in counseling, explanation of diagnosis, planning of further management, and coordination of  care.  MEDICATIONS: Anti-infectives    None      Scheduled Meds: . aspirin EC  325 mg Oral Daily  . atorvastatin  80 mg Oral q1800  . citalopram  20 mg Oral Daily  . enoxaparin (LOVENOX) injection  40 mg Subcutaneous QHS  . insulin aspart  0-5 Units Subcutaneous QHS  . insulin aspart  0-9 Units Subcutaneous TID WC  . lisinopril  40 mg Oral Daily  . metoprolol tartrate  25 mg Oral BID  . sodium chloride flush  3 mL Intravenous Q12H   Continuous Infusions: . nitroGLYCERIN 10 mcg/min (06/26/15 0926)   PRN Meds:.sodium chloride, acetaminophen **OR** acetaminophen, HYDROmorphone (DILAUDID) injection, morphine injection, ondansetron **OR** ondansetron (ZOFRAN) IV, oxyCODONE, sodium chloride flush   PHYSICAL EXAM: Vital signs: Filed Vitals:   06/26/15 0517 06/26/15 0838 06/26/15 0843 06/26/15 0844  BP: 128/73 138/92 129/81 133/82  Pulse: 60 60 64 53  Temp: 97.5 F (36.4 C)     TempSrc: Oral     Resp: 20     Height:      Weight:      SpO2: 100%      Filed Weights   06/25/15 1729 06/25/15 2240  Weight: 101.606 kg (224 lb) 107.956 kg (238 lb)   Body mass index is 30.54 kg/(m^2).   Gen Exam: Awake and alert with clear speech. Not in any distress  Neck: Supple, No JVD.   Chest: B/L Clear.   CVS: S1 S2 Regular, no murmurs.  Abdomen: soft, BS +, non tender, non distended.  Extremities: no edema, lower extremities warm to touch. Neurologic: Non Focal.   Skin: No Rash or lesions   Wounds: N/A.  LABORATORY DATA: CBC:  Recent Labs Lab 06/25/15 1907 06/26/15 0509  WBC 8.3 7.4  HGB 16.8 16.1  HCT 47.2 45.9  MCV 84.6 85.6  PLT 197 [id number]    Basic Metabolic Panel:  Recent Labs Lab 06/25/15 1907 06/26/15 0509  NA 135 137  K 4.1 4.1  CL 105 108  CO2 22 23  GLUCOSE 138* 168*  BUN 19 18  CREATININE 1.06 0.95  CALCIUM 9.6 9.3    GFR: Estimated Creatinine Clearance: 120.4 mL/min (by C-G formula based on Cr of 0.95).  Liver Function Tests: No results for  input(s): AST, ALT, ALKPHOS, BILITOT, PROT, ALBUMIN in the last 168 hours. No results for input(s): LIPASE, AMYLASE in the last 168 hours. No results for input(s): AMMONIA in the last 168 hours.  Coagulation Profile: No results for input(s): INR, PROTIME in the last 168 hours.  Cardiac Enzymes:  Recent Labs Lab 06/25/15 1907 06/25/15 2258 06/26/15 0509  TROPONINI <0.03 <0.03 <0.03    BNP (last 3 results) No results for input(s): PROBNP in the last 8760 hours.  HbA1C: No results for input(s): HGBA1C in the last 72 hours.  CBG:  Recent Labs Lab 06/25/15 2304  GLUCAP 287*    Lipid Profile: No results for input(s): CHOL, HDL, LDLCALC, TRIG, CHOLHDL, LDLDIRECT in the last 72 hours.  Thyroid Function Tests: No results for input(s): TSH, T4TOTAL, FREET4, T3FREE, THYROIDAB in the last 72 hours.  Anemia Panel: No results for input(s): VITAMINB12, FOLATE, FERRITIN, TIBC, IRON, RETICCTPCT in the last 72 hours.  Urine analysis:    Component Value Date/Time   COLORURINE YELLOW 07/02/2013 2112   APPEARANCEUR CLEAR 07/02/2013 2112   LABSPEC 1.036* 07/02/2013 2112   PHURINE 5.5 07/02/2013 2112   GLUCOSEU >1000* 07/02/2013 2112   HGBUR NEGATIVE 07/02/2013 2112   BILIRUBINUR NEGATIVE 07/02/2013 2112   [place] NEGATIVE 07/02/2013 2112   PROTEINUR NEGATIVE 07/02/2013 2112   UROBILINOGEN 0.2 07/02/2013 2112   NITRITE NEGATIVE 07/02/2013 2112   LEUKOCYTESUR NEGATIVE 07/02/2013 2112    Sepsis Labs: Lactic Acid, Venous No results found for: LATICACIDVEN  MICROBIOLOGY: No results found for this or any previous visit (from the past 240 hour(s)).  RADIOLOGY STUDIES/RESULTS: Dg Chest 2 View  06/25/2015  CLINICAL DATA:  Left-sided chest pain, shortness of breath and productive cough. EXAM: CHEST  2 VIEW COMPARISON:  02/25/2015. FINDINGS: Trachea is midline. Heart size normal. Lungs are clear. No pleural fluid. IMPRESSION: No acute findings. Electronically Signed   By: Lorin Picket M.D.   On: 06/25/2015 18:18       Oren Binet, MD  [facility] Pager:[phone]  If 7PM-7AM, please contact night-coverage [email or web address] Password [facility] 06/26/2015, 9:45 AM

## 2015-06-26 NOTE — Consult Note (Signed)
CARDIOLOGY CONSULT NOTE   Patient ID: [name] MRN: [id number] DOB/AGE: [birth year] 50 y.o.  Admit date: 06/25/2015  Primary Physician   Cher Nakai, MD Primary Cardiologist   New Reason for Consultation   Chest pain Consulting MD: Dr Sloan Leiter   [id number] Point is a 50 y.o. year old male with a history of stent at [facility] in 2008, DM, HTN, HLD, FH CAD.   [name] was doing well until Tuesday, 05/23. He developed intermittent upper L chest pain, pressure, 10/10 at times, not exertional. Episodes would be 5-10 minutes long, multiple episodes in a day. He rested Tuesday and Wednesday, but took no medications for the pain. The pain caused diaphoresis and SOB, no N&V. The pain was worse with deep inspiration and some positions. By some reports, the pain radiated up into his neck and back.  When the symptoms did not resolve, he came to the hospital. In the hospital, his ECG and ez are not acute. His pain has been decreased but not relieved by morphine, Zofran, SL NTG x 3, and oxycodone. According to the patient, the pain is intermittent, but every time the staff asked him about the pain, he was having pain. Currently, he appears anxious and in mild discomfort, he has chest wall tenderness in the upper L quadrant.   He had a recent cough, but no fevers.     Past Medical History  Diagnosis Date  . Diabetes mellitus   . Hypertension   . High cholesterol   . Depression      Past Surgical History  Procedure Laterality Date  . Knee surgery      right knee  . Coronary angioplasty with stent placement  2008    per pt, at [facility], not seen on 2010 cath  . Cardiac catheterization  2010    Dr Terrence Dupont, D1 20-25%, CFX 20-25%, RCA 10%, EF 50-55%    No Known Allergies  I have reviewed the patient's current medications . aspirin EC  325 mg Oral Daily  . atorvastatin  80 mg Oral q1800  . citalopram  20 mg Oral Daily  . enoxaparin (LOVENOX) injection  40 mg Subcutaneous QHS  .  insulin aspart  0-5 Units Subcutaneous QHS  . insulin aspart  0-9 Units Subcutaneous TID WC  . lisinopril  40 mg Oral Daily  . metoprolol tartrate  25 mg Oral BID  . sodium chloride flush  3 mL Intravenous Q12H   . nitroGLYCERIN 10 mcg/min (06/26/15 0926)   sodium chloride, acetaminophen **OR** acetaminophen, HYDROmorphone (DILAUDID) injection, morphine injection, ondansetron **OR** ondansetron (ZOFRAN) IV, oxyCODONE, sodium chloride flush  Prior to Admission medications   Medication Sig Start Date End Date Taking? Authorizing Provider  citalopram (CELEXA) 20 MG tablet Take 20 mg by mouth daily. 06/21/15  Yes Historical Provider, MD  FARXIGA 5 MG TABS tablet Take 5 mg by mouth daily. 06/19/15  Yes Historical Provider, MD  glimepiride (AMARYL) 4 MG tablet Take 4 mg by mouth 2 (two) times daily.   Yes Historical Provider, MD  lisinopril (PRINIVIL,ZESTRIL) 40 MG tablet Take 40 mg by mouth daily.  01/17/15  Yes Historical Provider, MD  lovastatin (MEVACOR) 40 MG tablet Take 40 mg by mouth 2 (two) times daily.    Yes Historical Provider, MD  metFORMIN (GLUCOPHAGE) 850 MG tablet Take 1 tablet (850 mg total) by mouth 3 (three) times daily. 07/03/13  Yes Jennifer Piepenbrink, PA-C  benzonatate (TESSALON) 100 MG capsule Take 1 capsule (100  mg total) by mouth every 8 (eight) hours. Patient not taking: Reported on 06/25/2015 02/25/15   Deno Etienne, DO  ondansetron (ZOFRAN ODT) 4 MG disintegrating tablet 4mg  ODT q4 hours prn nausea/vomit Patient not taking: Reported on 06/25/2015 02/25/15   Deno Etienne, DO     Social History   Social History  . Marital Status: Divorced    Spouse Name: N/A  . Number of Children: N/A  . Years of Education: N/A   Occupational History  . Materials management    Social History Main Topics  . Smoking status: Never Smoker   . Smokeless tobacco: Not on file  . Alcohol Use: No  . Drug Use: No  . Sexual Activity: Yes    Birth Control/ Protection: None   Other Topics Concern   . Not on file   Social History Narrative   Lives in [place] with girlfriend.    Family Status  Relation Status Death Age  . Mother Alive   . Father Deceased 13    MI   Family History  Problem Relation Age of Onset  . Hypertension Mother   . Heart failure Father   . Heart attack Father      ROS:  Full 14 point review of systems complete and found to be negative unless listed above.  Physical Exam: Blood pressure 133/82, pulse 53, temperature 97.5 F (36.4 C), temperature source Oral, resp. rate 20, height 6\' 2"  (1.88 m), weight 238 lb (107.956 kg), SpO2 100 %.  General: Well developed, well nourished, male in no acute distress Head: Eyes PERRLA, No xanthomas.   Normocephalic and atraumatic, oropharynx without edema or exudate. Dentition: good Lungs: clear bilaterally Heart: HRRR S1 S2, no rub/gallop, no murmur. pulses are 2+ all 4 extrem.   Neck: No carotid bruits. No lymphadenopathy.  JVD not elevated. Abdomen: Bowel sounds present, abdomen soft and non-tender without masses or hernias noted. Msk:  No spine or cva tenderness. No weakness, no joint deformities or effusions. Extremities: No clubbing or cyanosis. No edema. Both feet are tender to palpation, no wounds, injuries or joint abnormalities seen Neuro: Alert and oriented X 3. No focal deficits noted. Psych:  anxious affect, responds appropriately Skin: No rashes or lesions noted.  Labs:   Lab Results  Component Value Date   WBC 7.4 06/26/2015   HGB 16.1 06/26/2015   HCT 45.9 06/26/2015   MCV 85.6 06/26/2015   PLT 197 06/26/2015     Recent Labs Lab 06/26/15 0509  NA 137  K 4.1  CL 108  CO2 23  BUN 18  CREATININE 0.95  CALCIUM 9.3  GLUCOSE 168*    Recent Labs  06/25/15 1907 06/25/15 2258 06/26/15 0509  TROPONINI <0.03 <0.03 <0.03   Lab Results  Component Value Date   CHOL 100 07/16/2010   HDL 34* 07/16/2010   LDLCALC 39 07/16/2010   TRIG 133 07/16/2010   Lab Results  Component Value Date    HGBA1C 12.3 02/04/2012    ECG:  05/24 SR, ?early repol, seen in ECG from 2012.  CATH: Dr Terrence Dupont, 2010 FINDINGS: LV showed good LV systolic function. LVH, EF of 50-55%. Left main was patent. LAD was patent. High diagonal 1 has 20-25% ostial stenosis. Diagonal 2 and 3 were patent. Left circumflex has 20- 25% proximal stenosis. OM-1 is small, which is patent. OM-2 is very small. OM III is very small. RCA has 5-10% ostial stenosis. PDA and PLV branches were patent. The patient tolerated the procedure well. There  were no complications. The patient was transferred to the recovery room in stable condition.  Radiology:  Dg Chest 2 View  06/25/2015  CLINICAL DATA:  Left-sided chest pain, shortness of breath and productive cough. EXAM: CHEST  2 VIEW COMPARISON:  02/25/2015. FINDINGS: Trachea is midline. Heart size normal. Lungs are clear. No pleural fluid. IMPRESSION: No acute findings. Electronically Signed   By: Lorin Picket M.D.   On: 06/25/2015 18:18    ASSESSMENT AND PLAN:   The patient was seen today by Dr Debara Pickett, the patient evaluated and the data reviewed.  Active Problems:   Chest pain - He has ruled out for MI, but continues to have pain - NTG gtt added by IM - no real risk factors for PE - chest wall is tender and pain worse with deep inspiration - however, he reports a stent in the past, had some dz at cath 2010 and CRFs are not controlled. - continue pain management with IV NTG and morphine - add heparin - may need cath, will try to control pain for now.   Otherwise, per IM   Diabetes mellitus   High cholesterol   Hypertension   Signed: Lenoard Aden 06/26/2015 9:46 AM Beeper [id number]  Co-Sign MD

## 2015-06-26 NOTE — Care Management Note (Signed)
Case Management Note  Patient Details  Name: [name] MRN: [id number] Date of Birth: Apr 19, 1965  Subjective/Objective: 4` y/o m admitted w/chest pain. From home.                    Action/Plan:d/c plan home.   Expected Discharge Date:   (unknown)               Expected Discharge Plan:  Home/Self Care  In-House Referral:     Discharge planning Services  CM Consult  Post Acute Care Choice:    Choice offered to:     DME Arranged:    DME Agency:     HH Arranged:    HH Agency:     Status of Service:  In process, will continue to follow  Medicare Important Message Given:    Date Medicare IM Given:    Medicare IM give by:    Date Additional Medicare IM Given:    Additional Medicare Important Message give by:     If discussed at [place] of Stay Meetings, dates discussed:    Additional Comments:  Dessa Phi, RN 06/26/2015, 2:24 PM

## 2015-06-26 NOTE — Progress Notes (Signed)
  Echocardiogram 2D Echocardiogram has been performed.  [name] 06/26/2015, 3:39 PM

## 2015-06-26 NOTE — Progress Notes (Signed)
Patient was ambulating back from bathroom with Caryl Pina NT and became dizzy and shaky. Patient sat on bench in room when I was called in to assess.  Vitals stable but patient still very dizzy and complaining of cloudy vision. Dr. Sloan Leiter notified. Caryl Pina, NT and I were able to assist patient back to bed.  Will continue to monitor.

## 2015-06-27 ENCOUNTER — Encounter (HOSPITAL_COMMUNITY)
Admission: EM | Disposition: A | Discharge status: Home / Self Care | Payer: Self-pay | Source: Home / Self Care | Attending: Internal Medicine

## 2015-06-27 DIAGNOSIS — I2510 Atherosclerotic heart disease of native coronary artery without angina pectoris: Principal | ICD-10-CM

## 2015-06-27 DIAGNOSIS — R079 Chest pain, unspecified: Secondary | ICD-10-CM

## 2015-06-27 HISTORY — PX: CARDIAC CATHETERIZATION: SHX172

## 2015-06-27 LAB — GLUCOSE, CAPILLARY
GLUCOSE-CAPILLARY: 186 mg/dL — AB (ref 65–99)
GLUCOSE-CAPILLARY: 131 mg/dL — AB (ref 65–99)
GLUCOSE-CAPILLARY: 109 mg/dL — AB (ref 65–99)
Glucose-Capillary: 101 mg/dL — ABNORMAL HIGH (ref 65–99)
Glucose-Capillary: 253 mg/dL — ABNORMAL HIGH (ref 65–99)

## 2015-06-27 LAB — POCT ACTIVATED CLOTTING TIME: Activated Clotting Time: 296 seconds

## 2015-06-27 LAB — HEMOGLOBIN A1C
Hgb A1c MFr Bld: 11.2 % — ABNORMAL HIGH (ref 4.8–5.6)
Mean Plasma Glucose: 275 mg/dL

## 2015-06-27 LAB — PROTIME-INR
INR: 1.04 (ref 0.00–1.49)
PROTHROMBIN TIME: 13.8 s (ref 11.6–15.2)

## 2015-06-27 SURGERY — LEFT HEART CATH AND CORONARY ANGIOGRAPHY

## 2015-06-27 MED ORDER — SODIUM CHLORIDE 0.9% FLUSH
3.0000 mL | Freq: Two times a day (BID) | INTRAVENOUS | Status: DC
  Administered 2015-06-27: 3 mL via INTRAVENOUS

## 2015-06-27 MED ORDER — LIDOCAINE HCL (PF) 1 % IJ SOLN
INTRAMUSCULAR | Status: DC | PRN
  Administered 2015-06-27: 4 mL

## 2015-06-27 MED ORDER — LORAZEPAM 2 MG/ML IJ SOLN
0.5000 mg | Freq: Once | INTRAMUSCULAR | Status: AC | PRN
  Administered 2015-06-27: 0.5 mg via INTRAVENOUS
  Filled 2015-06-27: qty 1

## 2015-06-27 MED ORDER — ADENOSINE (DIAGNOSTIC) 140MCG/KG/MIN
INTRAVENOUS | Status: DC | PRN
  Administered 2015-06-27: 140 ug/kg/min via INTRAVENOUS

## 2015-06-27 MED ORDER — ASPIRIN 81 MG PO CHEW: 324.0000 mg | CHEWABLE_TABLET | ORAL | Status: DC

## 2015-06-27 MED ORDER — SODIUM CHLORIDE 0.9 % IV SOLN: 250.0000 mL | INTRAVENOUS | Status: DC | PRN

## 2015-06-27 MED ORDER — SODIUM CHLORIDE 0.9 % IV SOLN: INTRAVENOUS | Status: AC

## 2015-06-27 MED ORDER — ADENOSINE 12 MG/4ML IV SOLN
16.0000 mL | Freq: Once | INTRAVENOUS | Status: DC
  Filled 2015-06-27: qty 16

## 2015-06-27 MED ORDER — FENTANYL CITRATE (PF) 100 MCG/2ML IJ SOLN
INTRAMUSCULAR | Status: AC
  Filled 2015-06-27: qty 2

## 2015-06-27 MED ORDER — HEPARIN (PORCINE) IN NACL 2-0.9 UNIT/ML-% IJ SOLN
INTRAMUSCULAR | Status: AC
  Filled 2015-06-27: qty 1000

## 2015-06-27 MED ORDER — VERAPAMIL HCL 2.5 MG/ML IV SOLN
INTRAVENOUS | Status: AC
  Filled 2015-06-27: qty 2

## 2015-06-27 MED ORDER — SODIUM CHLORIDE 0.9% FLUSH: 3.0000 mL | INTRAVENOUS | Status: DC | PRN

## 2015-06-27 MED ORDER — HEPARIN (PORCINE) IN NACL 2-0.9 UNIT/ML-% IJ SOLN
INTRAMUSCULAR | Status: DC | PRN
  Administered 2015-06-27: 16:00:00

## 2015-06-27 MED ORDER — MIDAZOLAM HCL 2 MG/2ML IJ SOLN
INTRAMUSCULAR | Status: AC
  Filled 2015-06-27: qty 2

## 2015-06-27 MED ORDER — IOPAMIDOL (ISOVUE-370) INJECTION 76%
INTRAVENOUS | Status: DC | PRN
  Administered 2015-06-27: 80 mL

## 2015-06-27 MED ORDER — HEPARIN SODIUM (PORCINE) 1000 UNIT/ML IJ SOLN
INTRAMUSCULAR | Status: DC | PRN
  Administered 2015-06-27: 10000 [IU] via INTRAVENOUS

## 2015-06-27 MED ORDER — SODIUM CHLORIDE 0.9 % WEIGHT BASED INFUSION
3.0000 mL/kg/h | INTRAVENOUS | Status: AC
  Administered 2015-06-27: 3 mL/kg/h via INTRAVENOUS

## 2015-06-27 MED ORDER — MIDAZOLAM HCL 2 MG/2ML IJ SOLN
INTRAMUSCULAR | Status: DC | PRN
  Administered 2015-06-27: 1 mg via INTRAVENOUS
  Administered 2015-06-27: 2 mg via INTRAVENOUS

## 2015-06-27 MED ORDER — FENTANYL CITRATE (PF) 100 MCG/2ML IJ SOLN
INTRAMUSCULAR | Status: DC | PRN
  Administered 2015-06-27: 25 ug via INTRAVENOUS
  Administered 2015-06-27: 50 ug via INTRAVENOUS

## 2015-06-27 MED ORDER — SODIUM CHLORIDE 0.9 % WEIGHT BASED INFUSION: 1.0000 mL/kg/h | INTRAVENOUS | Status: DC

## 2015-06-27 MED ORDER — IOPAMIDOL (ISOVUE-370) INJECTION 76%
INTRAVENOUS | Status: AC
  Filled 2015-06-27: qty 100

## 2015-06-27 MED ORDER — SODIUM CHLORIDE 0.9% FLUSH: 3.0000 mL | Freq: Two times a day (BID) | INTRAVENOUS | Status: DC

## 2015-06-27 MED ORDER — HEPARIN SODIUM (PORCINE) 1000 UNIT/ML IJ SOLN
INTRAMUSCULAR | Status: AC
  Filled 2015-06-27: qty 1

## 2015-06-27 MED ORDER — VERAPAMIL HCL 2.5 MG/ML IV SOLN
INTRAVENOUS | Status: DC | PRN
  Administered 2015-06-27: 10 mL via INTRA_ARTERIAL

## 2015-06-27 MED ORDER — ASPIRIN 81 MG PO CHEW
324.0000 mg | CHEWABLE_TABLET | ORAL | Status: AC
  Administered 2015-06-27: 324 mg via ORAL
  Filled 2015-06-27: qty 4

## 2015-06-27 MED ORDER — LIDOCAINE HCL (PF) 1 % IJ SOLN
INTRAMUSCULAR | Status: AC
  Filled 2015-06-27: qty 30

## 2015-06-27 SURGICAL SUPPLY — 16 items
CATH INFINITI 5 FR JL3.5 (CATHETERS) ×3 IMPLANT
CATH INFINITI JR4 5F (CATHETERS) ×3 IMPLANT
CATH LAUNCHER 5F JL3 (CATHETERS) ×1 IMPLANT
CATH LAUNCHER 5F RADL (CATHETERS) ×1 IMPLANT
CATH VISTA GUIDE 6FR XBLAD3.0 (CATHETERS) ×3 IMPLANT
CATHETER LAUNCHER 5F JL3 (CATHETERS) ×3
CATHETER LAUNCHER 5F RADL (CATHETERS) ×3
DEVICE RAD COMP TR BAND LRG (VASCULAR PRODUCTS) ×3 IMPLANT
GLIDESHEATH SLEND SS 6F .021 (SHEATH) ×3 IMPLANT
GUIDEWIRE PRESSURE COMET II (WIRE) ×3 IMPLANT
KIT ESSENTIALS PG (KITS) ×3 IMPLANT
KIT HEART LEFT (KITS) ×3 IMPLANT
PACK CARDIAC CATHETERIZATION (CUSTOM PROCEDURE TRAY) ×3 IMPLANT
TRANSDUCER W/STOPCOCK (MISCELLANEOUS) ×3 IMPLANT
TUBING CIL FLEX 10 FLL-RA (TUBING) ×3 IMPLANT
WIRE SAFE-T 1.5MM-J .035X260CM (WIRE) ×3 IMPLANT

## 2015-06-27 NOTE — Progress Notes (Signed)
Pt's BP 107/68 and HR 56 while on nitro drip. Metoprolol held and MD made aware. Will continue to monitor and carry out any new orders.

## 2015-06-27 NOTE — H&P (View-Only) (Signed)
Patient Name: [name] Date of Encounter: 06/27/2015  Active Problems:   Chest pain   Diabetes mellitus   High cholesterol   Hypertension   Primary Cardiologist: new, Dr Debara Pickett Patient Profile: 50 y.o. year old male with a history of ?stent at [place] in 2008, non-obs dz by cath at [facility] 2010, uncontrolled DM/HTN/HLD, and FH CAD, was admitted for chest pain. Cath planned  SUBJECTIVE: Chest pain returned this am when IV Ntg was temporarily disconnected. Sx now gone. Anxious  OBJECTIVE Filed Vitals:   06/26/15 1807 06/26/15 2101 06/27/15 0458 06/27/15 0644  BP: 105/62 107/68 111/77   Pulse: 50 56 55   Temp: 98.2 F (36.8 C) 98.5 F (36.9 C) 97.9 F (36.6 C)   TempSrc:  Oral Oral   Resp:  16 16   Height:      Weight:    235 lb 14.4 oz (107.004 kg)  SpO2:  99% 100%     Intake/Output Summary (Last 24 hours) at 06/27/15 0734 Last data filed at 06/27/15 0535  Gross per 24 hour  Intake 166.28 ml  Output      0 ml  Net 166.28 ml   Filed Weights   06/25/15 1729 06/25/15 2240 06/27/15 0644  Weight: 224 lb (101.606 kg) 238 lb (107.956 kg) 235 lb 14.4 oz (107.004 kg)    PHYSICAL EXAM General: Well developed, well nourished, male in no acute distress. Head: Normocephalic, atraumatic.  Neck: Supple without bruits, JVD not elevated. Lungs:  Resp regular and unlabored, CTA. Heart: RRR, S1, S2, no S3, S4, or murmur; no rub. Abdomen: Soft, non-tender, non-distended, BS + x 4.  Extremities: No clubbing, cyanosis, edema.  Neuro: Alert and oriented X 3. Moves all extremities spontaneously. Psych: Normal affect.  LABS: CBC: Recent Labs  06/25/15 1907 06/26/15 0509  WBC 8.3 7.4  HGB 16.8 16.1  HCT 47.2 45.9  MCV 84.6 85.6  PLT 197 197   INR: Recent Labs  06/27/15 0502  INR [id number]   Basic Metabolic Panel: Recent Labs  06/25/15 1907 06/26/15 0509  NA 135 137  K 4.1 4.1  CL 105 108  CO2 22 23  GLUCOSE 138* 168*  BUN 19 18  CREATININE 1.06 0.95    CALCIUM 9.6 9.3   Liver Function Tests: Lab Results  Component Value Date   ALT 30 07/02/2013   AST 16 07/02/2013   ALKPHOS 93 07/02/2013   BILITOT 0.2* 07/02/2013   Cardiac Enzymes: Recent Labs  06/25/15 2258 06/26/15 0509 06/26/15 1038  TROPONINI <0.03 <0.03 <0.03   Hemoglobin A1C: Recent Labs  06/26/15 0509  HGBA1C 11.2*   Fasting Lipid Panel: Lab Results  Component Value Date   CHOL 100 07/16/2010   HDL 34* 07/16/2010   LDLCALC 39 07/16/2010   TRIG 133 07/16/2010   CHOLHDL 2.9 07/16/2010   TELE:  SR, S brady occ high 30s, mostly high 40s and 50s       Radiology/Studies: Dg Chest 2 View  06/25/2015  CLINICAL DATA:  Left-sided chest pain, shortness of breath and productive cough. EXAM: CHEST  2 VIEW COMPARISON:  02/25/2015. FINDINGS: Trachea is midline. Heart size normal. Lungs are clear. No pleural fluid. IMPRESSION: No acute findings. Electronically Signed   By: Lorin Picket M.D.   On: 06/25/2015 18:18     Current Medications:  . aspirin  324 mg Oral Pre-Cath  . aspirin EC  325 mg Oral Daily  . atorvastatin  80 mg Oral q1800  .  citalopram  20 mg Oral Daily  . enoxaparin (LOVENOX) injection  40 mg Subcutaneous QHS  . insulin aspart  0-5 Units Subcutaneous QHS  . insulin aspart  0-9 Units Subcutaneous TID WC  . lisinopril  40 mg Oral Daily  . metoprolol tartrate  25 mg Oral BID  . sodium chloride flush  3 mL Intravenous Q12H  . sodium chloride flush  3 mL Intravenous Q12H   . sodium chloride    . nitroGLYCERIN 10 mcg/min (06/27/15 0535)    ASSESSMENT AND PLAN: 1. Chest pain - He has ruled out for MI, but continues to have pain - finally pain-free on NTG gtt and MSO4 - no real risk factors for PE - chest wall is tender and pain worse with deep inspiration - he reports a stent in the past, had mild dz at cath 2010 and CRFs are not controlled. - continue pain management with IV NTG and morphine - cath today, give 1 dose of Ativan for anxiety - He  and girlfriend have no questions about procedure  Otherwise, per IM  Diabetes mellitus  High cholesterol  Hypertension  Signed, Rosaria Ferries , PA-C 7:34 AM 06/27/2015

## 2015-06-27 NOTE — Progress Notes (Signed)
Patient Name: [name] Date of Encounter: 06/27/2015  Active Problems:   Chest pain   Diabetes mellitus   High cholesterol   Hypertension   Primary Cardiologist: new, Dr Debara Pickett Patient Profile: 50 y.o. year old male with a history of ?stent at [facility] in 2008, non-obs dz by cath at [facility] 2010, uncontrolled DM/HTN/HLD, and FH CAD, was admitted for chest pain. Cath planned  SUBJECTIVE: Chest pain returned this am when IV Ntg was temporarily disconnected. Sx now gone. Anxious  OBJECTIVE Filed Vitals:   06/26/15 1807 06/26/15 2101 06/27/15 0458 06/27/15 0644  BP: 105/62 107/68 111/77   Pulse: 50 56 55   Temp: 98.2 F (36.8 C) 98.5 F (36.9 C) 97.9 F (36.6 C)   TempSrc:  Oral Oral   Resp:  16 16   Height:      Weight:    235 lb 14.4 oz (107.004 kg)  SpO2:  99% 100%     Intake/Output Summary (Last 24 hours) at 06/27/15 0734 Last data filed at 06/27/15 0535  Gross per 24 hour  Intake 166.28 ml  Output      0 ml  Net 166.28 ml   Filed Weights   06/25/15 1729 06/25/15 2240 06/27/15 0644  Weight: 224 lb (101.606 kg) 238 lb (107.956 kg) 235 lb 14.4 oz (107.004 kg)    PHYSICAL EXAM General: Well developed, well nourished, male in no acute distress. Head: Normocephalic, atraumatic.  Neck: Supple without bruits, JVD not elevated. Lungs:  Resp regular and unlabored, CTA. Heart: RRR, S1, S2, no S3, S4, or murmur; no rub. Abdomen: Soft, non-tender, non-distended, BS + x 4.  Extremities: No clubbing, cyanosis, edema.  Neuro: Alert and oriented X 3. Moves all extremities spontaneously. Psych: Normal affect.  LABS: CBC: Recent Labs  06/25/15 1907 06/26/15 0509  WBC 8.3 7.4  HGB 16.8 16.1  HCT 47.2 45.9  MCV 84.6 85.6  PLT 197 197   INR: Recent Labs  06/27/15 0502  INR [id number]   Basic Metabolic Panel: Recent Labs  06/25/15 1907 06/26/15 0509  NA 135 137  K 4.1 4.1  CL 105 108  CO2 22 23  GLUCOSE 138* 168*  BUN 19 18  CREATININE 1.06 0.95    CALCIUM 9.6 9.3   Liver Function Tests: Lab Results  Component Value Date   ALT 30 07/02/2013   AST 16 07/02/2013   ALKPHOS 93 07/02/2013   BILITOT 0.2* 07/02/2013   Cardiac Enzymes: Recent Labs  06/25/15 2258 06/26/15 0509 06/26/15 1038  TROPONINI <0.03 <0.03 <0.03   Hemoglobin A1C: Recent Labs  06/26/15 0509  HGBA1C 11.2*   Fasting Lipid Panel: Lab Results  Component Value Date   CHOL 100 07/16/2010   HDL 34* 07/16/2010   LDLCALC 39 07/16/2010   TRIG 133 07/16/2010   CHOLHDL 2.9 07/16/2010   TELE:  SR, S brady occ high 30s, mostly high 40s and 50s       Radiology/Studies: Dg Chest 2 View  06/25/2015  CLINICAL DATA:  Left-sided chest pain, shortness of breath and productive cough. EXAM: CHEST  2 VIEW COMPARISON:  02/25/2015. FINDINGS: Trachea is midline. Heart size normal. Lungs are clear. No pleural fluid. IMPRESSION: No acute findings. Electronically Signed   By: Lorin Picket M.D.   On: 06/25/2015 18:18     Current Medications:  . aspirin  324 mg Oral Pre-Cath  . aspirin EC  325 mg Oral Daily  . atorvastatin  80 mg Oral q1800  .  citalopram  20 mg Oral Daily  . enoxaparin (LOVENOX) injection  40 mg Subcutaneous QHS  . insulin aspart  0-5 Units Subcutaneous QHS  . insulin aspart  0-9 Units Subcutaneous TID WC  . lisinopril  40 mg Oral Daily  . metoprolol tartrate  25 mg Oral BID  . sodium chloride flush  3 mL Intravenous Q12H  . sodium chloride flush  3 mL Intravenous Q12H   . sodium chloride    . nitroGLYCERIN 10 mcg/min (06/27/15 0535)    ASSESSMENT AND PLAN: 1. Chest pain - He has ruled out for MI, but continues to have pain - finally pain-free on NTG gtt and MSO4 - no real risk factors for PE - chest wall is tender and pain worse with deep inspiration - he reports a stent in the past, had mild dz at cath 2010 and CRFs are not controlled. - continue pain management with IV NTG and morphine - cath today, give 1 dose of Ativan for anxiety - He  and girlfriend have no questions about procedure  Otherwise, per IM  Diabetes mellitus  High cholesterol  Hypertension  Signed, Rosaria Ferries , PA-C 7:34 AM 06/27/2015

## 2015-06-27 NOTE — Progress Notes (Addendum)
Inpatient Diabetes Program Recommendations  AACE/ADA: New Consensus Statement on Inpatient Glycemic Control (2015)  Target Ranges:  Prepandial:   less than 140 mg/dL      Peak postprandial:   less than 180 mg/dL (1-2 hours)      Critically ill patients:  140 - 180 mg/dL   Results for [name], [name] (MRN [id number]) as of 06/27/2015 12:40  Ref. Range 06/27/2015 07:51 06/27/2015 12:04  Glucose-Capillary Latest Ref Range: 65-99 mg/dL 186 (H) 131 (H)   Results for [name], [name] (MRN [id number]) as of 06/27/2015 12:40  Ref. Range 06/26/2015 05:09  Hemoglobin A1C Latest Ref Range: 4.8-5.6 % 11.2 (H)     Admit with: CP  History: DM2  Home DM Meds: Amaryl 4 mg bid       Farxiga 5 mg daily       Metformin 850 mg tid  Current Insulin Orders: Novolog Sensitive Correction Scale/ SSI (0-9 units) TID AC + HS      Spoke with patient about his current A1c of 11.2%.  Explained what an A1c is and what it measures.  Reminded patient that his goal A1c is 7% or less per ADA standards to prevent both acute and long-term complications.  Explained to patient the extreme importance of good glucose control at home.  Encouraged patient to check his CBGs at least bid at home (fasting and another check within the day) and to record all CBGs in a logbook for his PCP to review.  Patient told me that he was out of his three oral DM medications for about 1 month (the month of April).  Just started his three medications back about 2 weeks ago.  Patient stated his PCP knows this information and PCP wanted to wait until June to get a repeat Hemoglobin A1c drawn.  Has CBG meter at home and checks fasting glucose about 3-4 times per week.  Encouraged patient to check FBS everyday and to also check a 2-hour postprandial glucose several times a week to check to see what his glucose levels are running after meals.  Explained to patient that having a variety of blood glucose values to assess will help his PCP to determine if his  current medication regimen is adequate.  Patient stated understanding.     --Will follow patient during hospitalization--  Wyn Quaker RN, MSN, CDE Diabetes Coordinator Inpatient Glycemic Control Team Team Pager: [phone] (8a-5p)

## 2015-06-27 NOTE — Interval H&P Note (Signed)
Cath Lab Visit (complete for each Cath Lab visit)  Clinical Evaluation Leading to the Procedure:   ACS: Yes.    Non-ACS:    Anginal Classification: CCS III  Anti-ischemic medical therapy: Minimal Therapy (1 class of medications)  Non-Invasive Test Results: No non-invasive testing performed  Prior CABG: No previous CABG      History and Physical Interval Note:  06/27/2015 3:01 PM  [name]  has presented today for surgery, with the diagnosis of cp - angina  The various methods of treatment have been discussed with the patient and family. After consideration of risks, benefits and other options for treatment, the patient has consented to  Procedure(s): Left Heart Cath and Coronary Angiography (N/A) as a surgical intervention .  The patient's history has been reviewed, patient examined, no change in status, stable for surgery.  I have reviewed the patient's chart and labs.  Questions were answered to the patient's satisfaction.     Sherren Mocha

## 2015-06-27 NOTE — Progress Notes (Signed)
PROGRESS NOTE        PATIENT DETAILS Name: [name] Age: 50 y.o. Sex: male Date of Birth: Jul 24, 1965 Admit Date: 06/25/2015 Admitting Physician Theressa Millard, MD PCP:LEE,KEUNG, MD  Brief Narrative: Patient is a 50 y.o. male past medical history of diabetes, hypertension, dyslipidemia, CAD s/p ?PCI in the remote past-admitted with chest pain with typical and atypical features.Had persistent chest pain post admission, requiring IV Nitroglycerin-troponin/EKG negative, seen by Cardiology-plans are for [facility] 5/26.  Subjective: One episode of chest pain this am-but currently chest pain free. Remains on IV Nitroglycerin.  Assessment/Plan: Active Problems: Chest pain: With both typical and atypical features-troponin/EKG negative. Remains IV Nitroglycerin, seen by cardiology-plans are for LHC today. 2 D echo with preserved EF and no wall motion abnormality.Continue IV nitroglycerin infusion, aspirin, metoprolol, lisinopril and statin.  Type II DM: CBGs stable, continue SSI, and continue to hold oral hypoglycemic agents.  Dyslipidemia: Continue Lipitor 80 mg.  Hypertension: Currently reasonably well-controlled with lisinopril, metoprolol and IV nitroglycerin.  DVT Prophylaxis: Prophylactic Lovenox  Code Status: Full code  Family Communication: Girlfriend at bedside  Disposition Plan: Remain inpatient-home depending on LHC results  Antimicrobial agents: None  Procedures: None  CONSULTS:  cardiology  Time spent: 25 minutes-Greater than 50% of this time was spent in counseling, explanation of diagnosis, planning of further management, and coordination of care.  MEDICATIONS: Anti-infectives    None      Scheduled Meds: . aspirin EC  325 mg Oral Daily  . atorvastatin  80 mg Oral q1800  . citalopram  20 mg Oral Daily  . enoxaparin (LOVENOX) injection  40 mg Subcutaneous QHS  . insulin aspart  0-5 Units Subcutaneous QHS  . insulin aspart   0-9 Units Subcutaneous TID WC  . lisinopril  40 mg Oral Daily  . metoprolol tartrate  25 mg Oral BID  . sodium chloride flush  3 mL Intravenous Q12H  . sodium chloride flush  3 mL Intravenous Q12H   Continuous Infusions: . sodium chloride 1 mL/kg/hr (06/27/15 0930)  . nitroGLYCERIN 10 mcg/min (06/27/15 0535)   PRN Meds:.sodium chloride, sodium chloride, acetaminophen **OR** acetaminophen, HYDROmorphone (DILAUDID) injection, LORazepam, morphine injection, ondansetron **OR** ondansetron (ZOFRAN) IV, oxyCODONE, sodium chloride flush, sodium chloride flush   PHYSICAL EXAM: Vital signs: Filed Vitals:   06/26/15 2101 06/27/15 0458 06/27/15 0644 06/27/15 0940  BP: 107/68 111/77  126/70  Pulse: 56 55  49  Temp: 98.5 F (36.9 C) 97.9 F (36.6 C)    TempSrc: Oral Oral    Resp: 16 16    Height:      Weight:   107.004 kg (235 lb 14.4 oz)   SpO2: 99% 100%     Filed Weights   06/25/15 1729 06/25/15 2240 06/27/15 0644  Weight: 101.606 kg (224 lb) 107.956 kg (238 lb) 107.004 kg (235 lb 14.4 oz)   Body mass index is 30.28 kg/(m^2).   Gen Exam: Awake and alert with clear speech. Not in any distress  Neck: Supple, No JVD.   Chest: B/L Clear.   CVS: S1 S2 Regular, no murmurs.  Abdomen: soft, BS +, non tender, non distended.  Extremities: no edema, lower extremities warm to touch. Neurologic: Non Focal.   Skin: No Rash or lesions   Wounds: N/A.  LABORATORY DATA: CBC:  Recent Labs Lab 06/25/15 1907 06/26/15 0509  WBC 8.3 7.4  HGB 16.8 16.1  HCT 47.2 45.9  MCV 84.6 85.6  PLT 197 [id number]    Basic Metabolic Panel:  Recent Labs Lab 06/25/15 1907 06/26/15 0509  NA 135 137  K 4.1 4.1  CL 105 108  CO2 22 23  GLUCOSE 138* 168*  BUN 19 18  CREATININE 1.06 0.95  CALCIUM 9.6 9.3    GFR: Estimated Creatinine Clearance: 119.8 mL/min (by C-G formula based on Cr of 0.95).  Liver Function Tests: No results for input(s): AST, ALT, ALKPHOS, BILITOT, PROT, ALBUMIN in the last 168  hours. No results for input(s): LIPASE, AMYLASE in the last 168 hours. No results for input(s): AMMONIA in the last 168 hours.  Coagulation Profile:  Recent Labs Lab 06/27/15 0502  INR 1.04    Cardiac Enzymes:  Recent Labs Lab 06/25/15 1907 06/25/15 2258 06/26/15 0509 06/26/15 1038  TROPONINI <0.03 <0.03 <0.03 <0.03    BNP (last 3 results) No results for input(s): PROBNP in the last 8760 hours.  HbA1C:  Recent Labs  06/26/15 0509  HGBA1C 11.2*    CBG:  Recent Labs Lab 06/25/15 2304 06/26/15 1201 06/26/15 1637 06/26/15 2122 06/27/15 0751  GLUCAP 287* 131* 246* 271* 186*    Lipid Profile: No results for input(s): CHOL, HDL, LDLCALC, TRIG, CHOLHDL, LDLDIRECT in the last 72 hours.  Thyroid Function Tests: No results for input(s): TSH, T4TOTAL, FREET4, T3FREE, THYROIDAB in the last 72 hours.  Anemia Panel: No results for input(s): VITAMINB12, FOLATE, FERRITIN, TIBC, IRON, RETICCTPCT in the last 72 hours.  Urine analysis:    Component Value Date/Time   COLORURINE YELLOW 07/02/2013 2112   APPEARANCEUR CLEAR 07/02/2013 2112   LABSPEC 1.036* 07/02/2013 2112   PHURINE 5.5 07/02/2013 2112   GLUCOSEU >1000* 07/02/2013 2112   HGBUR NEGATIVE 07/02/2013 2112   BILIRUBINUR NEGATIVE 07/02/2013 2112   [place] NEGATIVE 07/02/2013 2112   PROTEINUR NEGATIVE 07/02/2013 2112   UROBILINOGEN 0.2 07/02/2013 2112   NITRITE NEGATIVE 07/02/2013 2112   LEUKOCYTESUR NEGATIVE 07/02/2013 2112    Sepsis Labs: Lactic Acid, Venous No results found for: LATICACIDVEN  MICROBIOLOGY: No results found for this or any previous visit (from the past 240 hour(s)).  RADIOLOGY STUDIES/RESULTS: Dg Chest 2 View  06/25/2015  CLINICAL DATA:  Left-sided chest pain, shortness of breath and productive cough. EXAM: CHEST  2 VIEW COMPARISON:  02/25/2015. FINDINGS: Trachea is midline. Heart size normal. Lungs are clear. No pleural fluid. IMPRESSION: No acute findings. Electronically Signed    By: Lorin Picket M.D.   On: 06/25/2015 18:18     LOS: 1 day   Oren Binet, MD  [facility] Pager:[phone]  If 7PM-7AM, please contact night-coverage [email or web address] Password [email or web address] 06/27/2015, 11:25 AM

## 2015-06-28 ENCOUNTER — Encounter: Payer: Self-pay | Admitting: Family Medicine

## 2015-06-28 DIAGNOSIS — R0789 Other chest pain: Secondary | ICD-10-CM

## 2015-06-28 LAB — GLUCOSE, CAPILLARY: GLUCOSE-CAPILLARY: 168 mg/dL — AB (ref 65–99)

## 2015-06-28 MED ORDER — ASPIRIN 325 MG PO TBEC: 325.0000 mg | DELAYED_RELEASE_TABLET | Freq: Every day | ORAL | Status: DC

## 2015-06-28 MED ORDER — METOPROLOL TARTRATE 25 MG PO TABS
25.0000 mg | ORAL_TABLET | Freq: Two times a day (BID) | ORAL | Status: DC
Start: 2015-06-28 — End: 2022-01-12

## 2015-06-28 MED ORDER — ATORVASTATIN CALCIUM 80 MG PO TABS: 80.0000 mg | ORAL_TABLET | Freq: Every day | ORAL | Status: DC

## 2015-06-28 NOTE — Progress Notes (Signed)
Patient ID: [name], male   DOB: November 12, 1965, 50 y.o.   MRN: [id number]    Subjective:  Denies SSCP, palpitations or Dyspnea   Objective:  Filed Vitals:   06/27/15 1750 06/27/15 1941 06/27/15 2100 06/28/15 0500  BP: 138/88 133/75 133/75 110/54  Pulse: 56 72 64 56  Temp:   98.2 F (36.8 C) 97.7 F (36.5 C)  TempSrc:   Oral Oral  Resp: 16 20 20 20   Height:      Weight:    107.185 kg (236 lb 4.8 oz)  SpO2: 98% 98% 98% 100%    Intake/Output from previous day:  Intake/Output Summary (Last 24 hours) at 06/28/15 [id number] Last data filed at 06/27/15 2100  Gross per 24 hour  Intake    480 ml  Output      0 ml  Net    480 ml    Physical Exam: Affect appropriate Healthy:  appears stated age HEENT: normal Neck supple with no adenopathy JVP normal no bruits no thyromegaly Lungs clear with no wheezing and good diaphragmatic motion Heart:  S1/S2 no murmur, no rub, gallop or click PMI normal Abdomen: benighn, BS positve, no tenderness, no AAA no bruit.  No HSM or HJR Distal pulses intact with no bruits No edema Neuro non-focal Skin warm and dry No muscular weakness Right radial A no hematoma  Lab Results: Basic Metabolic Panel:  Recent Labs  06/25/15 1907 06/26/15 0509  NA 135 137  K 4.1 4.1  CL 105 108  CO2 22 23  GLUCOSE 138* 168*  BUN 19 18  CREATININE 1.06 0.95  CALCIUM 9.6 9.3   CBC:  Recent Labs  06/25/15 1907 06/26/15 0509  WBC 8.3 7.4  HGB 16.8 16.1  HCT 47.2 45.9  MCV 84.6 85.6  PLT 197 197   Cardiac Enzymes:  Recent Labs  06/25/15 2258 06/26/15 0509 06/26/15 1038  TROPONINI <0.03 <0.03 <0.03   Hemoglobin A1C:  Recent Labs  06/26/15 0509  HGBA1C 11.2*    Imaging: No results found.  Cardiac Studies:  ECG:  SR no acute ST/T wave changes    Telemetry: NSR no arrhythmia   Echo:   Medications:   . aspirin EC  325 mg Oral Daily  . atorvastatin  80 mg Oral q1800  . citalopram  20 mg Oral Daily  . enoxaparin (LOVENOX)  injection  40 mg Subcutaneous QHS  . insulin aspart  0-5 Units Subcutaneous QHS  . insulin aspart  0-9 Units Subcutaneous TID WC  . lisinopril  40 mg Oral Daily  . metoprolol tartrate  25 mg Oral BID  . sodium chloride flush  3 mL Intravenous Q12H  . sodium chloride flush  3 mL Intravenous Q12H     . nitroGLYCERIN Stopped (06/27/15 1510)    Assessment/Plan:  Chest Pain: reviewed cath [facility] yessterday 50% LAD negative FFR no flow limiting lesions. Continue risk factor modification Chol:  On statin HTN:  On ACE DM:  Discussed low carb diet.  Target hemoglobin A1c is 6.5 or less.  Continue current medications.  Ok to d/c home today   Jenkins Rouge 06/28/2015, 7:13 AM

## 2015-06-28 NOTE — Discharge Instructions (Signed)
Nonspecific Chest Pain  °Chest pain can be caused by many different conditions. There is always a chance that your pain could be related to something serious, such as a heart attack or a blood clot in your lungs. Chest pain can also be caused by conditions that are not life-threatening. If you have chest pain, it is very important to follow up with your health care provider. °CAUSES  °Chest pain can be caused by: °· Heartburn. °· Pneumonia or bronchitis. °· Anxiety or stress. °· Inflammation around your heart (pericarditis) or lung (pleuritis or pleurisy). °· A blood clot in your lung. °· A collapsed lung (pneumothorax). It can develop suddenly on its own (spontaneous pneumothorax) or from trauma to the chest. °· Shingles infection (varicella-zoster virus). °· Heart attack. °· Damage to the bones, muscles, and cartilage that make up your chest wall. This can include: °¨ Bruised bones due to injury. °¨ Strained muscles or cartilage due to frequent or repeated coughing or overwork. °¨ Fracture to one or more ribs. °¨ Sore cartilage due to inflammation (costochondritis). °RISK FACTORS  °Risk factors for chest pain may include: °· Activities that increase your risk for trauma or injury to your chest. °· Respiratory infections or conditions that cause frequent coughing. °· Medical conditions or overeating that can cause heartburn. °· Heart disease or family history of heart disease. °· Conditions or health behaviors that increase your risk of developing a blood clot. °· Having had chicken pox (varicella zoster). °SIGNS AND SYMPTOMS °Chest pain can feel like: °· Burning or tingling on the surface of your chest or deep in your chest. °· Crushing, pressure, aching, or squeezing pain. °· Dull or sharp pain that is worse when you move, cough, or take a deep breath. °· Pain that is also felt in your back, neck, shoulder, or arm, or pain that spreads to any of these areas. °Your chest pain may come and go, or it may stay  constant. °DIAGNOSIS °Lab tests or other studies may be needed to find the cause of your pain. Your health care provider may have you take a test called an ambulatory ECG (electrocardiogram). An ECG records your heartbeat patterns at the time the test is performed. You may also have other tests, such as: °· Transthoracic echocardiogram (TTE). During echocardiography, sound waves are used to create a picture of all of the heart structures and to look at how blood flows through your heart. °· Transesophageal echocardiogram (TEE). This is a more advanced imaging test that obtains images from inside your body. It allows your health care provider to see your heart in finer detail. °· Cardiac monitoring. This allows your health care provider to monitor your heart rate and rhythm in real time. °· Holter monitor. This is a portable device that records your heartbeat and can help to diagnose abnormal heartbeats. It allows your health care provider to track your heart activity for several days, if needed. °· Stress tests. These can be done through exercise or by taking medicine that makes your heart beat more quickly. °· Blood tests. °· Imaging tests. °TREATMENT  °Your treatment depends on what is causing your chest pain. Treatment may include: °· Medicines. These may include: °¨ Acid blockers for heartburn. °¨ Anti-inflammatory medicine. °¨ Pain medicine for inflammatory conditions. °¨ Antibiotic medicine, if an infection is present. °¨ Medicines to dissolve blood clots. °¨ Medicines to treat coronary artery disease. °· Supportive care for conditions that do not require medicines. This may include: °¨ Resting. °¨ Applying heat   or cold packs to injured areas. °¨ Limiting activities until pain decreases. °HOME CARE INSTRUCTIONS °· If you were prescribed an antibiotic medicine, finish it all even if you start to feel better. °· Avoid any activities that bring on chest pain. °· Do not use any tobacco products, including  cigarettes, chewing tobacco, or electronic cigarettes. If you need help quitting, ask your health care provider. °· Do not drink alcohol. °· Take medicines only as directed by your health care provider. °· Keep all follow-up visits as directed by your health care provider. This is important. This includes any further testing if your chest pain does not go away. °· If heartburn is the cause for your chest pain, you may be told to keep your head raised (elevated) while sleeping. This reduces the chance that acid will go from your stomach into your esophagus. °· Make lifestyle changes as directed by your health care provider. These may include: °¨ Getting regular exercise. Ask your health care provider to suggest some activities that are safe for you. °¨ Eating a heart-healthy diet. A registered dietitian can help you to learn healthy eating options. °¨ Maintaining a healthy weight. °¨ Managing diabetes, if necessary. °¨ Reducing stress. °SEEK MEDICAL CARE IF: °· Your chest pain does not go away after treatment. °· You have a rash with blisters on your chest. °· You have a fever. °SEEK IMMEDIATE MEDICAL CARE IF:  °· Your chest pain is worse. °· You have an increasing cough, or you cough up blood. °· You have severe abdominal pain. °· You have severe weakness. °· You faint. °· You have chills. °· You have sudden, unexplained chest discomfort. °· You have sudden, unexplained discomfort in your arms, back, neck, or jaw. °· You have shortness of breath at any time. °· You suddenly start to sweat, or your skin gets clammy. °· You feel nauseous or you vomit. °· You suddenly feel light-headed or dizzy. °· Your heart begins to beat quickly, or it feels like it is skipping beats. °These symptoms may represent a serious problem that is an emergency. Do not wait to see if the symptoms will go away. Get medical help right away. Call your local emergency services (911 in the [place] Do not drive yourself to the hospital. °  °This  information is not intended to replace advice given to you by your health care provider. Make sure you discuss any questions you have with your health care provider. °  °Document Released: 10/28/2004 Document Revised: 02/08/2014 Document Reviewed: 08/24/2013 °[employer]vier Interactive Patient Education ©2016 [employer] ° °

## 2015-06-28 NOTE — Discharge Summary (Signed)
Physician Discharge Summary  [name] [id number] DOB: 1965/11/27 DOA: 06/25/2015  PCP: Cher Nakai, MD  Admit date: 06/25/2015 Discharge date: 06/28/2015  Time spent: 35 minutes  Recommendations for Outpatient Follow-up:  1. Follow up OP with Cardiology 2. Needs OP dietary screening 3. Symptoms were not entirely in keeping with cardiogenic chest pain-had point tenderness at the left shoulder 4. -Given exercises for rotator cuff pathology and instructed to follow-up with primary physician   Discharge Diagnoses:  Active Problems:   Chest pain   Diabetes mellitus   High cholesterol   Hypertension   Discharge Condition: Improved  Diet recommendation: Heart healthy low-salt  Filed Weights   06/25/15 2240 06/27/15 0644 06/28/15 0500  Weight: 107.956 kg (238 lb) 107.004 kg (235 lb 14.4 oz) 107.185 kg (236 lb 4.8 oz)    History of present illness:  50 y.o. male past medical history of diabetes, hypertension, dyslipidemia, CAD s/p ?PCI in the remote past-admitted with chest pain with typical and atypical features.Had persistent chest pain post admission, requiring IV Nitroglycerin-troponin/EKG negative, seen by Cardiology  Hospital Course:  Patient underwent cardiac cath which showed only mild to moderate disease and no specific blockages and therefore no intervention was performed He was given on discharge aspirin metoprolol ACE inhibitor and statin high-dose He can continue his metformin for her diabetes control as an outpatient and he will follow up with his primary care physician  It was noted on day of discharge she had significant rotator cuff pathology after an episode of somnambulism about a week ago where he hit a wall while sleepwalking and hurt her shoulder I feel that this may be a component of his pain and so taught him some exercises for shoulder rehabilitation  Procedures:  Cardiac cath  Conclusion     Ost RCA lesion, 25% stenosed.  Prox LAD lesion, 50%  stenosed.  Ost Ramus lesion, 30% stenosed.  Mid LAD lesion, 50% stenosed.  1. Moderate LAD stenosis with negative FFR evaluation 2. Mild nonobstructive LCx and RCA stenosis  Recommend: medical therapy for risk reduction and treatment of nonobstructive CAD     Consultations:    Cardiology  Discharge Exam: Filed Vitals:   06/27/15 2100 06/28/15 0500  BP: 133/75 110/54  Pulse: 64 56  Temp: 98.2 F (36.8 C) 97.7 F (36.5 C)  Resp: 20 20    General: EOMI NCAT Cardiovascular: S1-S2 no murmur rub or gallop Respiratory: Clinically clear Beer empty can positive Impingement signs positive Neer sign positive  Hawkins sign positive  Discharge Instructions   Discharge Instructions    Diet - low sodium heart healthy    Complete by:  As directed      Discharge instructions    Complete by:  As directed   You probably did not have cardiac chest pain I would however suggest that you change your statin medicine as well as take aspirin 325 daily Please do the exercises that I suggested for you.  I will give you a a handout Get lab-work at your primary MD office Continue Diabetes medicines I will give you a work note Exercise 30 min a day-aerobic--IN ADDITION TO HOUSEHOLD things Watch diet Good [place] for summer and have a nice Memorial day weekend     Increase activity slowly    Complete by:  As directed           Discharge Medication List as of 06/28/2015  8:15 AM    START taking these medications   Details  aspirin EC 325  MG EC tablet Take 1 tablet (325 mg total) by mouth daily., Starting 06/28/2015, Until Discontinued, Normal    atorvastatin (LIPITOR) 80 MG tablet Take 1 tablet (80 mg total) by mouth daily at 6 PM., Starting 06/28/2015, Until Discontinued, Normal    metoprolol tartrate (LOPRESSOR) 25 MG tablet Take 1 tablet (25 mg total) by mouth 2 (two) times daily., Starting 06/28/2015, Until Discontinued, Normal      CONTINUE these medications which have NOT CHANGED    Details  citalopram (CELEXA) 20 MG tablet Take 20 mg by mouth daily., Starting 06/21/2015, Until Discontinued, Historical Med    FARXIGA 5 MG TABS tablet Take 5 mg by mouth daily., Starting 06/19/2015, Until Discontinued, Historical Med    glimepiride (AMARYL) 4 MG tablet Take 4 mg by mouth 2 (two) times daily., Until Discontinued, Historical Med    lisinopril (PRINIVIL,ZESTRIL) 40 MG tablet Take 40 mg by mouth daily. , Starting 01/17/2015, Until Discontinued, Historical Med    metFORMIN (GLUCOPHAGE) 850 MG tablet Take 1 tablet (850 mg total) by mouth 3 (three) times daily., Starting 07/03/2013, Until Discontinued, Print    benzonatate (TESSALON) 100 MG capsule Take 1 capsule (100 mg total) by mouth every 8 (eight) hours., Starting 02/25/2015, Until Discontinued, Print    ondansetron (ZOFRAN ODT) 4 MG disintegrating tablet 4mg  ODT q4 hours prn nausea/vomit, Print      STOP taking these medications     lovastatin (MEVACOR) 40 MG tablet        No Known Allergies Follow-up Information    Follow up with [facility], [place]. Schedule an appointment as soon as possible for a visit in 1 week.   Specialty:  Internal Medicine   Contact information:   [place] [address] [phone]        The results of significant diagnostics from this hospitalization (including imaging, microbiology, ancillary and laboratory) are listed below for reference.    Significant Diagnostic Studies: Dg Chest 2 View  06/25/2015  CLINICAL DATA:  Left-sided chest pain, shortness of breath and productive cough. EXAM: CHEST  2 VIEW COMPARISON:  02/25/2015. FINDINGS: Trachea is midline. Heart size normal. Lungs are clear. No pleural fluid. IMPRESSION: No acute findings. Electronically Signed   By: Lorin Picket M.D.   On: 06/25/2015 18:18    Microbiology: No results found for this or any previous visit (from the past 240 hour(s)).   Labs: Basic Metabolic Panel:  Recent Labs Lab  06/25/15 1907 06/26/15 0509  NA 135 137  K 4.1 4.1  CL 105 108  CO2 22 23  GLUCOSE 138* 168*  BUN 19 18  CREATININE 1.06 0.95  CALCIUM 9.6 9.3   Liver Function Tests: No results for input(s): AST, ALT, ALKPHOS, BILITOT, PROT, ALBUMIN in the last 168 hours. No results for input(s): LIPASE, AMYLASE in the last 168 hours. No results for input(s): AMMONIA in the last 168 hours. CBC:  Recent Labs Lab 06/25/15 1907 06/26/15 0509  WBC 8.3 7.4  HGB 16.8 16.1  HCT 47.2 45.9  MCV 84.6 85.6  PLT 197 197   Cardiac Enzymes:  Recent Labs Lab 06/25/15 1907 06/25/15 2258 06/26/15 0509 06/26/15 1038  TROPONINI <0.03 <0.03 <0.03 <0.03   BNP: BNP (last 3 results) No results for input(s): BNP in the last 8760 hours.  ProBNP (last 3 results) No results for input(s): PROBNP in the last 8760 hours.  CBG:  Recent Labs Lab 06/27/15 1204 06/27/15 1552 06/27/15 1706 06/27/15 2207 06/28/15 [id number]  GLUCAP 131* 101* 109* 253* 168*       Signed:  Nita Sells MD   [facility] 06/28/2015, 2:47 PM

## 2015-07-01 ENCOUNTER — Encounter (HOSPITAL_COMMUNITY): Payer: Self-pay | Admitting: Cardiovascular Disease

## 2015-12-04 ENCOUNTER — Emergency Department (HOSPITAL_COMMUNITY): Payer: Managed Care, Other (non HMO)

## 2015-12-04 ENCOUNTER — Emergency Department (HOSPITAL_COMMUNITY)
Admission: EM | Admit: 2015-12-04 | Discharge: 2015-12-04 | Disposition: A | Discharge status: Home / Self Care | Payer: Managed Care, Other (non HMO) | Attending: Emergency Medicine | Admitting: Emergency Medicine

## 2015-12-04 ENCOUNTER — Encounter (HOSPITAL_COMMUNITY): Payer: Self-pay | Admitting: *Deleted

## 2015-12-04 DIAGNOSIS — R791 Abnormal coagulation profile: Secondary | ICD-10-CM | POA: Diagnosis not present

## 2015-12-04 DIAGNOSIS — D496 Neoplasm of unspecified behavior of brain: Secondary | ICD-10-CM

## 2015-12-04 DIAGNOSIS — E119 Type 2 diabetes mellitus without complications: Secondary | ICD-10-CM | POA: Insufficient documentation

## 2015-12-04 DIAGNOSIS — Z8673 Personal history of transient ischemic attack (TIA), and cerebral infarction without residual deficits: Secondary | ICD-10-CM | POA: Insufficient documentation

## 2015-12-04 DIAGNOSIS — R2681 Unsteadiness on feet: Secondary | ICD-10-CM | POA: Insufficient documentation

## 2015-12-04 DIAGNOSIS — Z7984 Long term (current) use of oral hypoglycemic drugs: Secondary | ICD-10-CM | POA: Insufficient documentation

## 2015-12-04 DIAGNOSIS — R531 Weakness: Secondary | ICD-10-CM | POA: Diagnosis not present

## 2015-12-04 DIAGNOSIS — I639 Cerebral infarction, unspecified: Secondary | ICD-10-CM

## 2015-12-04 DIAGNOSIS — Z955 Presence of coronary angioplasty implant and graft: Secondary | ICD-10-CM | POA: Insufficient documentation

## 2015-12-04 DIAGNOSIS — I160 Hypertensive urgency: Secondary | ICD-10-CM | POA: Diagnosis not present

## 2015-12-04 DIAGNOSIS — R51 Headache: Secondary | ICD-10-CM | POA: Diagnosis present

## 2015-12-04 DIAGNOSIS — R930 Abnormal findings on diagnostic imaging of skull and head, not elsewhere classified: Secondary | ICD-10-CM | POA: Insufficient documentation

## 2015-12-04 DIAGNOSIS — G510 Bell's palsy: Secondary | ICD-10-CM

## 2015-12-04 DIAGNOSIS — Z7982 Long term (current) use of aspirin: Secondary | ICD-10-CM | POA: Insufficient documentation

## 2015-12-04 LAB — DIFFERENTIAL
Basophils Absolute: 0 10*3/uL (ref 0.0–0.1)
Basophils Relative: 0 %
EOS PCT: 0 %
Eosinophils Absolute: 0 10*3/uL (ref 0.0–0.7)
LYMPHS PCT: 39 %
Lymphs Abs: 3.1 10*3/uL (ref 0.7–4.0)
MONO ABS: 0.3 10*3/uL (ref 0.1–1.0)
MONOS PCT: 4 %
NEUTROS ABS: 4.6 10*3/uL (ref 1.7–7.7)
Neutrophils Relative %: 57 %

## 2015-12-04 LAB — COMPREHENSIVE METABOLIC PANEL
ALK PHOS: 55 U/L (ref 38–126)
ALT: 23 U/L (ref 17–63)
ANION GAP: 9 (ref 5–15)
AST: 20 U/L (ref 15–41)
Albumin: 4.2 g/dL (ref 3.5–5.0)
BILIRUBIN TOTAL: 0.5 mg/dL (ref 0.3–1.2)
BUN: 14 mg/dL (ref 6–20)
CALCIUM: 10 mg/dL (ref 8.9–10.3)
CO2: 21 mmol/L — ABNORMAL LOW (ref 22–32)
CREATININE: 1.01 mg/dL (ref 0.61–1.24)
Chloride: 109 mmol/L (ref 101–111)
Glucose, Bld: 118 mg/dL — ABNORMAL HIGH (ref 65–99)
Potassium: 4.4 mmol/L (ref 3.5–5.1)
SODIUM: 139 mmol/L (ref 135–145)
TOTAL PROTEIN: 7.8 g/dL (ref 6.5–8.1)

## 2015-12-04 LAB — CBC
HEMATOCRIT: 50.9 % (ref 39.0–52.0)
Hemoglobin: 18.3 g/dL — ABNORMAL HIGH (ref 13.0–17.0)
MCH: 31.1 pg (ref 26.0–34.0)
MCHC: 36 g/dL (ref 30.0–36.0)
MCV: 86.4 fL (ref 78.0–100.0)
PLATELETS: 203 10*3/uL (ref 150–400)
RBC: 5.89 MIL/uL — AB (ref 4.22–5.81)
RDW: 13.9 % (ref 11.5–15.5)
WBC: 8.1 10*3/uL (ref 4.0–10.5)

## 2015-12-04 LAB — CBG MONITORING, ED
GLUCOSE-CAPILLARY: 121 mg/dL — AB (ref 65–99)
GLUCOSE-CAPILLARY: 100 mg/dL — AB (ref 65–99)

## 2015-12-04 LAB — PROTIME-INR
INR: 0.98
PROTHROMBIN TIME: 13 s (ref 11.4–15.2)

## 2015-12-04 LAB — I-STAT TROPONIN, ED: TROPONIN I, POC: 0 ng/mL (ref 0.00–0.08)

## 2015-12-04 LAB — APTT: aPTT: 29 seconds (ref 24–36)

## 2015-12-04 MED ORDER — MORPHINE SULFATE (PF) 4 MG/ML IV SOLN
4.0000 mg | Freq: Once | INTRAVENOUS | Status: AC
  Administered 2015-12-04: 4 mg via INTRAVENOUS
  Filled 2015-12-04: qty 1

## 2015-12-04 MED ORDER — GADOBENATE DIMEGLUMINE 529 MG/ML IV SOLN
20.0000 mL | Freq: Once | INTRAVENOUS | Status: AC | PRN
  Administered 2015-12-04: 20 mL via INTRAVENOUS

## 2015-12-04 MED ORDER — PREDNISONE 20 MG PO TABS
60.0000 mg | ORAL_TABLET | Freq: Once | ORAL | Status: AC
  Administered 2015-12-04: 60 mg via ORAL
  Filled 2015-12-04: qty 3

## 2015-12-04 MED ORDER — LABETALOL HCL 5 MG/ML IV SOLN
5.0000 mg | Freq: Once | INTRAVENOUS | Status: DC
  Filled 2015-12-04: qty 4

## 2015-12-04 MED ORDER — LABETALOL HCL 5 MG/ML IV SOLN
5.0000 mg | Freq: Once | INTRAVENOUS | Status: AC
  Administered 2015-12-04: 5 mg via INTRAVENOUS
  Filled 2015-12-04: qty 4

## 2015-12-04 MED ORDER — ACYCLOVIR 200 MG PO CAPS
400.0000 mg | ORAL_CAPSULE | Freq: Once | ORAL | Status: AC
  Administered 2015-12-04: 400 mg via ORAL
  Filled 2015-12-04: qty 2

## 2015-12-04 MED ORDER — ASPIRIN 81 MG PO CHEW
324.0000 mg | CHEWABLE_TABLET | Freq: Once | ORAL | Status: DC
  Filled 2015-12-04: qty 4

## 2015-12-04 MED ORDER — PREDNISONE 20 MG PO TABS: ORAL_TABLET | ORAL | 0 refills | Status: DC

## 2015-12-04 MED ORDER — ONDANSETRON HCL 4 MG/2ML IJ SOLN
4.0000 mg | Freq: Once | INTRAMUSCULAR | Status: AC
  Administered 2015-12-04: 4 mg via INTRAVENOUS
  Filled 2015-12-04: qty 2

## 2015-12-04 MED ORDER — ACYCLOVIR 400 MG PO TABS: 400.0000 mg | ORAL_TABLET | Freq: Four times a day (QID) | ORAL | 0 refills | Status: DC

## 2015-12-04 NOTE — ED Notes (Signed)
Pt stayed at 98% RA while ambulating

## 2015-12-04 NOTE — ED Provider Notes (Signed)
[facility] DEPT Provider Note   CSN: [id number] Arrival date & time: 12/04/15  1149     History   Chief Complaint Chief Complaint  Patient presents with  . Stroke Symptoms    HPI Bhavin Nielsen is a 50 y.o. male.  HPI   50 year old male with history of hypertension, hyper lipidemia, diabetes, depression presents for evaluation of stroke symptoms. Patient reports waking up at 2 AM this morning with an intense sharp throbbing left-sided headache. Blurred vision the left eye, tingling and numbness sensation to the left side of face, difficulty moving his left arm as well as having some neck pain. Endorse dizziness, and having trouble walking due to leaning to the left. He went to see his eye specialist but was sent here for a stroke workup. He denies any prior history of stroke. Denies any recent alcohol use or drug use. Headache is now mild to moderate in intensity. Having trouble with drooling and formulating words. Patient denies fever, chills, URI symptoms, chest pain, short of breath, abdominal pain or back pain. Recently had a left eye cataract surgery.  Past Medical History:  Diagnosis Date  . Depression   . Diabetes mellitus   . High cholesterol   . Hypertension     Patient Active Problem List   Diagnosis Date Noted  . Chest pain 06/25/2015  . Diabetes mellitus 06/25/2015  . High cholesterol 06/25/2015  . Hypertension 06/25/2015  . Pain in the chest   . Type 2 diabetes mellitus without complication, without long-term current use of insulin [facility])     Past Surgical History:  Procedure Laterality Date  . CARDIAC CATHETERIZATION  2010   Dr Terrence Dupont, D1 20-25%, CFX 20-25%, RCA 10%, EF 50-55%  . CARDIAC CATHETERIZATION N/A 06/27/2015   Procedure: Left Heart Cath and Coronary Angiography;  Surgeon: Sherren Mocha, MD;  Location: [place] [facility];  Service: Cardiovascular;  Laterality: N/A;  . CARDIAC CATHETERIZATION N/A 06/27/2015   Procedure: Intravascular Pressure  Wire/FFR Study;  Surgeon: Sherren Mocha, MD;  Location: [place] [facility];  Service: Cardiovascular;  Laterality: N/A;  . CORONARY ANGIOPLASTY WITH STENT PLACEMENT  2008   per pt, at [place], not seen on 2010 cath  . KNEE SURGERY     right knee       Home Medications    Prior to Admission medications   Medication Sig Start Date End Date Taking? Authorizing Provider  aspirin EC 325 MG EC tablet Take 1 tablet (325 mg total) by mouth daily. 06/28/15   Nita Sells, MD  atorvastatin (LIPITOR) 80 MG tablet Take 1 tablet (80 mg total) by mouth daily at 6 PM. 06/28/15   Nita Sells, MD  benzonatate (TESSALON) 100 MG capsule Take 1 capsule (100 mg total) by mouth every 8 (eight) hours. Patient not taking: Reported on 06/25/2015 02/25/15   Deno Etienne, DO  citalopram (CELEXA) 20 MG tablet Take 20 mg by mouth daily. 06/21/15   Historical Provider, MD  FARXIGA 5 MG TABS tablet Take 5 mg by mouth daily. 06/19/15   Historical Provider, MD  glimepiride (AMARYL) 4 MG tablet Take 4 mg by mouth 2 (two) times daily.    Historical Provider, MD  lisinopril (PRINIVIL,ZESTRIL) 40 MG tablet Take 40 mg by mouth daily.  01/17/15   Historical Provider, MD  metFORMIN (GLUCOPHAGE) 850 MG tablet Take 1 tablet (850 mg total) by mouth 3 (three) times daily. 07/03/13   Jennifer Piepenbrink, PA-C  metoprolol tartrate (LOPRESSOR) 25 MG tablet Take 1  tablet (25 mg total) by mouth 2 (two) times daily. 06/28/15   Nita Sells, MD  ondansetron (ZOFRAN ODT) 4 MG disintegrating tablet 4mg  ODT q4 hours prn nausea/vomit Patient not taking: Reported on 06/25/2015 02/25/15   Deno Etienne, DO    Family History Family History  Problem Relation Age of Onset  . Hypertension Mother   . Heart failure Father   . Heart attack Father     Social History Social History  Substance Use Topics  . Smoking status: Never Smoker  . Smokeless tobacco: Not on file  . Alcohol use No     Allergies   Review of patient's  allergies indicates no known allergies.   Review of Systems Review of Systems  All other systems reviewed and are negative.    Physical Exam Updated Vital Signs BP 160/99 (BP Location: Right Arm)   Pulse 79   Temp 98 F (36.7 C) (Oral)   Resp 18   Ht 6\' 2"  (1.88 m)   Wt 117.9 kg   SpO2 100%   BMI 33.38 kg/m   Physical Exam  Constitutional: He is oriented to person, place, and time. He appears well-developed and well-nourished.  HENT:  Head: Normocephalic and atraumatic.  Right Ear: External ear normal.  Left Ear: External ear normal.  Nose: Nose normal.  Mouth/Throat: Oropharynx is clear and moist.  Eyes: Conjunctivae are normal.  Right pupil is 2 mm and reactive, left pupil is 4 mm and nonreactive.  Neck: Neck supple.  No carotid bruit. Decreased neck range of motion secondary to discomfort.  Cardiovascular: Normal rate and regular rhythm.   Pulmonary/Chest: Effort normal and breath sounds normal.  Abdominal: Soft.  Neurological: He is alert and oriented to person, place, and time.  Neurologic exam:  Speech is delay, pupils unequal, extraocular movements is limited with L visual field cut  L side facial droop Follows commands, moves all extremities x4, decreased sensation and grip strength to L arm/L leg Coordination is poor Rapid alternating movements delay Mild left pronator drift Gait unsteady, left leaning   Skin: No rash noted.  Psychiatric: He has a normal mood and affect.  Nursing note and vitals reviewed.    ED Treatments / Results  Labs (all labs ordered are listed, but only abnormal results are displayed) Labs Reviewed  CBC - Abnormal; Notable for the following:       Result Value   RBC 5.89 (*)    Hemoglobin 18.3 (*)    All other components within normal limits  COMPREHENSIVE METABOLIC PANEL - Abnormal; Notable for the following:    CO2 21 (*)    Glucose, Bld 118 (*)    All other components within normal limits  CBG MONITORING, ED -  Abnormal; Notable for the following:    Glucose-Capillary 121 (*)    All other components within normal limits  CBG MONITORING, ED - Abnormal; Notable for the following:    Glucose-Capillary 100 (*)    All other components within normal limits  PROTIME-INR  APTT  DIFFERENTIAL  I-STAT TROPOININ, ED    EKG  EKG Interpretation  Date/Time:  Thursday December 04 2015 12:42:16 EDT Ventricular Rate:  74 PR Interval:    QRS Duration: 95 QT Interval:  369 QTC Calculation: 410 R Axis:   -12 Text Interpretation:  Sinus rhythm Left ventricular hypertrophy ECG unchanged from prior No STEMI Confirmed by Sherry Ruffing MD, CHRISTOPHER [phone]) on 12/04/2015 4:31:54 PM       Radiology Ct Head Wo Contrast  Result Date: 12/04/2015 CLINICAL DATA:  Left-sided headache with left-sided facial numbness. Unsteady gait EXAM: CT HEAD WITHOUT CONTRAST TECHNIQUE: Contiguous axial images were obtained from the base of the skull through the vertex without intravenous contrast. COMPARISON:  October 23, 2003 FINDINGS: Brain: The ventricles are normal in size and configuration. There is no intracranial mass, hemorrhage, extra-axial fluid collection, or midline shift. Gray-white compartments are normal. No acute infarct evident. Vascular: There is no hyperdense vessel. There is no evident vascular calcification. Skull: The bony calvarium appears intact. Sinuses/Orbits: Visualized paranasal sinuses are clear. Orbits appear symmetric bilaterally. Patient appears to have had surgical removal of the lens on the left. Other: There is chronic opacification in the inferior right mastoid region. Mastoids elsewhere clear in stable. IMPRESSION: No intracranial mass, hemorrhage, or extra-axial fluid collection. Gray-white compartments appear normal. Patient appears to have had a previous removal of the lens of the left eye. 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 Other mastoids appear clear. Electronically Signed   By: Lowella Grip III M.D.   On: 12/04/2015 12:39   Mr Virgel Paling [id number] Contrast  Result Date: 12/04/2015 CLINICAL DATA:  50 year old male who awoke at 0200 hours with headache nausea and vomiting and N noted difficulty closing his left eye along with left extremity and facial weakness. "Left eye surgery a few days ago" (not otherwise specified at the time of this report). Initial encounter. EXAM: MRI HEAD WITHOUT AND WITH CONTRAST MRI ORBITS WITHOUT AND WITH CONTRAST MRA HEAD WITHOUT CONTRAST MRA NECK WITHOUT AND WITH CONTRAST TECHNIQUE: Multiplanar, multiecho pulse sequences of the brain and surrounding structures were obtained without and with intravenous contrast. Angiographic images of the Circle of Willis were obtained using MRA technique without intravenous contrast. Angiographic images of the neck were obtained using MRA technique without and with intravenous contrast. Carotid stenosis measurements (when applicable) are obtained utilizing NASCET criteria, using the distal internal carotid diameter as the denominator. CONTRAST:  19mL MULTIHANCE GADOBENATE DIMEGLUMINE 529 MG/ML IV SOLN COMPARISON:  Head CT without contrast 1224 hours today. FINDINGS: MRI HEAD FINDINGS Brain: No restricted diffusion to suggest acute infarction. No midline shift, mass effect, evidence of mass lesion, ventriculomegaly, extra-axial collection or acute intracranial hemorrhage. Cervicomedullary junction and pituitary are within normal limits. Cerebral volume is normal. Pear[place] Cables and white matter signal is within normal limits throughout the brain. No encephalomalacia or chronic cerebral blood products. Deep gray matter nuclei, brainstem, and cerebellum appear normal. No abnormal enhancement identified. No dural thickening. The cavernous sinus appears normal. Vascular: Major intracranial vascular flow voids are preserved, dominant appearing distal left vertebral artery. Skull and upper cervical spine: Negative. Normal bone marrow signal.  Sinuses/Orbits: Orbits are described below. Paranasal sinuses are clear. There is right greater than left mastoid air cell fluid. Negative nasopharynx. Other: No definite abnormal IAC enhancement. Aside from the mastoid fluid the visible internal auditory structures appear normal. Stylomastoid foramina appear normal. The visible parotid glands appear normal. There is questionable asymmetric hyper enhancement of the mastoid segment of the left facial nerve (series 24, image 13). Incidental posterior left scalp lipoma. No acute scalp soft tissue findings. MRI ORBIT FINDINGS Postoperative changes to the left globe. The other left orbits soft tissues appear normal. No inflammation or abnormal enhancement in the left orbit. Symmetric and normal appearing optic nerves. The right globe is normal. Normal cavernous sinus. No suprasellar mass or mass effect. Trace paranasal sinus mucosal thickening. Superficial periorbital soft tissues appear normal. Right greater than left mastoid effusions are  again noted. The thin postcontrast images do not include the internal auditory canals. MRA HEAD FINDINGS Antegrade flow in the posterior circulation with dominant distal left vertebral artery. Both PICA origins appear patent. The non dominant distal right vertebral artery is diminutive but patent to the vertebrobasilar junction. No basilar stenosis. Dominant appearing right AICA. Normal SCA and PCA origins. Posterior communicating arteries are diminutive or absent. Bilateral PCA branches are within normal limits. Antegrade flow in both ICA siphons. Normal ophthalmic artery origins. No siphon stenosis. Normal carotid termini, MCA and ACA origins. Anterior communicating artery and visualized ACA branches are within normal limits. MCA M1 segments, MCA bifurcations, and visualized bilateral MCA branches are within normal limits. MRA NECK FINDINGS Precontrast time-of-flight neck MRA images reveal antegrade flow in both carotid and  vertebral arteries in the neck. The left vertebral artery is dominant an the right is somewhat diminutive. Post-contrast neck MRA images reveal evidence of a 3 vessel arch configuration, but accidentally the entire arch is not included. Both common carotid arteries appear normal, the left CCA origin is not included. Both carotid bifurcations are widely patent. There is mild irregularity at the left ICA origin without stenosis. Both ICAs otherwise appear normal. The left vertebral artery is dominant with a normal origin. No left vertebral stenosis. The non dominant right vertebral artery origin also appears normal. The proximal right subclavian artery is normal. No right vertebral stenosis. IMPRESSION: 1.  Normal MRI appearance of the brain. 2. Postoperative changes to the left globe but otherwise normal MRI appearance of the orbits. 3. Right greater than left mastoid effusions, nonspecific but most often postinflammatory. Questionable hyper-enhancement of the mastoid segment of the left seventh nerve raising the possibility of neuritis. However, no definite abnormal left IAC fundal enhancement which is typical of Bell's palsy. 4. Negative head and neck MRA. Minimal left ICA origin atherosclerosis. Dominant left vertebral artery. Electronically Signed   By: Genevie Ann M.D.   On: 12/04/2015 16:01   Mr Angiogram Neck W Or Wo Contrast  Result Date: 12/04/2015 CLINICAL DATA:  50 year old male who awoke at 0200 hours with headache nausea and vomiting and N noted difficulty closing his left eye along with left extremity and facial weakness. "Left eye surgery a few days ago" (not otherwise specified at the time of this report). Initial encounter. EXAM: MRI HEAD WITHOUT AND WITH CONTRAST MRI ORBITS WITHOUT AND WITH CONTRAST MRA HEAD WITHOUT CONTRAST MRA NECK WITHOUT AND WITH CONTRAST TECHNIQUE: Multiplanar, multiecho pulse sequences of the brain and surrounding structures were obtained without and with intravenous contrast.  Angiographic images of the Circle of Willis were obtained using MRA technique without intravenous contrast. Angiographic images of the neck were obtained using MRA technique without and with intravenous contrast. Carotid stenosis measurements (when applicable) are obtained utilizing NASCET criteria, using the distal internal carotid diameter as the denominator. CONTRAST:  2mL MULTIHANCE GADOBENATE DIMEGLUMINE 529 MG/ML IV SOLN COMPARISON:  Head CT without contrast 1224 hours today. FINDINGS: MRI HEAD FINDINGS Brain: No restricted diffusion to suggest acute infarction. No midline shift, mass effect, evidence of mass lesion, ventriculomegaly, extra-axial collection or acute intracranial hemorrhage. Cervicomedullary junction and pituitary are within normal limits. Cerebral volume is normal. Pear[place] Cables and white matter signal is within normal limits throughout the brain. No encephalomalacia or chronic cerebral blood products. Deep gray matter nuclei, brainstem, and cerebellum appear normal. No abnormal enhancement identified. No dural thickening. The cavernous sinus appears normal. Vascular: Major intracranial vascular flow voids are preserved, dominant appearing distal left  vertebral artery. Skull and upper cervical spine: Negative. Normal bone marrow signal. Sinuses/Orbits: Orbits are described below. Paranasal sinuses are clear. There is right greater than left mastoid air cell fluid. Negative nasopharynx. Other: No definite abnormal IAC enhancement. Aside from the mastoid fluid the visible internal auditory structures appear normal. Stylomastoid foramina appear normal. The visible parotid glands appear normal. There is questionable asymmetric hyper enhancement of the mastoid segment of the left facial nerve (series 24, image 13). Incidental posterior left scalp lipoma. No acute scalp soft tissue findings. MRI ORBIT FINDINGS Postoperative changes to the left globe. The other left orbits soft tissues appear normal. No  inflammation or abnormal enhancement in the left orbit. Symmetric and normal appearing optic nerves. The right globe is normal. Normal cavernous sinus. No suprasellar mass or mass effect. Trace paranasal sinus mucosal thickening. Superficial periorbital soft tissues appear normal. Right greater than left mastoid effusions are again noted. The thin postcontrast images do not include the internal auditory canals. MRA HEAD FINDINGS Antegrade flow in the posterior circulation with dominant distal left vertebral artery. Both PICA origins appear patent. The non dominant distal right vertebral artery is diminutive but patent to the vertebrobasilar junction. No basilar stenosis. Dominant appearing right AICA. Normal SCA and PCA origins. Posterior communicating arteries are diminutive or absent. Bilateral PCA branches are within normal limits. Antegrade flow in both ICA siphons. Normal ophthalmic artery origins. No siphon stenosis. Normal carotid termini, MCA and ACA origins. Anterior communicating artery and visualized ACA branches are within normal limits. MCA M1 segments, MCA bifurcations, and visualized bilateral MCA branches are within normal limits. MRA NECK FINDINGS Precontrast time-of-flight neck MRA images reveal antegrade flow in both carotid and vertebral arteries in the neck. The left vertebral artery is dominant an the right is somewhat diminutive. Post-contrast neck MRA images reveal evidence of a 3 vessel arch configuration, but accidentally the entire arch is not included. Both common carotid arteries appear normal, the left CCA origin is not included. Both carotid bifurcations are widely patent. There is mild irregularity at the left ICA origin without stenosis. Both ICAs otherwise appear normal. The left vertebral artery is dominant with a normal origin. No left vertebral stenosis. The non dominant right vertebral artery origin also appears normal. The proximal right subclavian artery is normal. No right  vertebral stenosis. IMPRESSION: 1.  Normal MRI appearance of the brain. 2. Postoperative changes to the left globe but otherwise normal MRI appearance of the orbits. 3. Right greater than left mastoid effusions, nonspecific but most often postinflammatory. Questionable hyper-enhancement of the mastoid segment of the left seventh nerve raising the possibility of neuritis. However, no definite abnormal left IAC fundal enhancement which is typical of Bell's palsy. 4. Negative head and neck MRA. Minimal left ICA origin atherosclerosis. Dominant left vertebral artery. Electronically Signed   By: Genevie Ann M.D.   On: 12/04/2015 16:01   Mr Jeri Cos [id number] Contrast  Result Date: 12/04/2015 CLINICAL DATA:  50 year old male who awoke at 0200 hours with headache nausea and vomiting and N noted difficulty closing his left eye along with left extremity and facial weakness. "Left eye surgery a few days ago" (not otherwise specified at the time of this report). Initial encounter. EXAM: MRI HEAD WITHOUT AND WITH CONTRAST MRI ORBITS WITHOUT AND WITH CONTRAST MRA HEAD WITHOUT CONTRAST MRA NECK WITHOUT AND WITH CONTRAST TECHNIQUE: Multiplanar, multiecho pulse sequences of the brain and surrounding structures were obtained without and with intravenous contrast. Angiographic images of the Circle of Willis  were obtained using MRA technique without intravenous contrast. Angiographic images of the neck were obtained using MRA technique without and with intravenous contrast. Carotid stenosis measurements (when applicable) are obtained utilizing NASCET criteria, using the distal internal carotid diameter as the denominator. CONTRAST:  19mL MULTIHANCE GADOBENATE DIMEGLUMINE 529 MG/ML IV SOLN COMPARISON:  Head CT without contrast 1224 hours today. FINDINGS: MRI HEAD FINDINGS Brain: No restricted diffusion to suggest acute infarction. No midline shift, mass effect, evidence of mass lesion, ventriculomegaly, extra-axial collection or acute  intracranial hemorrhage. Cervicomedullary junction and pituitary are within normal limits. Cerebral volume is normal. Pear[place] Cables and white matter signal is within normal limits throughout the brain. No encephalomalacia or chronic cerebral blood products. Deep gray matter nuclei, brainstem, and cerebellum appear normal. No abnormal enhancement identified. No dural thickening. The cavernous sinus appears normal. Vascular: Major intracranial vascular flow voids are preserved, dominant appearing distal left vertebral artery. Skull and upper cervical spine: Negative. Normal bone marrow signal. Sinuses/Orbits: Orbits are described below. Paranasal sinuses are clear. There is right greater than left mastoid air cell fluid. Negative nasopharynx. Other: No definite abnormal IAC enhancement. Aside from the mastoid fluid the visible internal auditory structures appear normal. Stylomastoid foramina appear normal. The visible parotid glands appear normal. There is questionable asymmetric hyper enhancement of the mastoid segment of the left facial nerve (series 24, image 13). Incidental posterior left scalp lipoma. No acute scalp soft tissue findings. MRI ORBIT FINDINGS Postoperative changes to the left globe. The other left orbits soft tissues appear normal. No inflammation or abnormal enhancement in the left orbit. Symmetric and normal appearing optic nerves. The right globe is normal. Normal cavernous sinus. No suprasellar mass or mass effect. Trace paranasal sinus mucosal thickening. Superficial periorbital soft tissues appear normal. Right greater than left mastoid effusions are again noted. The thin postcontrast images do not include the internal auditory canals. MRA HEAD FINDINGS Antegrade flow in the posterior circulation with dominant distal left vertebral artery. Both PICA origins appear patent. The non dominant distal right vertebral artery is diminutive but patent to the vertebrobasilar junction. No basilar stenosis.  Dominant appearing right AICA. Normal SCA and PCA origins. Posterior communicating arteries are diminutive or absent. Bilateral PCA branches are within normal limits. Antegrade flow in both ICA siphons. Normal ophthalmic artery origins. No siphon stenosis. Normal carotid termini, MCA and ACA origins. Anterior communicating artery and visualized ACA branches are within normal limits. MCA M1 segments, MCA bifurcations, and visualized bilateral MCA branches are within normal limits. MRA NECK FINDINGS Precontrast time-of-flight neck MRA images reveal antegrade flow in both carotid and vertebral arteries in the neck. The left vertebral artery is dominant an the right is somewhat diminutive. Post-contrast neck MRA images reveal evidence of a 3 vessel arch configuration, but accidentally the entire arch is not included. Both common carotid arteries appear normal, the left CCA origin is not included. Both carotid bifurcations are widely patent. There is mild irregularity at the left ICA origin without stenosis. Both ICAs otherwise appear normal. The left vertebral artery is dominant with a normal origin. No left vertebral stenosis. The non dominant right vertebral artery origin also appears normal. The proximal right subclavian artery is normal. No right vertebral stenosis. IMPRESSION: 1.  Normal MRI appearance of the brain. 2. Postoperative changes to the left globe but otherwise normal MRI appearance of the orbits. 3. Right greater than left mastoid effusions, nonspecific but most often postinflammatory. Questionable hyper-enhancement of the mastoid segment of the left seventh nerve raising the  possibility of neuritis. However, no definite abnormal left IAC fundal enhancement which is typical of Bell's palsy. 4. Negative head and neck MRA. Minimal left ICA origin atherosclerosis. Dominant left vertebral artery. Electronically Signed   By: Genevie Ann M.D.   On: 12/04/2015 16:01   Dg Chest Portable 1 View  Result Date:  12/04/2015 CLINICAL DATA:  Initial evaluation for acute difficulty breathing. EXAM: PORTABLE CHEST 1 VIEW COMPARISON:  Prior radiograph from 06/25/2015. FINDINGS: Mild accentuation of the cardiac silhouette related AP technique and shallow lung inflation. Heart size remains within normal limits. Mediastinal silhouette within normal limits. Lungs are hypoinflated. Diffuse pulmonary vascular congestion, felt to largely be related to shallow lung inflation. No overt pulmonary edema. No pleural effusion. No focal infiltrates. No pneumothorax. No acute osseus abnormality. IMPRESSION: Shallow lung inflation with diffuse bronchovascular crowding. No other active cardiopulmonary disease. Electronically Signed   By: Jeannine Boga M.D.   On: 12/04/2015 16:17   Mr Rosealee Albee [id number] Contrast  Result Date: 12/04/2015 CLINICAL DATA:  50 year old male who awoke at 0200 hours with headache nausea and vomiting and N noted difficulty closing his left eye along with left extremity and facial weakness. "Left eye surgery a few days ago" (not otherwise specified at the time of this report). Initial encounter. EXAM: MRI HEAD WITHOUT AND WITH CONTRAST MRI ORBITS WITHOUT AND WITH CONTRAST MRA HEAD WITHOUT CONTRAST MRA NECK WITHOUT AND WITH CONTRAST TECHNIQUE: Multiplanar, multiecho pulse sequences of the brain and surrounding structures were obtained without and with intravenous contrast. Angiographic images of the Circle of Willis were obtained using MRA technique without intravenous contrast. Angiographic images of the neck were obtained using MRA technique without and with intravenous contrast. Carotid stenosis measurements (when applicable) are obtained utilizing NASCET criteria, using the distal internal carotid diameter as the denominator. CONTRAST:  58mL MULTIHANCE GADOBENATE DIMEGLUMINE 529 MG/ML IV SOLN COMPARISON:  Head CT without contrast 1224 hours today. FINDINGS: MRI HEAD FINDINGS Brain: No restricted diffusion to suggest  acute infarction. No midline shift, mass effect, evidence of mass lesion, ventriculomegaly, extra-axial collection or acute intracranial hemorrhage. Cervicomedullary junction and pituitary are within normal limits. Cerebral volume is normal. Pearline Cables and white matter signal is within normal limits throughout the brain. No encephalomalacia or chronic cerebral blood products. Deep gray matter nuclei, brainstem, and cerebellum appear normal. No abnormal enhancement identified. No dural thickening. The cavernous sinus appears normal. Vascular: Major intracranial vascular flow voids are preserved, dominant appearing distal left vertebral artery. Skull and upper cervical spine: Negative. Normal bone marrow signal. Sinuses/Orbits: Orbits are described below. Paranasal sinuses are clear. There is right greater than left mastoid air cell fluid. Negative nasopharynx. Other: No definite abnormal IAC enhancement. Aside from the mastoid fluid the visible internal auditory structures appear normal. Stylomastoid foramina appear normal. The visible parotid glands appear normal. There is questionable asymmetric hyper enhancement of the mastoid segment of the left facial nerve (series 24, image 13). Incidental posterior left scalp lipoma. No acute scalp soft tissue findings. MRI ORBIT FINDINGS Postoperative changes to the left globe. The other left orbits soft tissues appear normal. No inflammation or abnormal enhancement in the left orbit. Symmetric and normal appearing optic nerves. The right globe is normal. Normal cavernous sinus. No suprasellar mass or mass effect. Trace paranasal sinus mucosal thickening. Superficial periorbital soft tissues appear normal. Right greater than left mastoid effusions are again noted. The thin postcontrast images do not include the internal auditory canals. MRA HEAD FINDINGS Antegrade flow in the posterior circulation  with dominant distal left vertebral artery. Both PICA origins appear patent. The non  dominant distal right vertebral artery is diminutive but patent to the vertebrobasilar junction. No basilar stenosis. Dominant appearing right AICA. Normal SCA and PCA origins. Posterior communicating arteries are diminutive or absent. Bilateral PCA branches are within normal limits. Antegrade flow in both ICA siphons. Normal ophthalmic artery origins. No siphon stenosis. Normal carotid termini, MCA and ACA origins. Anterior communicating artery and visualized ACA branches are within normal limits. MCA M1 segments, MCA bifurcations, and visualized bilateral MCA branches are within normal limits. MRA NECK FINDINGS Precontrast time-of-flight neck MRA images reveal antegrade flow in both carotid and vertebral arteries in the neck. The left vertebral artery is dominant an the right is somewhat diminutive. Post-contrast neck MRA images reveal evidence of a 3 vessel arch configuration, but accidentally the entire arch is not included. Both common carotid arteries appear normal, the left CCA origin is not included. Both carotid bifurcations are widely patent. There is mild irregularity at the left ICA origin without stenosis. Both ICAs otherwise appear normal. The left vertebral artery is dominant with a normal origin. No left vertebral stenosis. The non dominant right vertebral artery origin also appears normal. The proximal right subclavian artery is normal. No right vertebral stenosis. IMPRESSION: 1.  Normal MRI appearance of the brain. 2. Postoperative changes to the left globe but otherwise normal MRI appearance of the orbits. 3. Right greater than left mastoid effusions, nonspecific but most often postinflammatory. Questionable hyper-enhancement of the mastoid segment of the left seventh nerve raising the possibility of neuritis. However, no definite abnormal left IAC fundal enhancement which is typical of Bell's palsy. 4. Negative head and neck MRA. Minimal left ICA origin atherosclerosis. Dominant left vertebral  artery. Electronically Signed   By: Genevie Ann M.D.   On: 12/04/2015 16:01    Procedures Procedures (including critical care time)  Medications Ordered in ED Medications  aspirin chewable tablet 324 mg (324 mg Oral Not Given 12/04/15 1300)  labetalol (NORMODYNE,TRANDATE) injection 5 mg (5 mg Intravenous Not Given 12/04/15 1622)  morphine 4 MG/ML injection 4 mg (4 mg Intravenous Given 12/04/15 1315)  ondansetron (ZOFRAN) injection 4 mg (4 mg Intravenous Given 12/04/15 1315)  labetalol (NORMODYNE,TRANDATE) injection 5 mg (5 mg Intravenous Given 12/04/15 1346)  gadobenate dimeglumine (MULTIHANCE) injection 20 mL (20 mLs Intravenous Contrast Given 12/04/15 1525)  morphine 4 MG/ML injection 4 mg (4 mg Intravenous Given 12/04/15 1622)  predniSONE (DELTASONE) tablet 60 mg (60 mg Oral Given 12/04/15 1740)  acyclovir (ZOVIRAX) 200 MG capsule 400 mg (400 mg Oral Given 12/04/15 1739)     Initial Impression / Assessment and Plan / ED Course  I have reviewed the triage vital signs and the nursing notes.  Pertinent labs & imaging results that were available during my care of the patient were reviewed by me and considered in my medical decision making (see chart for details).  Clinical Course    BP 131/85   Pulse 68   Temp 98 F (36.7 C) (Oral)   Resp 14   Ht 6\' 2"  (1.88 m)   Wt 117.9 kg   SpO2 93%   BMI 33.38 kg/m    Final Clinical Impressions(s) / ED Diagnoses   Final diagnoses:  Bell's palsy  Hypertensive urgency    New Prescriptions New Prescriptions   ACYCLOVIR (ZOVIRAX) 400 MG TABLET    Take 1 tablet (400 mg total) by mouth 4 (four) times daily.   PREDNISONE (DELTASONE) 20  MG TABLET    3 Tabs PO Days 1-3, then 2 tabs PO Days 4-6, then 1 tab PO Day 7-9, then Half Tab PO Day 10-12   12:52 PM Patient here with left-sided weakness, left facial droop, and decreased sensation left side concerning for stroke. Last normal was last night so he is outside the window. Initial head CT scan  without acute finding. Will follow-up with brain and neck MRA. Aspirin given. Care discussed with Dr. Sherry Ruffing  1:26 PM Pt failed stroke swallow screen.  Having trouble breathing and slight tremors in arms.  Doubt seizure.  Portable CXR ordered.  Appreciate consultation from neurology Dr. Londell Moh who agrees to see and evaluate pt in the ER.    Elevated BP in the [id number] Systolic, labetalol 5mg  IV given.  Having trouble breathing, CXR ordered.  Pain medication and antinausea medication given.    1:57 PM Dr. Levell July have evaluated pt.  Felt it may have Bell's Palsy.  Suspect L sided weakness may be 2/2 hypertensive Urgency.  If MRI negative, will discharge with acyclovir and prednisone.    5:45 PM MRI/MRA from brain, neck, and orbits without evidence of a stroke. Questionable labral changes to the left seventh nerve raising possibility for neuritis such as Bell's palsy.  I discussed this with patient and he felt reassured. Shortly after, patient felt better, able to ambulate without difficulty, facial droop seems to be improving. And he is able to swallow without difficulty. Blood pressure has also improved. I question if patient may have a complicated migraine. He will be discharged with the psychotherapy and prednisone. Strict return precaution discussed. Patient also ambulate while maintaining oxygen duration of 98% on room air.  I encouraged patient to use artificial tear eyedrops on his left eye to prevent dryness of the eye. He will also need to follow-up closely with his primary care provider for further care.   Domenic Moras, PA-C 12/04/15 [place], MD 12/04/15 2002

## 2015-12-04 NOTE — Consult Note (Signed)
Requesting Physician: ED MD    Chief Complaint: Stroke and HTN urgency  History obtained from:  Patient     HPI:                                                                                                                                         [name] is an 50 y.o. male with hypertension, hyperlipidemia, and diabetes. Patient was last feeling normal approximately 8:00 last night. Patient went to sleep at that time. Patient awoke at 2:00 in the morning with significant headache. This is also associated with nausea and vomiting. Later that morning he was also noted to have difficulty closing his left eye in addition to having left arm and leg weakness and facial weakness. Patient was brought to the emergency department. CT head was obtained and showed no acute abnormalities. Patient is speaking in a hypophonic voice but is able to communicate. Apparently he did fail his swallow screen. While patient was talking and was noted that he could not wrinkle his left 4 head or close his left eye. Apparently had recently had a ocular surgery in his left eye a few days ago. Neurology was asked to consult for possibility of stroke.  Date last known well: Date: 12/03/2015 Time last known well: Time: 20:00 tPA Given: No: Out of the window  Past Medical History:  Diagnosis Date  . Depression   . Diabetes mellitus   . High cholesterol   . Hypertension     Past Surgical History:  Procedure Laterality Date  . CARDIAC CATHETERIZATION  2010   Dr Terrence Dupont, D1 20-25%, CFX 20-25%, RCA 10%, EF 50-55%  . CARDIAC CATHETERIZATION N/A 06/27/2015   Procedure: Left Heart Cath and Coronary Angiography;  Surgeon: Sherren Mocha, MD;  Location: [facility];  Service: Cardiovascular;  Laterality: N/A;  . CARDIAC CATHETERIZATION N/A 06/27/2015   Procedure: Intravascular Pressure Wire/FFR Study;  Surgeon: Sherren Mocha, MD;  Location: [facility];  Service: Cardiovascular;  Laterality: N/A;  . CORONARY  ANGIOPLASTY WITH STENT PLACEMENT  2008   per pt, at [facility], not seen on 2010 cath  . KNEE SURGERY     right knee    Family History  Problem Relation Age of Onset  . Hypertension Mother   . Heart failure Father   . Heart attack Father    Social History:  reports that he has never smoked. He does not have any smokeless tobacco history on file. He reports that he does not drink alcohol or use drugs.  Allergies: No Known Allergies  Medications:  Current Facility-Administered Medications  Medication Dose Route Frequency Provider Last Rate Last Dose  . aspirin chewable tablet 324 mg  324 mg Oral Once Domenic Moras, PA-C      . labetalol (NORMODYNE,TRANDATE) injection 5 mg  5 mg Intravenous Once Domenic Moras, PA-C       Current Outpatient Prescriptions  Medication Sig Dispense Refill  . aspirin EC 325 MG EC tablet Take 1 tablet (325 mg total) by mouth daily. 30 tablet 0  . atorvastatin (LIPITOR) 80 MG tablet Take 1 tablet (80 mg total) by mouth daily at 6 PM. 30 tablet 2  . benzonatate (TESSALON) 100 MG capsule Take 1 capsule (100 mg total) by mouth every 8 (eight) hours. (Patient not taking: Reported on 06/25/2015) 21 capsule 0  . citalopram (CELEXA) 20 MG tablet Take 20 mg by mouth daily.    Marland Kitchen FARXIGA 5 MG TABS tablet Take 5 mg by mouth daily.    Marland Kitchen glimepiride (AMARYL) 4 MG tablet Take 4 mg by mouth 2 (two) times daily.    Marland Kitchen lisinopril (PRINIVIL,ZESTRIL) 40 MG tablet Take 40 mg by mouth daily.     . metFORMIN (GLUCOPHAGE) 850 MG tablet Take 1 tablet (850 mg total) by mouth 3 (three) times daily. 15 tablet 0  . metoprolol tartrate (LOPRESSOR) 25 MG tablet Take 1 tablet (25 mg total) by mouth 2 (two) times daily. 60 tablet 0  . ondansetron (ZOFRAN ODT) 4 MG disintegrating tablet 4mg  ODT q4 hours prn nausea/vomit (Patient not taking: Reported on 06/25/2015) 20 tablet 0      ROS:                                                                                                                                       History obtained from the patient  General ROS: negative for - chills, fatigue, fever, night sweats, weight gain or weight loss Psychological ROS: negative for - behavioral disorder, hallucinations, memory difficulties, mood swings or suicidal ideation Ophthalmic ROS: negative for - blurry vision, double vision, eye pain or loss of vision ENT ROS: negative for - epistaxis, nasal discharge, oral lesions, sore throat, tinnitus or vertigo Allergy and Immunology ROS: negative for - hives or itchy/watery eyes Hematological and Lymphatic ROS: negative for - bleeding problems, bruising or swollen lymph nodes Endocrine ROS: negative for - galactorrhea, hair pattern changes, polydipsia/polyuria or temperature intolerance Respiratory ROS: negative for - cough, hemoptysis, shortness of breath or wheezing Cardiovascular ROS: negative for - chest pain, dyspnea on exertion, edema or irregular heartbeat Gastrointestinal ROS: negative for - abdominal pain, diarrhea, hematemesis, nausea/vomiting or stool incontinence Genito-Urinary ROS: negative for - dysuria, hematuria, incontinence or urinary frequency/urgency Musculoskeletal ROS: negative for - joint swelling or muscular weakness Neurological ROS: as noted in HPI Dermatological ROS: negative for rash and skin lesion changes  Neurologic Examination:  Blood pressure (!) 148/110, pulse 82, temperature 98 F (36.7 C), temperature source Oral, resp. rate 24, height 6\' 2"  (1.88 m), weight 117.9 kg (260 lb), SpO2 97 %.  HEENT-  Normocephalic, no lesions, without obvious abnormality.  Normal external eye and conjunctiva.  Normal TM's bilaterally.  Normal auditory canals and external ears. Normal external nose, mucus  membranes and septum.  Normal pharynx. Cardiovascular- S1, S2 normal, pulses palpable throughout   Lungs- chest clear, no wheezing, rales, normal symmetric air entry Abdomen- normal findings: aorta normal Extremities- no edema Lymph-no adenopathy palpable Musculoskeletal-no joint tenderness, deformity or swelling Skin-warm and dry, no hyperpigmentation, vitiligo, or suspicious lesions  Neurological Examination Mental Status: Alert, oriented, thought content appropriate.  Speech fluent  but hypophonic without evidence of aphasia. Patient appeared to be having some difficulty with breathing he was on a 2 L nasal cannula however when this was taken off he was still satting at 100%. Able to follow 3 step commands without difficulty. Cranial Nerves: II: Visual fields grossly normal, pupils unequal with his left pupil larger than his right, round, reactive to light and accommodation III,IV, VI: ptosis not present  however he is unable to fully close his left eyelid., extra-ocular motions intact bilaterally V,VII: smile asymmetric on the left including his for head and lower face, facial light touch sensation normal bilaterally VIII: hearing normal bilaterally IX,X: uvula rises symmetrically XI: bilateral shoulder shrug--- patient had full strength when turning his head to the right but decreased strength when turning his head to the left XII: midline tongue extension Motor: Right : Upper extremity   5/5    Left:     Upper extremity   4/5  Lower extremity   5/5     Lower extremity   4/5 Tone and bulk:normal tone throughout; no atrophy noted Sensory: Decreased sensation on his left arm and left leg Deep Tendon Reflexes: 2+ and symmetric throughout Plantars: Right: downgoing   Left: downgoing Cerebellar: normal finger-to-nose,  and normal heel-to-shin test Gait: Not tested       Lab Results: Basic Metabolic Panel:  Recent Labs Lab 12/04/15 1201  NA 139  K 4.4  CL 109  CO2 21*   GLUCOSE 118*  BUN 14  CREATININE 1.01  CALCIUM 10.0    Liver Function Tests:  Recent Labs Lab 12/04/15 1201  AST 20  ALT 23  ALKPHOS 55  BILITOT 0.5  PROT 7.8  ALBUMIN 4.2   No results for input(s): LIPASE, AMYLASE in the last 168 hours. No results for input(s): AMMONIA in the last 168 hours.  CBC:  Recent Labs Lab 12/04/15 1201  WBC 8.1  NEUTROABS 4.6  HGB 18.3*  HCT 50.9  MCV 86.4  PLT 203    Cardiac Enzymes: No results for input(s): CKTOTAL, CKMB, CKMBINDEX, TROPONINI in the last 168 hours.  Lipid Panel: No results for input(s): CHOL, TRIG, HDL, CHOLHDL, VLDL, LDLCALC in the last 168 hours.  CBG:  Recent Labs Lab 12/04/15 1157 12/04/15 1238  GLUCAP 121* 100*    Microbiology: Results for orders placed or performed in visit on 02/04/12  Wound culture     Status: None   Collection Time: 02/04/12  5:27 PM  Result Value Ref Range Status   Culture Moderate STAPHYLOCOCCUS AUREUS  Final   Gram Stain No WBC Seen  Final   Gram Stain No Squamous Epithelial Cells Seen  Final   Gram Stain Few GRAM POSITIVE COCCI IN CLUSTERS  Final   Organism ID, Bacteria  STAPHYLOCOCCUS AUREUS  Final    Comment: Rifampin and Gentamicin should not be used as single drugs for treatment of Staph infections.      Susceptibility   Staphylococcus aureus -  (no method available)    PENICILLIN 0.12 Sensitive     OXACILLIN 0.5 Sensitive     CEFAZOLIN  Sensitive     GENTAMICIN <=0.5 Sensitive     CIPROFLOXACIN <=0.5 Sensitive     LEVOFLOXACIN 0.25 Sensitive     MOXIFLOXACIN <=0.25 Sensitive     TRIMETH/SULFA <=10 Sensitive     VANCOMYCIN 1 Sensitive     CLINDAMYCIN <=0.25 Sensitive     ERYTHROMYCIN <=0.25 Sensitive     LINEZOLID 2 Sensitive     QUINUPRISTIN/DALFOPRISTIN <=0.25 Sensitive     RIFAMPIN <=0.5 Sensitive     TETRACYCLINE <=1 Sensitive     Coagulation Studies:  Recent Labs  12/04/15 1201  LABPROT 13.0  INR 0.98    Imaging: Ct Head Wo Contrast  Result  Date: 12/04/2015 CLINICAL DATA:  Left-sided headache with left-sided facial numbness. Unsteady gait EXAM: CT HEAD WITHOUT CONTRAST TECHNIQUE: Contiguous axial images were obtained from the base of the skull through the vertex without intravenous contrast. COMPARISON:  October 23, 2003 FINDINGS: Brain: The ventricles are normal in size and configuration. There is no intracranial mass, hemorrhage, extra-axial fluid collection, or midline shift. Gray-white compartments are normal. No acute infarct evident. Vascular: There is no hyperdense vessel. There is no evident vascular calcification. Skull: The bony calvarium appears intact. Sinuses/Orbits: Visualized paranasal sinuses are clear. Orbits appear symmetric bilaterally. Patient appears to have had surgical removal of the lens on the left. Other: There is chronic opacification in the inferior right mastoid region. Mastoids elsewhere clear in stable. IMPRESSION: No intracranial mass, hemorrhage, or extra-axial fluid collection. Gray-white compartments appear normal. Patient appears to have had a previous removal of the lens of the left eye. 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 Other mastoids appear clear. Electronically Signed   By: Lowella Grip III M.D.   On: 12/04/2015 12:39       Assessment and plan discussed with with attending physician and they are in agreement.    Etta Quill PA-C [facility] [phone]  12/04/2015, 2:01 PM   Assessment: 50 y.o. male with new onset left facial asymmetry, inability to close his left eye, weakness of his left arm and leg along with decreased sensation. CT of brain showed no intracranial abnormalities however patient's blood pressure is significantly elevated with systolic to A999333 and diastolic [id number]. Much of patient's weakness could easily be attributed to his hypertensive urgency. There is also concern that patient may have a Bell's palsy versus CVA given his elevated blood  pressure.  Recommend: 1) blood pressure control with goal blood pressure systolic less than A999333 and diastolic less than A999333. If no CVA is noted on MRI and would be more aggressive. 2) MRI of brain, MRA of the neck with contrast, MRA of head. If positive for stroke would further stroke workup 3) if MRI of brain shows no acute infarct would treat patient for Bell's palsy as follows: --60 mg Prednisone daily for one Watts along with Valacyclovir 1 Gram TID for one Watts.    Stroke Risk Factors - diabetes mellitus, hyperlipidemia and hypertension

## 2015-12-04 NOTE — ED Triage Notes (Signed)
Pt reports blurred vision to left eye that started yesterday. Went to eye dr today and sent here to r/o stroke. Pt reports headache that started 0200. facial droop noted at triage and pt reports unable to eat/drink due to drooling.

## 2015-12-04 NOTE — ED Notes (Signed)
Patient failed swallow screen while swallowing sip of water, had an episode of coughing. Patient swallow screen failed. SpO2 fell to 90% while coughing. Patient placed on 2L Nasal Canula. SpO2 96%. EDPA notified and at bedside. Family present and updated.

## 2015-12-04 NOTE — ED Notes (Addendum)
Patient became very anxious and requested to be taken out of the MRI scanner.  MRI tech and Nurse removed patient from inside the scanner and discussed estimated length of time for remaining scans. After the discussion the patient consented to completing imaging.

## 2015-12-04 NOTE — ED Notes (Signed)
Neurology PA at Bedside

## 2015-12-04 NOTE — ED Notes (Signed)
Pt is in stable condition upon d/c and is escorted from ED via wheelchair. 

## 2015-12-04 NOTE — Discharge Instructions (Signed)
Please return promptly if your condition worsen or if you have any concerns.

## 2016-05-10 ENCOUNTER — Ambulatory Visit (INDEPENDENT_AMBULATORY_CARE_PROVIDER_SITE_OTHER): Payer: Managed Care, Other (non HMO) | Admitting: Physician Assistant

## 2016-05-10 VITALS — BP 158/88 | HR 72 | Temp 98.3°F | Resp 16 | Ht 72.5 in | Wt 263.2 lb

## 2016-05-10 DIAGNOSIS — L739 Follicular disorder, unspecified: Secondary | ICD-10-CM

## 2016-05-10 DIAGNOSIS — L0390 Cellulitis, unspecified: Secondary | ICD-10-CM

## 2016-05-10 LAB — POCT CBC
Granulocyte percent: 45 %G (ref 37–80)
HCT, POC: 43.9 % (ref 43.5–53.7)
Hemoglobin: 15 g/dL (ref 14.1–18.1)
Lymph, poc: 4.1 — AB (ref 0.6–3.4)
MCH: 30.5 pg (ref 27–31.2)
MCHC: 34.3 g/dL (ref 31.8–35.4)
MCV: 89 fL (ref 80–97)
MID (CBC): 0.2 (ref 0–0.9)
MPV: 7.4 fL (ref 0–99.8)
POC Granulocyte: 3.5 (ref 2–6.9)
POC LYMPH %: 52.7 % — AB (ref 10–50)
POC MID %: 2.3 % (ref 0–12)
Platelet Count, POC: 228 10*3/uL (ref 142–424)
RBC: 4.93 M/uL (ref 4.69–6.13)
RDW, POC: 14.2 %
WBC: 7.7 10*3/uL (ref 4.6–10.2)

## 2016-05-10 MED ORDER — CEPHALEXIN 500 MG PO CAPS: 500.0000 mg | ORAL_CAPSULE | Freq: Three times a day (TID) | ORAL | 0 refills | Status: AC

## 2016-05-10 MED ORDER — KETOROLAC TROMETHAMINE 60 MG/2ML IM SOLN
60.0000 mg | Freq: Once | INTRAMUSCULAR | Status: AC
  Administered 2016-05-10: 60 mg via INTRAMUSCULAR

## 2016-05-10 NOTE — Patient Instructions (Addendum)
Take antibiotics as prescribed. Use warm compress to the affected area 4-5 x a day for 20-30 minutes at a time. Try to keep the area covered with a clean bandage. Avoid shaving this area until this has fully healed. Just to be aware, this can occur again each time you shave. If your pain does not resolve in 7 days or it starts to worsen or you develop fever, chills, and sweating seek care sooner. Thank you for letting me participate in your health and well being.   Folliculitis Folliculitis is inflammation of the hair follicles. Folliculitis most commonly occurs on the scalp, thighs, legs, back, and buttocks. However, it can occur anywhere on the body. What are the causes? This condition may be caused by:  A bacterial infection (common).  A fungal infection.  A viral infection.  Coming into contact with certain chemicals, especially oils and tars.  Shaving or waxing.  Applying greasy ointments or creams to your skin often. Long-lasting folliculitis and folliculitis that keeps coming back can be caused by bacteria that live in the nostrils. What increases the risk? This condition is more likely to develop in people with:  A weakened immune system.  Diabetes.  Obesity. What are the signs or symptoms? Symptoms of this condition include:  Redness.  Soreness.  Swelling.  Itching.  Small white or yellow, pus-filled, itchy spots (pustules) that appear over a reddened area. If there is an infection that goes deep into the follicle, these may develop into a boil (furuncle).  A group of closely packed boils (carbuncle). These tend to form in hairy, sweaty areas of the body. How is this diagnosed? This condition is diagnosed with a skin exam. To find what is causing the condition, your health care provider may take a sample of one of the pustules or boils for testing. How is this treated? This condition may be treated by:  Applying warm compresses to the affected areas.  Taking  an antibiotic medicine or applying an antibiotic medicine to the skin.  Applying or bathing with an antiseptic solution.  Taking an over-the-counter medicine to help with itching.  Having a procedure to drain any pustules or boils. This may be done if a pustule or boil contains a lot of pus or fluid.  Laser hair removal. This may be done to treat long-lasting folliculitis. Follow these instructions at home:  If directed, apply heat to the affected area as often as told by your health care provider. Use the heat source that your health care provider recommends, such as a moist heat pack or a heating pad.  Place a towel between your skin and the heat source.  Leave the heat on for 20-30 minutes.  Remove the heat if your skin turns bright red. This is especially important if you are unable to feel pain, heat, or cold. You may have a greater risk of getting burned.  If you were prescribed an antibiotic medicine, use it as told by your health care provider. Do not stop using the antibiotic even if you start to feel better.  Take over-the-counter and prescription medicines only as told by your health care provider.  Do not shave irritated skin.  Keep all follow-up visits as told by your health care provider. This is important. Get help right away if:  You have more redness, swelling, or pain in the affected area.  Red streaks are spreading from the affected area.  You have a fever. This information is not intended to replace advice  given to you by your health care provider. Make sure you discuss any questions you have with your health care provider. Document Released: 03/29/2001 Document Revised: 08/08/2015 Document Reviewed: 11/08/2014 Elsevier Interactive Patient Education  2017 [employer].    IF you received an x-ray today, you will receive an invoice from [facility]. Please contact [facility] at [phone] with questions or concerns regarding your  invoice.   IF you received labwork today, you will receive an invoice from [facility]. Please contact [facility] at [phone] with questions or concerns regarding your invoice.   Our billing staff will not be able to assist you with questions regarding bills from these companies.  You will be contacted with the lab results as soon as they are available. The fastest way to get your results is to activate your My Chart account. Instructions are located on the last page of this paperwork. If you have not heard from [place] regarding the results in 2 weeks, please contact this office.

## 2016-05-10 NOTE — Progress Notes (Signed)
[name]  MRN: [id number] DOB: [birth year]  Subjective:  [name] is a 51 y.o. male seen in office today for a chief complaint of knot on the back of neck x 1 day. He just shaved his head yesterday and noticed this pain shortly after. Has pain that extends into his neck, redness, warmth, and headache. Denies purulent drainage, fever, and chills. Has tried hot rag with no relief. Notes he has had these in the past because he shows his head here but has not had one since 2014.   Review of Systems  Constitutional: Negative for diaphoresis.  Eyes: Negative for visual disturbance.  Gastrointestinal: Negative for nausea and vomiting.  Neurological: Negative for dizziness and light-headedness.    Patient Active Problem List   Diagnosis Date Noted  . Chest pain 06/25/2015  . Diabetes mellitus 06/25/2015  . High cholesterol 06/25/2015  . Hypertension 06/25/2015  . Pain in the chest   . Type 2 diabetes mellitus without complication, without long-term current use of insulin ([facility])     Current Outpatient Prescriptions on File Prior to Visit  Medication Sig Dispense Refill  . acyclovir (ZOVIRAX) 400 MG tablet Take 1 tablet (400 mg total) by mouth 4 (four) times daily. 50 tablet 0  . aspirin EC 325 MG EC tablet Take 1 tablet (325 mg total) by mouth daily. 30 tablet 0  . atorvastatin (LIPITOR) 80 MG tablet Take 1 tablet (80 mg total) by mouth daily at 6 PM. 30 tablet 2  . FARXIGA 10 MG TABS tablet Take 10 mg by mouth daily.    Marland Kitchen glimepiride (AMARYL) 4 MG tablet Take 4 mg by mouth 2 (two) times daily.    Marland Kitchen lisinopril (PRINIVIL,ZESTRIL) 40 MG tablet Take 40 mg by mouth daily.     . metFORMIN (GLUCOPHAGE) 850 MG tablet Take 1 tablet (850 mg total) by mouth 3 (three) times daily. 15 tablet 0  . metoprolol tartrate (LOPRESSOR) 25 MG tablet Take 1 tablet (25 mg total) by mouth 2 (two) times daily. 60 tablet 0  . trimethoprim-polymyxin b (POLYTRIM) ophthalmic solution Place 1 drop into the left eye 3  (three) times daily.    . citalopram (CELEXA) 20 MG tablet Take 20 mg by mouth daily.    . ondansetron (ZOFRAN ODT) 4 MG disintegrating tablet 4mg  ODT q4 hours prn nausea/vomit (Patient not taking: Reported on 12/04/2015) 20 tablet 0  . prednisoLONE acetate (PRED FORTE) 1 % ophthalmic suspension Place 1 drop into the left eye 3 (three) times daily.    Marland Kitchen PROLENSA 0.07 % SOLN Place 1 drop into the left eye 3 (three) times daily.     No current facility-administered medications on file prior to visit.     No Known Allergies     Social History   Social History  . Marital status: Divorced    Spouse name: N/A  . Number of children: N/A  . Years of education: N/A   Occupational History  . Materials management    Social History Main Topics  . Smoking status: Never Smoker  . Smokeless tobacco: Never Used  . Alcohol use No  . Drug use: No  . Sexual activity: Yes    Birth control/ protection: None   Other Topics Concern  . Not on file   Social History Narrative   Lives in [place] with girlfriend.    Objective:  BP (!) 158/88   Pulse 72   Temp 98.3 F (36.8 C) (Oral)   Resp 16  Ht 6' 0.5" (1.842 m)   Wt 263 lb 3.2 oz (119.4 kg)   SpO2 98%   BMI 35.21 kg/m   Physical Exam  Constitutional: He is oriented to person, place, and time.  Well developed, well nourished, appears uncomfortable sitting on exam table.   HENT:  Head: Normocephalic and atraumatic.  Eyes: Conjunctivae are normal.  Neck: Normal range of motion. No neck rigidity. Normal range of motion present.    Pulmonary/Chest: Effort normal.  Lymphadenopathy:       Head (right side): No submental, no submandibular, no tonsillar, no preauricular, no posterior auricular and no occipital adenopathy present.       Head (left side): No submental, no submandibular, no tonsillar, no preauricular, no posterior auricular and no occipital adenopathy present.    He has no cervical adenopathy.       Right: No supraclavicular  adenopathy present.       Left: No supraclavicular adenopathy present.  Neurological: He is alert and oriented to person, place, and time. Gait normal.  Skin: Skin is warm and dry.  Psychiatric: Affect normal.  Vitals reviewed.  BP Readings from Last 3 Encounters:  05/10/16 (!) 158/88  12/04/15 131/85  06/28/15 (!) 110/54   Results for orders placed or performed in visit on 05/10/16 (from the past 24 hour(s))  POCT CBC     Status: Abnormal   Collection Time: 05/10/16  6:16 PM  Result Value Ref Range   WBC 7.7 4.6 - 10.2 K/uL   Lymph, poc 4.1 (A) 0.6 - 3.4   POC LYMPH PERCENT 52.7 (A) 10 - 50 %L   MID (cbc) 0.2 0 - 0.9   POC MID % 2.3 0 - 12 %M   POC Granulocyte 3.5 2 - 6.9   Granulocyte percent 45.0 37 - 80 %G   RBC 4.93 4.69 - 6.13 M/uL   Hemoglobin 15.0 14.1 - 18.1 g/dL   HCT, POC 43.9 43.5 - 53.7 %   MCV 89.0 80 - 97 fL   MCH, POC 30.5 27 - 31.2 pg   MCHC 34.3 31.8 - 35.4 g/dL   RDW, POC 14.2 %   Platelet Count, POC 228 142 - 424 K/uL   MPV 7.4 0 - 99.8 fL   Procedure: Area cleansed with alcohol pad. 18G needle use to superficially deroof pustule. Minimal purulent discharge expressed. Wound culture obtained. Affected area cleansed and dressed. Pt tolerated well.  Assessment and Plan :  1. Folliculitis Labs pending. Instructed patient to avoid shaving while this is healing. Also informed him that shaving increases his risk for recurring infection.  - WOUND CULTURE - POCT CBC 2. Cellulitis, unspecified cellulitis site Instructed to return to clinic if symptoms worsen, do not improve in 5-7 days, or as needed - ketorolac (TORADOL) injection 60 mg; Inject 2 mLs (60 mg total) into the muscle once. - cephALEXin (KEFLEX) 500 MG capsule; Take 1 capsule (500 mg total) by mouth 3 (three) times daily.  Dispense: 21 capsule; Refill: 0  Tenna Delaine PA-C  [facility] 05/10/2016 7:13 PM

## 2016-05-12 ENCOUNTER — Other Ambulatory Visit: Payer: Self-pay | Admitting: Physician Assistant

## 2016-05-12 LAB — WOUND CULTURE: ORGANISM ID, BACTERIA: NONE SEEN

## 2016-05-12 MED ORDER — DOXYCYCLINE HYCLATE 100 MG PO CAPS: 100.0000 mg | ORAL_CAPSULE | Freq: Two times a day (BID) | ORAL | 0 refills | Status: DC

## 2016-05-12 NOTE — Progress Notes (Signed)
Meds ordered this encounter  Medications  . doxycycline (VIBRAMYCIN) 100 MG capsule    Sig: Take 1 capsule (100 mg total) by mouth 2 (two) times daily.    Dispense:  20 capsule    Refill:  0    Order Specific Question:   Supervising Provider    Answer:   SMITH, KRISTI M [[id number]]     

## 2016-08-03 ENCOUNTER — Observation Stay (HOSPITAL_COMMUNITY)
Admission: AD | Admit: 2016-08-03 | Discharge: 2016-08-05 | Disposition: A | Discharge status: Home / Self Care | Payer: Managed Care, Other (non HMO) | Source: Other Acute Inpatient Hospital | Attending: Internal Medicine | Admitting: Internal Medicine

## 2016-08-03 DIAGNOSIS — R42 Dizziness and giddiness: Secondary | ICD-10-CM | POA: Diagnosis not present

## 2016-08-03 DIAGNOSIS — E119 Type 2 diabetes mellitus without complications: Secondary | ICD-10-CM | POA: Insufficient documentation

## 2016-08-03 DIAGNOSIS — F329 Major depressive disorder, single episode, unspecified: Secondary | ICD-10-CM | POA: Insufficient documentation

## 2016-08-03 DIAGNOSIS — M25561 Pain in right knee: Secondary | ICD-10-CM | POA: Diagnosis not present

## 2016-08-03 DIAGNOSIS — I2510 Atherosclerotic heart disease of native coronary artery without angina pectoris: Secondary | ICD-10-CM | POA: Insufficient documentation

## 2016-08-03 DIAGNOSIS — R52 Pain, unspecified: Secondary | ICD-10-CM

## 2016-08-03 DIAGNOSIS — R55 Syncope and collapse: Secondary | ICD-10-CM | POA: Diagnosis not present

## 2016-08-03 DIAGNOSIS — Z955 Presence of coronary angioplasty implant and graft: Secondary | ICD-10-CM | POA: Diagnosis not present

## 2016-08-03 DIAGNOSIS — I10 Essential (primary) hypertension: Secondary | ICD-10-CM | POA: Diagnosis present

## 2016-08-03 DIAGNOSIS — Z79899 Other long term (current) drug therapy: Secondary | ICD-10-CM | POA: Insufficient documentation

## 2016-08-03 DIAGNOSIS — Z7984 Long term (current) use of oral hypoglycemic drugs: Secondary | ICD-10-CM | POA: Insufficient documentation

## 2016-08-03 DIAGNOSIS — Z7982 Long term (current) use of aspirin: Secondary | ICD-10-CM | POA: Insufficient documentation

## 2016-08-03 DIAGNOSIS — R51 Headache: Secondary | ICD-10-CM | POA: Diagnosis present

## 2016-08-03 DIAGNOSIS — E7800 Pure hypercholesterolemia, unspecified: Secondary | ICD-10-CM | POA: Diagnosis not present

## 2016-08-03 LAB — GLUCOSE, CAPILLARY: Glucose-Capillary: 203 mg/dL — ABNORMAL HIGH (ref 65–99)

## 2016-08-03 MED ORDER — INSULIN ASPART 100 UNIT/ML ~~LOC~~ SOLN: 0.0000 [IU] | Freq: Three times a day (TID) | SUBCUTANEOUS | Status: DC

## 2016-08-03 MED ORDER — INSULIN ASPART 100 UNIT/ML ~~LOC~~ SOLN
0.0000 [IU] | Freq: Every day | SUBCUTANEOUS | Status: DC
  Administered 2016-08-04: 2 [IU] via SUBCUTANEOUS

## 2016-08-03 MED ORDER — ACETAMINOPHEN 325 MG PO TABS
650.0000 mg | ORAL_TABLET | Freq: Four times a day (QID) | ORAL | Status: DC | PRN
  Administered 2016-08-04: 650 mg via ORAL
  Filled 2016-08-03: qty 2

## 2016-08-03 MED ORDER — TRAMADOL HCL 50 MG PO TABS
50.0000 mg | ORAL_TABLET | Freq: Four times a day (QID) | ORAL | Status: AC | PRN
  Administered 2016-08-04 (×2): 50 mg via ORAL
  Filled 2016-08-03 (×2): qty 1

## 2016-08-03 MED ORDER — ACETAMINOPHEN 650 MG RE SUPP
650.0000 mg | Freq: Four times a day (QID) | RECTAL | Status: DC | PRN
Start: 2016-08-03 — End: 2016-08-05

## 2016-08-04 ENCOUNTER — Encounter (HOSPITAL_COMMUNITY): Payer: Self-pay | Admitting: *Deleted

## 2016-08-04 ENCOUNTER — Observation Stay (HOSPITAL_BASED_OUTPATIENT_CLINIC_OR_DEPARTMENT_OTHER): Payer: Managed Care, Other (non HMO)

## 2016-08-04 ENCOUNTER — Observation Stay (HOSPITAL_COMMUNITY): Payer: Managed Care, Other (non HMO)

## 2016-08-04 DIAGNOSIS — R42 Dizziness and giddiness: Principal | ICD-10-CM

## 2016-08-04 DIAGNOSIS — R55 Syncope and collapse: Secondary | ICD-10-CM

## 2016-08-04 DIAGNOSIS — E119 Type 2 diabetes mellitus without complications: Secondary | ICD-10-CM

## 2016-08-04 DIAGNOSIS — I2510 Atherosclerotic heart disease of native coronary artery without angina pectoris: Secondary | ICD-10-CM | POA: Diagnosis present

## 2016-08-04 LAB — ECHOCARDIOGRAM COMPLETE
HEIGHTINCHES: 74 in
WEIGHTICAEL: 4063.52 [oz_av]

## 2016-08-04 LAB — VAS US CAROTID
LEFT ECA DIAS: -9 cm/s
LEFT VERTEBRAL DIAS: -19 cm/s
LICAPDIAS: -17 cm/s
Left CCA dist dias: -18 cm/s
Left CCA dist sys: -91 cm/s
Left CCA prox dias: 23 cm/s
Left CCA prox sys: 138 cm/s
Left ICA dist dias: -27 cm/s
Left ICA dist sys: -71 cm/s
Left ICA prox sys: -102 cm/s
RCCADSYS: -78 cm/s
RCCAPDIAS: 19 cm/s
RIGHT ECA DIAS: -17 cm/s
RIGHT VERTEBRAL DIAS: -10 cm/s
Right CCA prox sys: 125 cm/s

## 2016-08-04 LAB — GLUCOSE, CAPILLARY
GLUCOSE-CAPILLARY: 119 mg/dL — AB (ref 65–99)
GLUCOSE-CAPILLARY: 50 mg/dL — AB (ref 65–99)
GLUCOSE-CAPILLARY: 87 mg/dL (ref 65–99)
Glucose-Capillary: 108 mg/dL — ABNORMAL HIGH (ref 65–99)
Glucose-Capillary: 121 mg/dL — ABNORMAL HIGH (ref 65–99)

## 2016-08-04 LAB — COMPREHENSIVE METABOLIC PANEL
ALK PHOS: 50 U/L (ref 38–126)
ALT: 18 U/L (ref 17–63)
ANION GAP: 7 (ref 5–15)
AST: 17 U/L (ref 15–41)
Albumin: 3.3 g/dL — ABNORMAL LOW (ref 3.5–5.0)
BILIRUBIN TOTAL: 0.6 mg/dL (ref 0.3–1.2)
BUN: 15 mg/dL (ref 6–20)
CALCIUM: 9 mg/dL (ref 8.9–10.3)
CO2: 22 mmol/L (ref 22–32)
CREATININE: 1.02 mg/dL (ref 0.61–1.24)
Chloride: 107 mmol/L (ref 101–111)
GFR calc non Af Amer: >60 mL/min (ref >60)
Glucose, Bld: 146 mg/dL — ABNORMAL HIGH (ref 65–99)
Potassium: 4.6 mmol/L (ref 3.5–5.1)
SODIUM: 136 mmol/L (ref 135–145)
TOTAL PROTEIN: 6.4 g/dL — AB (ref 6.5–8.1)

## 2016-08-04 LAB — HIV ANTIBODY (ROUTINE TESTING W REFLEX): HIV Screen 4th Generation wRfx: NONREACTIVE

## 2016-08-04 LAB — CBC
HCT: 41.6 % (ref 39.0–52.0)
HEMOGLOBIN: 14.2 g/dL (ref 13.0–17.0)
MCH: 29.7 pg (ref 26.0–34.0)
MCHC: 34.1 g/dL (ref 30.0–36.0)
MCV: 87 fL (ref 78.0–100.0)
PLATELETS: 222 10*3/uL (ref 150–400)
RBC: 4.78 MIL/uL (ref 4.22–5.81)
RDW: 13.8 % (ref 11.5–15.5)
WBC: 10.6 10*3/uL — ABNORMAL HIGH (ref 4.0–10.5)

## 2016-08-04 LAB — TROPONIN I
Troponin I: <0.03 ng/mL (ref <0.03)
Troponin I: <0.03 ng/mL (ref <0.03)

## 2016-08-04 LAB — TSH: TSH: 0.573 u[IU]/mL (ref 0.350–4.500)

## 2016-08-04 LAB — MRSA PCR SCREENING: MRSA BY PCR: NEGATIVE

## 2016-08-04 MED ORDER — METOPROLOL TARTRATE 25 MG PO TABS
25.0000 mg | ORAL_TABLET | Freq: Two times a day (BID) | ORAL | Status: DC
  Administered 2016-08-04 – 2016-08-05 (×3): 25 mg via ORAL
  Filled 2016-08-04 (×3): qty 1

## 2016-08-04 MED ORDER — GLIMEPIRIDE 4 MG PO TABS
4.0000 mg | ORAL_TABLET | Freq: Two times a day (BID) | ORAL | Status: DC
  Administered 2016-08-04 – 2016-08-05 (×3): 4 mg via ORAL
  Filled 2016-08-04 (×4): qty 1

## 2016-08-04 MED ORDER — PREDNISOLONE ACETATE 1 % OP SUSP
1.0000 [drp] | Freq: Three times a day (TID) | OPHTHALMIC | Status: DC
  Administered 2016-08-04 – 2016-08-05 (×4): 1 [drp] via OPHTHALMIC
  Filled 2016-08-04: qty 1

## 2016-08-04 MED ORDER — METFORMIN HCL 850 MG PO TABS
850.0000 mg | ORAL_TABLET | Freq: Three times a day (TID) | ORAL | Status: DC
  Administered 2016-08-04 – 2016-08-05 (×5): 850 mg via ORAL
  Filled 2016-08-04 (×6): qty 1

## 2016-08-04 MED ORDER — HYDRALAZINE HCL 20 MG/ML IJ SOLN: 10.0000 mg | Freq: Four times a day (QID) | INTRAMUSCULAR | Status: DC | PRN

## 2016-08-04 MED ORDER — KETOROLAC TROMETHAMINE 0.5 % OP SOLN
1.0000 [drp] | Freq: Four times a day (QID) | OPHTHALMIC | Status: DC
  Administered 2016-08-04 – 2016-08-05 (×6): 1 [drp] via OPHTHALMIC
  Filled 2016-08-04: qty 3

## 2016-08-04 MED ORDER — CANAGLIFLOZIN 100 MG PO TABS
100.0000 mg | ORAL_TABLET | Freq: Every day | ORAL | Status: DC
  Administered 2016-08-04 – 2016-08-05 (×2): 100 mg via ORAL
  Filled 2016-08-04 (×2): qty 1

## 2016-08-04 MED ORDER — TRAMADOL HCL 50 MG PO TABS
50.0000 mg | ORAL_TABLET | Freq: Four times a day (QID) | ORAL | Status: DC | PRN
Start: 2016-08-04 — End: 2016-08-05
  Administered 2016-08-04: 50 mg via ORAL
  Filled 2016-08-04: qty 1

## 2016-08-04 MED ORDER — CITALOPRAM HYDROBROMIDE 20 MG PO TABS
20.0000 mg | ORAL_TABLET | Freq: Every day | ORAL | Status: DC
  Administered 2016-08-04 – 2016-08-05 (×2): 20 mg via ORAL
  Filled 2016-08-04 (×2): qty 1

## 2016-08-04 MED ORDER — MORPHINE SULFATE (PF) 2 MG/ML IV SOLN
2.0000 mg | INTRAVENOUS | Status: DC | PRN
  Administered 2016-08-04 – 2016-08-05 (×2): 2 mg via INTRAVENOUS
  Filled 2016-08-04 (×2): qty 1

## 2016-08-04 MED ORDER — ASPIRIN EC 325 MG PO TBEC
325.0000 mg | DELAYED_RELEASE_TABLET | Freq: Every day | ORAL | Status: DC
  Administered 2016-08-04 – 2016-08-05 (×2): 325 mg via ORAL
  Filled 2016-08-04 (×2): qty 1

## 2016-08-04 MED ORDER — LISINOPRIL 40 MG PO TABS
40.0000 mg | ORAL_TABLET | Freq: Every day | ORAL | Status: DC
  Administered 2016-08-04 – 2016-08-05 (×2): 40 mg via ORAL
  Filled 2016-08-04: qty 2
  Filled 2016-08-04: qty 1

## 2016-08-04 MED ORDER — ATORVASTATIN CALCIUM 80 MG PO TABS
80.0000 mg | ORAL_TABLET | Freq: Every day | ORAL | Status: DC
  Administered 2016-08-04: 80 mg via ORAL
  Filled 2016-08-04: qty 1

## 2016-08-04 NOTE — Progress Notes (Signed)
  Echocardiogram 2D Echocardiogram has been performed.  Bobbye Charleston 08/04/2016, 4:12 PM

## 2016-08-04 NOTE — Progress Notes (Signed)
Patient transferring to [phone], report called to [facility]. Patient and family aware of transfer.  Katilin Raynes, Tivis Ringer, RN

## 2016-08-04 NOTE — H&P (Signed)
TRH H&P   Patient Demographics:    [name], is a 51 y.o. male  MRN: [id number]   DOB - [birth year]  Admit Date - 08/03/2016  Outpatient Primary MD for the patient is Anthony Nakai, MD  Referring MD/NP/PA:   Charlies Watts  Outpatient Specialists:   Patient coming from: [facility] ER  No chief complaint on file.     HPI:    [name]  is a 51 y.o. male, w CAD s/p CABG apparently c/o at around 53 oclock went down to his knees and then ambulance was called and taken to [facility].  He then developed a frontal headache, w photophobia.  + nausea but no emesis.    In ED CT brain negative, MRI brain negative. Pt had LP, results of which are negative.   I further discussed his symptoms and the patient is describing vertigo/dizziness.  Pt will be admitted for vertigo likely BPV.  Pt denies tinnitus, hearing loss, or family history of vertigo.     Review of systems:    In addition to the HPI above,    No Fever-chills, No changes with Vision or hearing, No problems swallowing food or Liquids, No Chest pain, Cough or Shortness of Breath, No Abdominal pain, No Nausea or Vommitting, Bowel movements are regular, No Blood in stool or Urine, No dysuria, No new skin rashes or bruises, No new joints pains-aches,  No new weakness, tingling, numbness in any extremity, No recent weight gain or loss, No polyuria, polydypsia or polyphagia, No significant Mental Stressors.  A full 10 point Review of Systems was done, except as stated above, all other Review of Systems were negative.   With Past History of the following :    Past Medical History:  Diagnosis Date  . Depression   . Diabetes mellitus   . High cholesterol   . Hypertension       Past Surgical History:  Procedure Laterality Date  . CARDIAC CATHETERIZATION  2010   Dr Terrence Dupont, D1 20-25%, CFX 20-25%, RCA 10%, EF  50-55%  . CARDIAC CATHETERIZATION N/A 06/27/2015   Procedure: Left Heart Cath and Coronary Angiography;  Surgeon: Sherren Mocha, MD;  Location: [facility];  Service: Cardiovascular;  Laterality: N/A;  . CARDIAC CATHETERIZATION N/A 06/27/2015   Procedure: Intravascular Pressure Wire/FFR Study;  Surgeon: Sherren Mocha, MD;  Location: [facility];  Service: Cardiovascular;  Laterality: N/A;  . CORONARY ANGIOPLASTY WITH STENT PLACEMENT  2008   per pt, at [place], not seen on 2010 cath  . EYE SURGERY    . KNEE SURGERY     right knee      Social History:     Social History  Substance Use Topics  . Smoking status: Never Smoker  . Smokeless tobacco: Never Used  . Alcohol use No     Lives - at home  Mobility - walks by self   Family History :     Family History  Problem Relation Age of Onset  . Hypertension Mother   . Heart failure Father   . Heart attack Father       Home Medications:   Prior to Admission medications   Medication Sig Start Date End Date Taking? Authorizing Provider  acyclovir (ZOVIRAX) 400 MG tablet Take 1 tablet (400 mg total) by mouth 4 (four) times daily. 12/04/15   Domenic Moras, PA-C  aspirin EC 325 MG EC tablet Take 1 tablet (325 mg total) by mouth daily. 06/28/15   Nita Sells, MD  atorvastatin (LIPITOR) 80 MG tablet Take 1 tablet (80 mg total) by mouth daily at 6 PM. 06/28/15   Nita Sells, MD  citalopram (CELEXA) 20 MG tablet Take 20 mg by mouth daily. 06/21/15   [provider]  doxycycline (VIBRAMYCIN) 100 MG capsule Take 1 capsule (100 mg total) by mouth 2 (two) times daily. 05/12/16   Tenna Delaine D, PA-C  FARXIGA 10 MG TABS tablet Take 10 mg by mouth daily. 11/20/15   [provider]  glimepiride (AMARYL) 4 MG tablet Take 4 mg by mouth 2 (two) times daily.    [provider]  lisinopril (PRINIVIL,ZESTRIL) 40 MG tablet Take 40 mg by mouth daily.  01/17/15   [provider]    metFORMIN (GLUCOPHAGE) 850 MG tablet Take 1 tablet (850 mg total) by mouth 3 (three) times daily. 07/03/13   Piepenbrink, Anderson Malta, PA-C  metoprolol tartrate (LOPRESSOR) 25 MG tablet Take 1 tablet (25 mg total) by mouth 2 (two) times daily. 06/28/15   Nita Sells, MD  ondansetron (ZOFRAN ODT) 4 MG disintegrating tablet 4mg  ODT q4 hours prn nausea/vomit Patient not taking: Reported on 12/04/2015 02/25/15   Deno Etienne, DO  prednisoLONE acetate (PRED FORTE) 1 % ophthalmic suspension Place 1 drop into the left eye 3 (three) times daily. 10/30/15   [provider]  PROLENSA 0.07 % SOLN Place 1 drop into the left eye 3 (three) times daily. 10/29/15   [provider]  trimethoprim-polymyxin b (POLYTRIM) ophthalmic solution Place 1 drop into the left eye 3 (three) times daily. 10/30/15   [provider]     Allergies:    No Known Allergies   Physical Exam:   Vitals  Blood pressure (S) (!) 154/92, pulse 85, temperature 98.1 F (36.7 C), temperature source Oral, resp. rate 12, height 6\' 2"  (1.88 m), weight 115.2 kg (253 lb 15.5 oz), SpO2 96 %.   1. General  lying in bed in NAD,   2. Normal affect and insight, Not Suicidal or Homicidal, Awake Alert, Oriented X 3.  3. No F.N deficits, ALL C.Nerves Intact, Strength 5/5 all 4 extremities, Sensation intact all 4 extremities, Plantars down going.  4. Ears and Eyes appear Normal, Conjunctivae clear, PERRLA. Moist Oral Mucosa.  5. Supple Neck, No JVD, No cervical lymphadenopathy appriciated, No Carotid Bruits.  6. Symmetrical Chest wall movement, Good air movement bilaterally, CTAB.  7. RRR, No Gallops, Rubs or Murmurs, No Parasternal Heave.  8. Positive Bowel Sounds, Abdomen Soft, No tenderness, No organomegaly appriciated,No rebound -guarding or rigidity.  9.  No Cyanosis, Normal Skin Turgor, No Skin Rash or Bruise.  10. Good muscle tone,  joints appear normal , no effusions, Normal ROM.  11. No Palpable Lymph  Nodes in Neck or Axillae  Good finger to nose   Data Review:    CBC No results for input(s):  WBC, HGB, HCT, PLT, MCV, MCH, MCHC, RDW, LYMPHSABS, MONOABS, EOSABS, BASOSABS, BANDABS in the last 168 hours.  Invalid input(s): NEUTRABS, BANDSABD ------------------------------------------------------------------------------------------------------------------  Chemistries  No results for input(s): NA, K, CL, CO2, GLUCOSE, BUN, CREATININE, CALCIUM, MG, AST, ALT, ALKPHOS, BILITOT in the last 168 hours.  Invalid input(s): GFRCGP ------------------------------------------------------------------------------------------------------------------ CrCl cannot be calculated (Patient's most recent lab result is older than the maximum 21 days allowed.). ------------------------------------------------------------------------------------------------------------------ No results for input(s): TSH, T4TOTAL, T3FREE, THYROIDAB in the last 72 hours.  Invalid input(s): FREET3  Coagulation profile No results for input(s): INR, PROTIME in the last 168 hours. ------------------------------------------------------------------------------------------------------------------- No results for input(s): DDIMER in the last 72 hours. -------------------------------------------------------------------------------------------------------------------  Cardiac Enzymes No results for input(s): CKMB, TROPONINI, MYOGLOBIN in the last 168 hours.  Invalid input(s): CK ------------------------------------------------------------------------------------------------------------------ No results found for: BNP   ---------------------------------------------------------------------------------------------------------------  Urinalysis    Component Value Date/Time   COLORURINE YELLOW 07/02/2013 2112   APPEARANCEUR CLEAR 07/02/2013 2112   LABSPEC 1.036 (H) 07/02/2013 2112   PHURINE 5.5 07/02/2013 2112   GLUCOSEU >1000 (A)  07/02/2013 2112   HGBUR NEGATIVE 07/02/2013 2112   BILIRUBINUR NEGATIVE 07/02/2013 2112   KETONESUR NEGATIVE 07/02/2013 2112   PROTEINUR NEGATIVE 07/02/2013 2112   UROBILINOGEN 0.2 07/02/2013 2112   NITRITE NEGATIVE 07/02/2013 2112   LEUKOCYTESUR NEGATIVE 07/02/2013 2112    ----------------------------------------------------------------------------------------------------------------   Imaging Results:    No results found.    Assessment & Plan:    Active Problems:   Hypertension   Type 2 diabetes mellitus without complication, without long-term current use of insulin ([facility])   Syncope    Dizziness /Vertigo MRI brain negative for CVA Telemetry Check carotid ultrasound, cardiac echo PT to evaluate and tx,  Meclizine  Dm2 FSBS ISS Cont metformin , cont farxiga  Hypertension Continue current bp medications  CAD Cont aspirin , lipitor, metoprolol, lisinopril  DVT Prophylaxis  SCDs   AM Labs Ordered, also please review Full Orders  Family Communication: Admission, patients condition and plan of care including tests being ordered have been discussed with the patient  who indicate understanding and agree with the plan and Code Status.  Code Status FULL CODE  Likely DC to  home  Condition GUARDED    Consults called: PT to evaluate  Admission status: observation  Time spent in minutes : 45 minutes   Jani Gravel M.D on 08/04/2016 at 1:04 AM  Between 7am to 7pm - Pager - [phone]  . After 7pm go to [email or web address] - password [facility]  [facility] - Office  [phone]

## 2016-08-04 NOTE — Progress Notes (Addendum)
Admission note:  Arrival Method: Patient arrived in w/c accompanied by the family.   Mental Orientation: Alert and oriented x 4. Telemetry: 6E-18, CCMD notified, NSR. Skin: No open areas, rashes or any abrasions noted, skin warm and dry. IV: Right hand x2, Left FA x1,  All of them saline lock. Pain: C/o H/A, 4E nurse told that he got Morphine, will continue to monitor and see if he can have anything, 6/10 pain noted. Tubes: N/A Safety Measures: Bed in low position, call bell and phone within reach. Fall Prevention Safety Plan: Reviewed the plan, understood and acknowledged. Admission Screening: Completed 6700 Orientation: Patient has been oriented to the unit, staff and to the room.

## 2016-08-04 NOTE — Progress Notes (Signed)
Patient ID: [name], male   DOB: 29-Sep-1965, 51 y.o.   MRN: [id number]  PROGRESS NOTE    Cincere Zorn  QIO:[id number] DOB: 1965/03/28 DOA: 08/03/2016 PCP: Cher Nakai, MD   Brief Narrative:  51 year male with history of coronary artery disease status post CABG, hypertension and diabetes presented with complains of frontal headache with photophobia and nausea to [facility]. CT scan of the brain and MRI of the brain was negative patient. Patient had LP which was negative.   Assessment & Plan:   Principal Problem:   Vertigo Active Problems:   Hypertension   Type 2 diabetes mellitus without complication, without long-term current use of insulin (HCC)   CAD (coronary artery disease)    Dizziness /Vertigo with headache -MRI brain negative for CVA. LP was negative apparently at [facility] - Follow carotid ultrasound, cardiac echo - PT evaluation. Patient might need outpatient PT/vestibular evaluation - Patient still complaining of significant headache. Oral tramadol/IV morphine. If headache does not improve, we will get neurology evaluation  Dm2 Continue sliding scale insulin coverage Cont metformin , glimepiride and farxiga  Hypertension Continue lisinopril and metoprolol. Blood pressure stable  CAD Cont aspirin , lipitor, metoprolol, lisinopril    DVT prophylaxis: SCDs Code Status:  Full Family Communication: None at bedside Disposition Plan: Probable discharge tomorrow if headache improves  Consultants: None   Procedures: Echo pending   Antimicrobials: None Subjective: Patient seen and examined at bedside. He still is complaining of significant headache but no current nausea or vomiting.  Objective: Vitals:   08/04/16 0800 08/04/16 0834 08/04/16 1100 08/04/16 1528  BP: 119/73   122/61  Pulse: (!) 53   (!) 56  Resp: 12   15  Temp:  (!) 97.3 F (36.3 C) 98.7 F (37.1 C)   TempSrc:  Oral Oral   SpO2:  100%    Weight:      Height:         Intake/Output Summary (Last 24 hours) at 08/04/16 1548 Last data filed at 08/04/16 0900  Gross per 24 hour  Intake              480 ml  Output             1000 ml  Net             -520 ml   Filed Weights   08/03/16 2330  Weight: 115.2 kg (253 lb 15.5 oz)    Examination:  General exam: Appears calm and comfortable  Respiratory system: Bilateral decreased breath sound at bases Cardiovascular system: S1 & S2 heard, Rate controlled  Gastrointestinal system: Abdomen is nondistended, soft and nontender. Normal bowel sounds heard. Central nervous system: Alert and oriented. No focal neurological deficits. Moving extremities Extremities: No cyanosis, clubbing, edema     Data Reviewed: I have personally reviewed following labs and imaging studies  CBC:  Recent Labs Lab 08/04/16 0526  WBC 10.6*  HGB 14.2  HCT 41.6  MCV 87.0  PLT 324   Basic Metabolic Panel:  Recent Labs Lab 08/04/16 0526  NA 136  K 4.6  CL 107  CO2 22  GLUCOSE 146*  BUN 15  CREATININE 1.02  CALCIUM 9.0   GFR: Estimated Creatinine Clearance: 114.3 mL/min (by C-G formula based on SCr of 1.02 mg/dL). Liver Function Tests:  Recent Labs Lab 08/04/16 0526  AST 17  ALT 18  ALKPHOS 50  BILITOT 0.6  PROT 6.4*  ALBUMIN 3.3*   No results for  input(s): LIPASE, AMYLASE in the last 168 hours. No results for input(s): AMMONIA in the last 168 hours. Coagulation Profile: No results for input(s): INR, PROTIME in the last 168 hours. Cardiac Enzymes:  Recent Labs Lab 08/03/16 2337 08/04/16 0526 08/04/16 1202  TROPONINI <0.03 <0.03 <0.03   BNP (last 3 results) No results for input(s): PROBNP in the last 8760 hours. HbA1C: No results for input(s): HGBA1C in the last 72 hours. CBG:  Recent Labs Lab 08/03/16 2326 08/04/16 0827 08/04/16 1217  GLUCAP 203* 119* 108*   Lipid Profile: No results for input(s): CHOL, HDL, LDLCALC, TRIG, CHOLHDL, LDLDIRECT in the last 72 hours. Thyroid Function  Tests:  Recent Labs  08/04/16 0526  TSH 0.573   Anemia Panel: No results for input(s): VITAMINB12, FOLATE, FERRITIN, TIBC, IRON, RETICCTPCT in the last 72 hours. Sepsis Labs: No results for input(s): PROCALCITON, LATICACIDVEN in the last 168 hours.  Recent Results (from the past 240 hour(s))  MRSA PCR Screening     Status: None   Collection Time: 08/03/16 11:47 PM  Result Value Ref Range Status   MRSA by PCR NEGATIVE NEGATIVE Final    Comment:        The GeneXpert MRSA Assay (FDA approved for NASAL specimens only), is one component of a comprehensive MRSA colonization surveillance program. It is not intended to diagnose MRSA infection nor to guide or monitor treatment for MRSA infections.          Radiology Studies: No results found.      Scheduled Meds: . aspirin  325 mg Oral Daily  . atorvastatin  80 mg Oral q1800  . canagliflozin  100 mg Oral QAC breakfast  . citalopram  20 mg Oral Daily  . glimepiride  4 mg Oral BID WC  . insulin aspart  0-5 Units Subcutaneous QHS  . insulin aspart  0-9 Units Subcutaneous TID WC  . ketorolac  1 drop Left Eye QID  . lisinopril  40 mg Oral Daily  . metFORMIN  850 mg Oral TID WC  . metoprolol tartrate  25 mg Oral BID  . prednisoLONE acetate  1 drop Left Eye TID   Continuous Infusions:   LOS: 1 day        Aline August, MD [facility] Pager [phone]  If 7PM-7AM, please contact night-coverage [email or web address] Password [facility] 08/04/2016, 3:48 PM

## 2016-08-04 NOTE — Evaluation (Addendum)
Physical Therapy Evaluation Patient Details Name: [name] MRN: [id number] DOB: 12/08/1965 Today's Date: 08/04/2016   History of Present Illness  Pt admit with ? syncope.  Work up thus far unremarkable.  Thought to have vertigo.   Clinical Impression  Pt admitted with above diagnosis. Pt currently with functional limitations due to the deficits listed below (see PT Problem List). Pt with positive hallpike dix for right BPPV.  Treated pt with Epley maneuver for right BPPV with pt stating that dizziness not present after treatment.  Pt exhausted from being up all night therefore left resting in bed with HOB >20 degrees.  Feel that pt can f/u at Outpt PT for vestibular rehab as needed if MD deems that pt is ok for d/c today. Gave handouts regarding BPPV and treatment as well as Brandt Daroff exercise.  Will check back tomorrow if pt still in hospital but anticipate pt can d/c today if he feels like and MD agrees.   Pt will benefit from skilled PT if repositioning needs to be repeated.    Follow Up Recommendations Outpatient PT;Supervision - Intermittent (for vestibular rehab)    Equipment Recommendations  None recommended by PT    Recommendations for Other Services       Precautions / Restrictions Precautions Precautions: Fall Restrictions Weight Bearing Restrictions: No      Mobility  Bed Mobility Overal bed mobility: Independent             General bed mobility comments: Treated with canalith repositioning maneuver for right BPPV.  No dizziness post procedure.  Pt fatigued as he was up all night therefore let pt lie back down.   Transfers Overall transfer level: Independent                               General Gait Details: NT due to test performed and let pt rest after canalith repositioning as well as nurse brought in pts pain meds.                    Balance Overall balance assessment: No apparent balance deficits (not formally assessed)                                            Pertinent Vitals/Pain Pain Assessment: Faces Faces Pain Scale: Hurts little more Pain Location: headache Pain Descriptors / Indicators: Aching Pain Intervention(s): Limited activity within patient's tolerance;Monitored during session;Repositioned;RN gave pain meds during session    Home Living Family/patient expects to be discharged to:: Private residence Living Arrangements: Other relatives Available Help at Discharge: Family;Available 24 hours/day (sister) Type of Home: House Home Access: Stairs to enter Entrance Stairs-Rails: Left Entrance Stairs-Number of Steps: 6 Home Layout: One level Home Equipment: None      Prior Function Level of Independence: Independent         Comments: works as material handler, drives.       Hand Dominance        Extremity/Trunk Assessment   Upper Extremity Assessment Upper Extremity Assessment: Defer to OT evaluation    Lower Extremity Assessment Lower Extremity Assessment: Overall WFL for tasks assessed    Cervical / Trunk Assessment Cervical / Trunk Assessment: Normal  Communication   Communication: No difficulties  Cognition Arousal/Alertness: Awake/alert Behavior During Therapy: WFL for tasks assessed/performed Overall Cognitive Status:   Within Functional Limits for tasks assessed                                           Exercises Other Exercises Other Exercises: instructed pt in Brandt Daroff exercise and gave handout   Assessment/Plan    PT Assessment All further PT needs can be met in the next venue of care  PT Problem List Decreased activity tolerance;Decreased mobility;Other (comment) (BPPV, dizziness)       PT Treatment Interventions      PT Goals (Current goals can be found in the Care Plan section)  Acute Rehab PT Goals Patient Stated Goal: to go home PT Goal Formulation: With patient Time For Goal Achievement: 08/06/16 Potential  to Achieve Goals: Good     AM-PAC PT "6 Clicks" Daily Activity  Outcome Measure Difficulty turning over in bed (including adjusting bedclothes, sheets and blankets)?: None Difficulty moving from lying on back to sitting on the side of the bed? : None Difficulty sitting down on and standing up from a chair with arms (e.g., wheelchair, bedside commode, etc,.)?: None Help needed moving to and from a bed to chair (including a wheelchair)?: None Help needed walking in hospital room?: A Little Help needed climbing 3-5 steps with a railing? : A Little 6 Click Score: 22    End of Session   Activity Tolerance: Patient tolerated treatment well Patient left: in bed;with call bell/phone within reach;with nursing/sitter in room Nurse Communication: Mobility status PT Visit Diagnosis: BPPV;Dizziness and giddiness (R42) BPPV - Right/Left : Right    Time: 0804-0834 PT Time Calculation (min) (ACUTE ONLY): 30 min   Charges:   PT Evaluation $PT Eval Low Complexity: 1 Procedure PT Treatments $Canalith Rep Proc: 8-22 mins   PT G Codes:   PT G-Codes **NOT FOR INPATIENT CLASS** Functional Assessment Tool Used: AM-PAC 6 Clicks Basic Mobility Functional Limitation: Mobility: Walking and moving around Mobility: Walking and Moving Around Current Status (G8978): At least 1 percent but less than 20 percent impaired, limited or restricted Mobility: Walking and Moving Around Goal Status (G8979): 0 percent impaired, limited or restricted     White,PT Acute Rehabilitation [phone] (pager)    F White 08/04/2016, 8:43 AM   

## 2016-08-05 ENCOUNTER — Observation Stay (HOSPITAL_BASED_OUTPATIENT_CLINIC_OR_DEPARTMENT_OTHER): Payer: Managed Care, Other (non HMO)

## 2016-08-05 ENCOUNTER — Observation Stay (HOSPITAL_COMMUNITY): Payer: Managed Care, Other (non HMO)

## 2016-08-05 DIAGNOSIS — R42 Dizziness and giddiness: Secondary | ICD-10-CM | POA: Diagnosis not present

## 2016-08-05 DIAGNOSIS — M79609 Pain in unspecified limb: Secondary | ICD-10-CM

## 2016-08-05 LAB — CBC WITH DIFFERENTIAL/PLATELET
BASOS ABS: 0 10*3/uL (ref 0.0–0.1)
Basophils Relative: 0 %
EOS ABS: 0 10*3/uL (ref 0.0–0.7)
EOS PCT: 0 %
HCT: 39.8 % (ref 39.0–52.0)
HEMOGLOBIN: 13.3 g/dL (ref 13.0–17.0)
LYMPHS ABS: 5.5 10*3/uL — AB (ref 0.7–4.0)
Lymphocytes Relative: 45 %
MCH: 29.2 pg (ref 26.0–34.0)
MCHC: 33.4 g/dL (ref 30.0–36.0)
MCV: 87.3 fL (ref 78.0–100.0)
Monocytes Absolute: 0.5 10*3/uL (ref 0.1–1.0)
Monocytes Relative: 4 %
NEUTROS PCT: 51 %
Neutro Abs: 6.2 10*3/uL (ref 1.7–7.7)
Platelets: 219 10*3/uL (ref 150–400)
RBC: 4.56 MIL/uL (ref 4.22–5.81)
RDW: 13.8 % (ref 11.5–15.5)
WBC: 12.3 10*3/uL — AB (ref 4.0–10.5)

## 2016-08-05 LAB — BASIC METABOLIC PANEL
ANION GAP: 7 (ref 5–15)
BUN: 21 mg/dL — ABNORMAL HIGH (ref 6–20)
CO2: 24 mmol/L (ref 22–32)
Calcium: 8.9 mg/dL (ref 8.9–10.3)
Chloride: 107 mmol/L (ref 101–111)
Creatinine, Ser: 1.13 mg/dL (ref 0.61–1.24)
GFR calc non Af Amer: >60 mL/min (ref >60)
Glucose, Bld: 86 mg/dL (ref 65–99)
POTASSIUM: 4.6 mmol/L (ref 3.5–5.1)
SODIUM: 138 mmol/L (ref 135–145)

## 2016-08-05 LAB — GLUCOSE, CAPILLARY
GLUCOSE-CAPILLARY: 91 mg/dL (ref 65–99)
GLUCOSE-CAPILLARY: 92 mg/dL (ref 65–99)

## 2016-08-05 LAB — HEMOGLOBIN A1C
HEMOGLOBIN A1C: 8.9 % — AB (ref 4.8–5.6)
MEAN PLASMA GLUCOSE: 209 mg/dL

## 2016-08-05 LAB — MAGNESIUM: MAGNESIUM: 2 mg/dL (ref 1.7–2.4)

## 2016-08-05 MED ORDER — IBUPROFEN 600 MG PO TABS: 600.0000 mg | ORAL_TABLET | Freq: Three times a day (TID) | ORAL | 0 refills | Status: DC | PRN

## 2016-08-05 MED ORDER — IBUPROFEN 600 MG PO TABS: 600.0000 mg | ORAL_TABLET | Freq: Three times a day (TID) | ORAL | Status: DC | PRN

## 2016-08-05 MED ORDER — TRAMADOL HCL 50 MG PO TABS: 50.0000 mg | ORAL_TABLET | Freq: Four times a day (QID) | ORAL | 0 refills | Status: DC | PRN

## 2016-08-05 NOTE — Progress Notes (Signed)
Pt was discharged to home. Discharge instructions, medications and follow up appointments discussed and reviewed with pt, verbalized understanding. Telemetry dc'ed, CCMD notified. IV dc'ed, site clean and dry. Pt was escorted out of the unit in wheelchair, took all belongings with him.

## 2016-08-05 NOTE — Progress Notes (Signed)
Preliminary results by tech - Right Lower Ext. Venous Completed. Negative for deep and superficial vein thrombosis. Almeter Westhoff, BS, RDMS, RVT  

## 2016-08-05 NOTE — Progress Notes (Signed)
Rolling walker delivered at bedside by [facility].

## 2016-08-05 NOTE — Care Management Note (Signed)
Case Management Note  Patient Details  Name: [name] MRN: [id number] Date of Birth: September 13, 1965  Subjective/Objective:                 Rolling Walker to be delivered to room prior to DC by [facility], referral made to [facility]. Referral made for vestibular PT to [facility], entered on AVS. No other CM needs identified on DC.    Action/Plan:  DC to home. Expected Discharge Date:  08/06/16               Expected Discharge Plan:  OP Rehab  In-House Referral:     Discharge planning Services  CM Consult  Post Acute Care Choice:  Durable Medical Equipment Choice offered to:  Patient  DME Arranged:  Gilford Rile rolling DME Agency:  [place]:    [place]:     Status of Service:  Completed, signed off  If discussed at [place] of Stay Meetings, dates discussed:    Additional Comments:  Carles Collet, RN 08/05/2016, 11:45 AM

## 2016-08-05 NOTE — Progress Notes (Signed)
Physical Therapy Treatment Patient Details Name: [name] MRN: [id number] DOB: 1966-01-30 Today's Date: 08/05/2016    History of Present Illness Pt admit with ? syncope.  Work up thus far unremarkable.  Thought to have vertigo.     PT Comments    Pt admitted with above diagnosis. Pt currently with functional limitations due to balance and right knee pain deficits. Pt with right knee pain causing difficultyw ith ambulation today.  Had to use RW . Dizziness improved per pt and he is doing [employer] prn.  Will continue to follow.  Pt will benefit from skilled PT to increase their independence and safety with mobility to allow discharge to the venue listed below.     Follow Up Recommendations  Outpatient PT;Supervision - Intermittent (for vestibular rehab)     Equipment Recommendations  Rolling walker with 5" wheels    Recommendations for Other Services       Precautions / Restrictions Precautions Precautions: Fall Restrictions Weight Bearing Restrictions: No    Mobility  Bed Mobility Overal bed mobility: Independent             General bed mobility comments: Pt stated his dizziness is only a 1/10.  He did Laruth Bouchard daroff last night and he doesn't feel that he needs repeat Epley.  Transfers Overall transfer level: Needs assistance Equipment used: None Transfers: Sit to/from Stand Sit to Stand: Supervision;Min guard         General transfer comment: On standing, pt notes right knee pain and needing steadying assist.    Ambulation/Gait Ambulation/Gait assistance: Min guard;Min assist Ambulation Distance (Feet): 250 Feet Assistive device: Rolling walker (2 wheeled);None Gait Pattern/deviations: Step-to pattern;Decreased step length - right;Decreased stance time - right;Decreased weight shift to right;Decreased stride length;Antalgic;Staggering left;Staggering right   Gait velocity interpretation: Below normal speed for age/gender General Gait Details: Pt  immediately c/o right knee pain when ambulation initiated.  Pt with antalgic gait holding onto rail in hallway and actually needed min assist to correct balance at times.  Bruise and swelling noted on right knee. Obtained RW and pt did much better with RW.  Cues for technique throughout.    Stairs Stairs: Yes   Stair Management: One rail Right;Step to pattern;Forwards;Sideways Number of Stairs: 5 General stair comments: Pt went up forwards holding rail and down sideways holding rail nneding min guard assist at times as pt had difificulty due to right knee pain.   Wheelchair Mobility    Modified Rankin (Stroke Patients Only)       Balance Overall balance assessment: Needs assistance Sitting-balance support: No upper extremity supported;Feet supported Sitting balance-Leahy Scale: Good     Standing balance support: Bilateral upper extremity supported;During functional activity Standing balance-Leahy Scale: Poor Standing balance comment: Pt needed bil UE support for balance due to right knee pain.                             Cognition Arousal/Alertness: Awake/alert Behavior During Therapy: WFL for tasks assessed/performed Overall Cognitive Status: Within Functional Limits for tasks assessed                                        Exercises      General Comments General comments (skin integrity, edema, etc.): nurse notified of pts right knee pain.       Pertinent Vitals/Pain Pain Assessment:  Faces Faces Pain Scale: Hurts even more Pain Location: right knee Pain Descriptors / Indicators: Aching;Grimacing;Guarding Pain Intervention(s): Limited activity within patient's tolerance;Monitored during session;Repositioned;Ice applied    Home Living                      Prior Function            PT Goals (current goals can now be found in the care plan section) Acute Rehab PT Goals PT Goal Formulation: With patient Time For Goal  Achievement: 08/13/16 Potential to Achieve Goals: Good Progress towards PT goals: Progressing toward goals    Frequency    Min 2X/week      PT Plan Current plan remains appropriate;Equipment recommendations need to be updated    Co-evaluation              AM-PAC PT "6 Clicks" Daily Activity  Outcome Measure  Difficulty turning over in bed (including adjusting bedclothes, sheets and blankets)?: None Difficulty moving from lying on back to sitting on the side of the bed? : None Difficulty sitting down on and standing up from a chair with arms (e.g., wheelchair, bedside commode, etc,.)?: None Help needed moving to and from a bed to chair (including a wheelchair)?: None Help needed walking in hospital room?: A Little Help needed climbing 3-5 steps with a railing? : A Little 6 Click Score: 22    End of Session Equipment Utilized During Treatment: Gait belt Activity Tolerance: Patient limited by pain Patient left: with call bell/phone within reach;in chair;with nursing/sitter in room Nurse Communication: Mobility status;Other (comment) (nurse notified of right knee pain and swelling and called MD) PT Visit Diagnosis: BPPV;Dizziness and giddiness (R42);Muscle weakness (generalized) (M62.81);Unsteadiness on feet (R26.81);Pain BPPV - Right/Left : Right Pain - Right/Left: Right Pain - part of body: Knee     Time: 0757-0820 PT Time Calculation (min) (ACUTE ONLY): 23 min  Charges:  $Gait Training: 23-37 mins                    G Codes:       Arlo Butt,PT Acute Rehabilitation [phone] (pager)    Denice Paradise 08/05/2016, 11:05 AM

## 2016-08-05 NOTE — Discharge Summary (Signed)
Physician Discharge Summary  [name] TWS:[id number] DOB: [birth year] DOA: 08/03/2016  PCP: Cher Nakai, MD  Admit date: 08/03/2016 Discharge date: 08/05/2016  Admitted From: Home  Disposition:  Home  Recommendations for Outpatient Follow-up:  1. Follow up with PCP in 1-2 weeks 2. Please obtain BMP/CBC in one week 3. Please follow with outpatient vestibular rehabilitation. 4. Please follow up with orthopedics as an outpatient if right knee pain worsens.  Home Health: No Equipment/Devices: None  Discharge Condition: Stable  CODE STATUS: Full  Diet recommendation: Heart Healthy / Carb Modified Brief/Interim Summary: 51 year male with history of coronary artery disease status post CABG, hypertension and diabetes presented with complains of frontal headache with photophobia and nausea to [facility]. CT scan of the brain and MRI of the brain was negative patient. Patient had LP which was negative.  Discharge Diagnoses:  Principal Problem:   Vertigo Active Problems:   Hypertension   Type 2 diabetes mellitus without complication, without long-term current use of insulin (HCC)   CAD (coronary artery disease)  Dizziness /Vertigo with headache - Probably from BPPV. Improving. Patient will benefit from outpatient vestibular evaluation. - MRI brain negative for CVA. LP was negative apparently at [facility] - Carotid ultrasound did not reveal significant stenosis - Headache is improving. Discharge patient home today.  Dm2 Cont metformin , glimepiride and farxiga on discharge Hemoglobin A1c is 8.9. Outpatient follow-up with primary care provider  Hypertension Continue lisinopril and metoprolol. Blood pressure stable  CAD Cont aspirin , lipitor, metoprolol, lisinopril  Right knee pain - Probably secondary to recent fall and trauma. X-ray of the right knee is negative for fracture. Lower extremity duplex ultrasound is negative for DVT in the right lower extremity.  Outpatient follow-up with orthopedics if needed.  Discharge Instructions  Discharge Instructions    Call MD for:  difficulty breathing, headache or visual disturbances    Complete by:  As directed    Call MD for:  extreme fatigue    Complete by:  As directed    Call MD for:  hives    Complete by:  As directed    Call MD for:  persistant dizziness or light-headedness    Complete by:  As directed    Call MD for:  persistant nausea and vomiting    Complete by:  As directed    Call MD for:  severe uncontrolled pain    Complete by:  As directed    Call MD for:  temperature >100.4    Complete by:  As directed    Diet - low sodium heart healthy    Complete by:  As directed    Diet Carb Modified    Complete by:  As directed    Increase activity slowly    Complete by:  As directed      Allergies as of 08/05/2016   No Known Allergies     Medication List    STOP taking these medications   acyclovir 400 MG tablet Commonly known as:  ZOVIRAX   doxycycline 100 MG capsule Commonly known as:  VIBRAMYCIN     TAKE these medications   aspirin 325 MG EC tablet Take 1 tablet (325 mg total) by mouth daily.   atorvastatin 80 MG tablet Commonly known as:  LIPITOR Take 1 tablet (80 mg total) by mouth daily at 6 PM.   citalopram 20 MG tablet Commonly known as:  CELEXA Take 20 mg by mouth daily.   FARXIGA 10 MG Tabs tablet  Generic drug:  dapagliflozin propanediol Take 10 mg by mouth daily.   glimepiride 4 MG tablet Commonly known as:  AMARYL Take 4 mg by mouth 2 (two) times daily.   ibuprofen 600 MG tablet Commonly known as:  ADVIL,MOTRIN Take 1 tablet (600 mg total) by mouth every 8 (eight) hours as needed for moderate pain.   JANUVIA 100 MG tablet Generic drug:  sitaGLIPtin Take 100 mg by mouth daily.   lisinopril 40 MG tablet Commonly known as:  PRINIVIL,ZESTRIL Take 40 mg by mouth daily.   lovastatin 40 MG tablet Commonly known as:  MEVACOR Take 40 mg by mouth  daily.   metFORMIN 850 MG tablet Commonly known as:  GLUCOPHAGE Take 1 tablet (850 mg total) by mouth 3 (three) times daily.   metoprolol tartrate 25 MG tablet Commonly known as:  LOPRESSOR Take 1 tablet (25 mg total) by mouth 2 (two) times daily.   ondansetron 4 MG disintegrating tablet Commonly known as:  ZOFRAN ODT 4mg  ODT q4 hours prn nausea/vomit   prednisoLONE acetate 1 % ophthalmic suspension Commonly known as:  PRED FORTE Place 1 drop into the left eye 3 (three) times daily.   PROLENSA 0.07 % Soln Generic drug:  Bromfenac Sodium Place 1 drop into the left eye 3 (three) times daily.   traMADol 50 MG tablet Commonly known as:  ULTRAM Take 1 tablet (50 mg total) by mouth every 6 (six) hours as needed for moderate pain or severe pain.   trimethoprim-polymyxin b ophthalmic solution Commonly known as:  POLYTRIM Place 1 drop into the left eye 3 (three) times daily.            Durable Medical Equipment        Start     Ordered   08/05/16 1143  For home use only DME Walker rolling  Once    Question:  Patient needs a walker to treat with the following condition  Answer:  Vertigo   08/05/16 1142     Follow-up Information    [facility] [facility].   Specialty:  Rehabilitation Why:  They will call in 3-5 to set up outpatient vestibular PT.  Contact information: [address] [place] [id number] [place] [facility].   Why:  For RW to be delivered to room prior to discharge.  Contact information: [address] [place] [address] [phone]          No Known Allergies  Consultations:  None   Procedures/Studies: Dg Knee 3 View Right  Result Date: 08/05/2016 CLINICAL DATA:  Right knee pain. EXAM: RIGHT KNEE - 3 VIEW COMPARISON:  MRI 08/06/2011 . FINDINGS: No acute bony or joint abnormality identified. No evidence of fracture  or dislocation. Soft tissues are unremarkable. IMPRESSION: No acute abnormality. Electronically Signed   By: Marcello Moores  Register   On: 08/05/2016 09:18    Ultrasound carotids on 08/04/2016: 1-39 percent stenosis involving the   right internal carotid artery and the left internal carotid   artery. - Difficult to visualize the right vertebral artery, small segment   seen appears patent and antegrade. Left vertebral artery appears   patent and antegrade.  Echo on 08/04/16: - Left ventricle: The cavity size was normal. Wall thickness was   increased in a pattern of mild LVH. Systolic function was normal.   The estimated ejection fraction was in the range of 55% to  60%.   Wall motion was normal; there were no regional wall motion   abnormalities.  Right lower extremity DUPLEX ON 08/05/2016: NO DVT     Subjective: Patient seen and examined at bedside. No overnight fever, nausea, vomiting. His headache is improving. He complains of right knee pain.  Discharge Exam: Vitals:   08/05/16 0945 08/05/16 1004  BP:  126/68  Pulse: 63 (!) 55  Resp:  16  Temp:  97.6 F (36.4 C)   Vitals:   08/05/16 0500 08/05/16 0502 08/05/16 0945 08/05/16 1004  BP:  109/60  126/68  Pulse:  (!) 56 63 (!) 55  Resp:  16  16  Temp:  97.6 F (36.4 C)  97.6 F (36.4 C)  TempSrc:  Oral  Oral  SpO2:  98%  99%  Weight: 108.9 kg (240 lb 1.3 oz)     Height:        General: Pt is alert, awake, not in acute distress Cardiovascular: RRR, S1/S2 + Respiratory: Bilateral decreased breath sounds at bases  Abdominal: Soft, NT, ND, bowel sounds + Extremities: no edema, no cyanosis. Right knee mildly tender but no obvious swelling or erythema.    The results of significant diagnostics from this hospitalization (including imaging, microbiology, ancillary and laboratory) are listed below for reference.     Microbiology: Recent Results (from the past 240 hour(s))  MRSA PCR Screening     Status: None   Collection  Time: 08/03/16 11:47 PM  Result Value Ref Range Status   MRSA by PCR NEGATIVE NEGATIVE Final    Comment:        The GeneXpert MRSA Assay (FDA approved for NASAL specimens only), is one component of a comprehensive MRSA colonization surveillance program. It is not intended to diagnose MRSA infection nor to guide or monitor treatment for MRSA infections.      Labs: BNP (last 3 results) No results for input(s): BNP in the last 8760 hours. Basic Metabolic Panel:  Recent Labs Lab 08/04/16 0526 08/05/16 0433  NA 136 138  K 4.6 4.6  CL 107 107  CO2 22 24  GLUCOSE 146* 86  BUN 15 21*  CREATININE 1.02 1.13  CALCIUM 9.0 8.9  MG  --  2.0   Liver Function Tests:  Recent Labs Lab 08/04/16 0526  AST 17  ALT 18  ALKPHOS 50  BILITOT 0.6  PROT 6.4*  ALBUMIN 3.3*   No results for input(s): LIPASE, AMYLASE in the last 168 hours. No results for input(s): AMMONIA in the last 168 hours. CBC:  Recent Labs Lab 08/04/16 0526 08/05/16 0433  WBC 10.6* 12.3*  NEUTROABS  --  6.2  HGB 14.2 13.3  HCT 41.6 39.8  MCV 87.0 87.3  PLT 222 219   Cardiac Enzymes:  Recent Labs Lab 08/03/16 2337 08/04/16 0526 08/04/16 1202  TROPONINI <0.03 <0.03 <0.03   BNP: Invalid input(s): POCBNP CBG:  Recent Labs Lab 08/04/16 1701 08/04/16 1742 08/04/16 2254 08/05/16 0801 08/05/16 1139  GLUCAP 50* 87 121* 91 92   D-Dimer No results for input(s): DDIMER in the last 72 hours. Hgb A1c  Recent Labs  08/04/16 0526  HGBA1C 8.9*   Lipid Profile No results for input(s): CHOL, HDL, LDLCALC, TRIG, CHOLHDL, LDLDIRECT in the last 72 hours. Thyroid function studies  Recent Labs  08/04/16 0526  TSH 0.573   Anemia work up No results for input(s): VITAMINB12, FOLATE, FERRITIN, TIBC, IRON, RETICCTPCT in the last 72 hours. Urinalysis    Component Value Date/Time  [place] YELLOW 07/02/2013 2112   APPEARANCEUR CLEAR 07/02/2013 2112   LABSPEC 1.036 (H) 07/02/2013 2112    PHURINE 5.5 07/02/2013 2112   GLUCOSEU >1000 (A) 07/02/2013 2112   HGBUR NEGATIVE 07/02/2013 2112   BILIRUBINUR NEGATIVE 07/02/2013 2112   KETONESUR NEGATIVE 07/02/2013 2112   PROTEINUR NEGATIVE 07/02/2013 2112   UROBILINOGEN 0.2 07/02/2013 2112   NITRITE NEGATIVE 07/02/2013 2112   LEUKOCYTESUR NEGATIVE 07/02/2013 2112   Sepsis Labs Invalid input(s): PROCALCITONIN,  WBC,  LACTICIDVEN Microbiology Recent Results (from the past 240 hour(s))  MRSA PCR Screening     Status: None   Collection Time: 08/03/16 11:47 PM  Result Value Ref Range Status   MRSA by PCR NEGATIVE NEGATIVE Final    Comment:        The GeneXpert MRSA Assay (FDA approved for NASAL specimens only), is one component of a comprehensive MRSA colonization surveillance program. It is not intended to diagnose MRSA infection nor to guide or monitor treatment for MRSA infections.      Time coordinating discharge: Over 30 minutes  SIGNED:   Aline August, MD  [facility] 08/05/2016, 1:57 PM Pager: [phone]  If 7PM-7AM, please contact night-coverage [email or web address] Password [email or web address]

## 2016-09-25 ENCOUNTER — Encounter (HOSPITAL_COMMUNITY): Payer: Self-pay

## 2016-09-25 ENCOUNTER — Emergency Department (HOSPITAL_COMMUNITY): Payer: Managed Care, Other (non HMO)

## 2016-09-25 ENCOUNTER — Emergency Department (HOSPITAL_COMMUNITY)
Admission: EM | Admit: 2016-09-25 | Discharge: 2016-09-25 | Disposition: A | Discharge status: Home / Self Care | Payer: Managed Care, Other (non HMO) | Attending: Emergency Medicine | Admitting: Emergency Medicine

## 2016-09-25 DIAGNOSIS — Y998 Other external cause status: Secondary | ICD-10-CM | POA: Diagnosis not present

## 2016-09-25 DIAGNOSIS — E7800 Pure hypercholesterolemia, unspecified: Secondary | ICD-10-CM | POA: Insufficient documentation

## 2016-09-25 DIAGNOSIS — E119 Type 2 diabetes mellitus without complications: Secondary | ICD-10-CM | POA: Insufficient documentation

## 2016-09-25 DIAGNOSIS — Z79899 Other long term (current) drug therapy: Secondary | ICD-10-CM | POA: Insufficient documentation

## 2016-09-25 DIAGNOSIS — Y939 Activity, unspecified: Secondary | ICD-10-CM | POA: Diagnosis not present

## 2016-09-25 DIAGNOSIS — Z7982 Long term (current) use of aspirin: Secondary | ICD-10-CM | POA: Insufficient documentation

## 2016-09-25 DIAGNOSIS — M25512 Pain in left shoulder: Secondary | ICD-10-CM

## 2016-09-25 DIAGNOSIS — S199XXA Unspecified injury of neck, initial encounter: Secondary | ICD-10-CM | POA: Diagnosis present

## 2016-09-25 DIAGNOSIS — I10 Essential (primary) hypertension: Secondary | ICD-10-CM | POA: Diagnosis not present

## 2016-09-25 DIAGNOSIS — Y92410 Unspecified street and highway as the place of occurrence of the external cause: Secondary | ICD-10-CM | POA: Insufficient documentation

## 2016-09-25 DIAGNOSIS — Z7984 Long term (current) use of oral hypoglycemic drugs: Secondary | ICD-10-CM | POA: Insufficient documentation

## 2016-09-25 DIAGNOSIS — S161XXA Strain of muscle, fascia and tendon at neck level, initial encounter: Secondary | ICD-10-CM

## 2016-09-25 MED ORDER — NAPROXEN 500 MG PO TABS
500.0000 mg | ORAL_TABLET | Freq: Once | ORAL | Status: AC
  Administered 2016-09-25: 500 mg via ORAL
  Filled 2016-09-25: qty 1

## 2016-09-25 MED ORDER — NAPROXEN 500 MG PO TABS: 500.0000 mg | ORAL_TABLET | Freq: Two times a day (BID) | ORAL | 0 refills | Status: DC

## 2016-09-25 NOTE — Discharge Instructions (Signed)
Return to emergency department if you have any arm weakness or numbness, breathing problems, vomiting, or vision problems.

## 2016-09-25 NOTE — ED Notes (Signed)
Pt given pain medications per eMAR. Signed d/c papers. Discussed follow up appts, pain management, s/sx to return to ED. Pt verbalized understanding.
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
## 2016-09-26 ENCOUNTER — Encounter (HOSPITAL_COMMUNITY): Payer: Self-pay | Admitting: Emergency Medicine

## 2016-09-26 ENCOUNTER — Emergency Department (HOSPITAL_COMMUNITY): Payer: Managed Care, Other (non HMO)

## 2016-09-26 ENCOUNTER — Emergency Department (HOSPITAL_COMMUNITY)
Admission: EM | Admit: 2016-09-26 | Discharge: 2016-09-26 | Disposition: A | Discharge status: Home / Self Care | Payer: Managed Care, Other (non HMO) | Attending: Emergency Medicine | Admitting: Emergency Medicine

## 2016-09-26 DIAGNOSIS — I2510 Atherosclerotic heart disease of native coronary artery without angina pectoris: Secondary | ICD-10-CM | POA: Insufficient documentation

## 2016-09-26 DIAGNOSIS — Y999 Unspecified external cause status: Secondary | ICD-10-CM | POA: Diagnosis not present

## 2016-09-26 DIAGNOSIS — E119 Type 2 diabetes mellitus without complications: Secondary | ICD-10-CM | POA: Insufficient documentation

## 2016-09-26 DIAGNOSIS — Y929 Unspecified place or not applicable: Secondary | ICD-10-CM | POA: Insufficient documentation

## 2016-09-26 DIAGNOSIS — Z79899 Other long term (current) drug therapy: Secondary | ICD-10-CM | POA: Insufficient documentation

## 2016-09-26 DIAGNOSIS — Y939 Activity, unspecified: Secondary | ICD-10-CM | POA: Diagnosis not present

## 2016-09-26 DIAGNOSIS — Z955 Presence of coronary angioplasty implant and graft: Secondary | ICD-10-CM | POA: Diagnosis not present

## 2016-09-26 DIAGNOSIS — S199XXA Unspecified injury of neck, initial encounter: Secondary | ICD-10-CM | POA: Diagnosis present

## 2016-09-26 DIAGNOSIS — Z7984 Long term (current) use of oral hypoglycemic drugs: Secondary | ICD-10-CM | POA: Insufficient documentation

## 2016-09-26 DIAGNOSIS — S161XXA Strain of muscle, fascia and tendon at neck level, initial encounter: Secondary | ICD-10-CM | POA: Insufficient documentation

## 2016-09-26 DIAGNOSIS — X58XXXA Exposure to other specified factors, initial encounter: Secondary | ICD-10-CM | POA: Insufficient documentation

## 2016-09-26 DIAGNOSIS — I10 Essential (primary) hypertension: Secondary | ICD-10-CM | POA: Insufficient documentation

## 2016-09-26 DIAGNOSIS — Z7982 Long term (current) use of aspirin: Secondary | ICD-10-CM | POA: Insufficient documentation

## 2016-09-26 DIAGNOSIS — S39012A Strain of muscle, fascia and tendon of lower back, initial encounter: Secondary | ICD-10-CM | POA: Diagnosis not present

## 2016-09-26 MED ORDER — DIAZEPAM 5 MG PO TABS
5.0000 mg | ORAL_TABLET | Freq: Once | ORAL | Status: AC
  Administered 2016-09-26: 5 mg via ORAL
  Filled 2016-09-26: qty 1

## 2016-09-26 MED ORDER — HYDROMORPHONE HCL 1 MG/ML IJ SOLN
1.0000 mg | Freq: Once | INTRAMUSCULAR | Status: AC
  Administered 2016-09-26: 1 mg via INTRAMUSCULAR
  Filled 2016-09-26: qty 1
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
## 2016-09-26 NOTE — ED Notes (Signed)
Patient transported to X-ray 
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
## 2018-05-15 DIAGNOSIS — I10 Essential (primary) hypertension: Secondary | ICD-10-CM | POA: Diagnosis not present

## 2018-05-15 DIAGNOSIS — E1140 Type 2 diabetes mellitus with diabetic neuropathy, unspecified: Secondary | ICD-10-CM | POA: Diagnosis not present

## 2018-05-15 DIAGNOSIS — E7800 Pure hypercholesterolemia, unspecified: Secondary | ICD-10-CM | POA: Diagnosis not present

## 2018-05-15 DIAGNOSIS — E119 Type 2 diabetes mellitus without complications: Secondary | ICD-10-CM | POA: Diagnosis not present

## 2018-05-15 DIAGNOSIS — E291 Testicular hypofunction: Secondary | ICD-10-CM | POA: Diagnosis not present

## 2018-05-15 DIAGNOSIS — K219 Gastro-esophageal reflux disease without esophagitis: Secondary | ICD-10-CM | POA: Diagnosis not present

## 2018-05-19 ENCOUNTER — Other Ambulatory Visit: Payer: Self-pay

## 2018-05-19 ENCOUNTER — Emergency Department (HOSPITAL_COMMUNITY)
Admission: EM | Admit: 2018-05-19 | Discharge: 2018-05-19 | Disposition: A | Discharge status: Home / Self Care | Payer: Commercial Managed Care - PPO | Attending: Emergency Medicine | Admitting: Emergency Medicine

## 2018-05-19 ENCOUNTER — Encounter (HOSPITAL_COMMUNITY): Payer: Self-pay | Admitting: *Deleted

## 2018-05-19 DIAGNOSIS — Z7982 Long term (current) use of aspirin: Secondary | ICD-10-CM | POA: Insufficient documentation

## 2018-05-19 DIAGNOSIS — K029 Dental caries, unspecified: Secondary | ICD-10-CM | POA: Diagnosis not present

## 2018-05-19 DIAGNOSIS — Z7984 Long term (current) use of oral hypoglycemic drugs: Secondary | ICD-10-CM | POA: Diagnosis not present

## 2018-05-19 DIAGNOSIS — Z79899 Other long term (current) drug therapy: Secondary | ICD-10-CM | POA: Insufficient documentation

## 2018-05-19 DIAGNOSIS — I10 Essential (primary) hypertension: Secondary | ICD-10-CM | POA: Insufficient documentation

## 2018-05-19 DIAGNOSIS — Z791 Long term (current) use of non-steroidal anti-inflammatories (NSAID): Secondary | ICD-10-CM | POA: Diagnosis not present

## 2018-05-19 DIAGNOSIS — K0889 Other specified disorders of teeth and supporting structures: Secondary | ICD-10-CM | POA: Diagnosis not present

## 2018-05-19 DIAGNOSIS — E119 Type 2 diabetes mellitus without complications: Secondary | ICD-10-CM | POA: Insufficient documentation

## 2018-05-19 MED ORDER — AMOXICILLIN 500 MG PO CAPS: 500.0000 mg | ORAL_CAPSULE | Freq: Three times a day (TID) | ORAL | 0 refills | Status: DC

## 2018-05-19 MED ORDER — ONDANSETRON 4 MG PO TBDP: 4.0000 mg | ORAL_TABLET | Freq: Four times a day (QID) | ORAL | 0 refills | Status: DC | PRN

## 2018-05-19 MED ORDER — HYDROCODONE-ACETAMINOPHEN 5-325 MG PO TABS: 2.0000 | ORAL_TABLET | Freq: Four times a day (QID) | ORAL | 0 refills | Status: DC | PRN

## 2018-05-19 MED ORDER — IBUPROFEN 800 MG PO TABS
800.0000 mg | ORAL_TABLET | Freq: Once | ORAL | Status: AC
  Administered 2018-05-19: 800 mg via ORAL
  Filled 2018-05-19: qty 1

## 2018-05-19 MED ORDER — AMOXICILLIN 500 MG PO CAPS
500.0000 mg | ORAL_CAPSULE | Freq: Once | ORAL | Status: AC
  Administered 2018-05-19: 05:00:00 500 mg via ORAL
  Filled 2018-05-19: qty 1

## 2018-05-19 MED ORDER — IBUPROFEN 800 MG PO TABS: 800.0000 mg | ORAL_TABLET | Freq: Three times a day (TID) | ORAL | 0 refills | Status: DC | PRN

## 2018-05-19 MED ORDER — BUPIVACAINE-EPINEPHRINE (PF) 0.5% -1:200000 IJ SOLN
1.8000 mL | Freq: Once | INTRAMUSCULAR | Status: AC
  Administered 2018-05-19: 1.8 mL
  Filled 2018-05-19: qty 1.8

## 2018-05-19 NOTE — ED Provider Notes (Signed)
Dental Block Date/Time: 05/19/2018 4:52 AM Performed by: Joanne Gavel, PA-C Authorized by: Joanne Gavel, PA-C   Consent:    Consent obtained:  Verbal   Consent given by:  Patient   Risks discussed:  Allergic reaction, infection, nerve damage, swelling, unsuccessful block, pain, intravascular injection and hematoma   Alternatives discussed:  No treatment Indications:    Indications: dental pain   Location:    Block type:  Inferior alveolar   Laterality:  Right Procedure details (see MAR for exact dosages):    Topical anesthetic:  Benzocaine gel   Syringe type:  Aspirating dental syringe   Needle gauge:  27 G   Anesthetic injected:  Bupivacaine 0.5% WITH epi   Injection procedure:  Anatomic landmarks identified, introduced needle, incremental injection, negative aspiration for blood and anatomic landmarks palpated Post-procedure details:    Outcome:  Pain improved   Patient tolerance of procedure:  Tolerated well, no immediate complications     Joanne Gavel, PA-C 05/19/18 0811    Ward, Delice Bison, DO 05/20/18 1031

## 2018-05-19 NOTE — ED Triage Notes (Signed)
Right lower dental pain

## 2018-05-19 NOTE — ED Provider Notes (Signed)
TIME SEEN: 4:41 AM  CHIEF COMPLAINT: Dental pain  HPI: Patient is a 53 year old male with history of hypertension, diabetes, hyperlipidemia who presents to the emergency department with dental pain for the past couple of days it has kept him from sleeping tonight.  No fevers, facial swelling, cough, shortness of breath, chest pain.  Does not have a dentist.  ROS: See HPI Constitutional: no fever  Eyes: no drainage  ENT: no runny nose   Cardiovascular:  no chest pain  Resp: no SOB  GI: no vomiting GU: no dysuria Integumentary: no rash  Allergy: no hives  Musculoskeletal: no leg swelling  Neurological: no slurred speech ROS otherwise negative  PAST MEDICAL HISTORY/PAST SURGICAL HISTORY:  Past Medical History:  Diagnosis Date  . Depression   . Diabetes mellitus   . High cholesterol   . Hypertension     MEDICATIONS:  Prior to Admission medications   Medication Sig Start Date End Date Taking? Authorizing Provider  aspirin EC 325 MG EC tablet Take 1 tablet (325 mg total) by mouth daily. 06/28/15   Nita Sells, MD  atorvastatin (LIPITOR) 80 MG tablet Take 1 tablet (80 mg total) by mouth daily at 6 PM. Patient not taking: Reported on 09/26/2016 06/28/15   Nita Sells, MD  citalopram (CELEXA) 20 MG tablet Take 20 mg by mouth daily. 06/21/15   [provider]  FARXIGA 10 MG TABS tablet Take 10 mg by mouth daily. 11/20/15   [provider]  glimepiride (AMARYL) 4 MG tablet Take 4 mg by mouth 2 (two) times daily.    [provider]  ibuprofen (ADVIL,MOTRIN) 600 MG tablet Take 1 tablet (600 mg total) by mouth every 8 (eight) hours as needed for moderate pain. Patient not taking: Reported on 09/26/2016 08/05/16   Aline August, MD  JANUVIA 100 MG tablet Take 100 mg by mouth daily. 07/15/16   [provider]  lisinopril (PRINIVIL,ZESTRIL) 40 MG tablet Take 40 mg by mouth daily.  01/17/15   [provider]  lovastatin (MEVACOR) 40 MG  tablet Take 40 mg by mouth daily. 07/17/16   [provider]  metFORMIN (GLUCOPHAGE) 850 MG tablet Take 1 tablet (850 mg total) by mouth 3 (three) times daily. 07/03/13   Piepenbrink, Anderson Malta, PA-C  metoprolol tartrate (LOPRESSOR) 25 MG tablet Take 1 tablet (25 mg total) by mouth 2 (two) times daily. 06/28/15   Nita Sells, MD  naproxen (NAPROSYN) 500 MG tablet Take 1 tablet (500 mg total) by mouth 2 (two) times daily. 09/25/16   Little, [name], MD  ondansetron (ZOFRAN ODT) 4 MG disintegrating tablet 4mg  ODT q4 hours prn nausea/vomit Patient not taking: Reported on 09/26/2016 02/25/15   Deno Etienne, DO  traMADol (ULTRAM) 50 MG tablet Take 1 tablet (50 mg total) by mouth every 6 (six) hours as needed for moderate pain or severe pain. Patient not taking: Reported on 09/26/2016 08/05/16   Aline August, MD    ALLERGIES:  No Known Allergies  SOCIAL HISTORY:  Social History   Tobacco Use  . Smoking status: Never Smoker  . Smokeless tobacco: Never Used  Substance Use Topics  . Alcohol use: No    FAMILY HISTORY: Family History  Problem Relation Age of Onset  . Hypertension Mother   . Heart failure Father   . Heart attack Father     EXAM: BP (!) 161/96 (BP Location: Left Arm)   Pulse 84   Temp 97.9 F (36.6 C) (Oral)   Resp 17  Ht 6\' 2"  (1.88 m)   Wt 115.2 kg   SpO2 95%   BMI 32.61 kg/m  CONSTITUTIONAL: Alert and oriented and responds appropriately to questions. Well-appearing; well-nourished HEAD: Normocephalic EYES: Conjunctivae clear, pupils appear equal, EOMI ENT: normal nose; moist mucous membranes; No pharyngeal erythema or petechiae, no tonsillar hypertrophy or exudate, no uvular deviation, no unilateral swelling, no trismus or drooling, no muffled voice, normal phonation, no stridor, multiple dental caries present, no drainable dental abscess noted, no Ludwig's angina, he is tender to palpation over the right lower posterior molar with dental decay next  to the gumline, tongue sits flat in the bottom of the mouth, no angioedema, no facial erythema or warmth, no facial swelling; no pain with movement of the neck. NECK: Supple, normal range of motion CARD: Regular rate and rhythm RESP: Normal chest excursion without splinting or tachypnea; no hypoxia or respiratory distress, speaking full sentences ABD/GI: Abdomen is nondistended BACK: Range of motion EXT: Normal ROM in all joints; no major deformities noted SKIN: Normal color for age and race; warm; no rash on exposed skin NEURO: Moves all extremities equally PSYCH: The patient's mood and manner are appropriate. Grooming and personal hygiene are appropriate.  MEDICAL DECISION MAKING: Patient here with dental pain.  Dental pain caused by dental caries.  No obvious drainable abscess and no sign of Ludwig's angina, airway compromise.  Mia McDonald, PA has performed dental block for pain relief.  Please see her note for further details.  Given amoxicillin, ibuprofen here in the emergency department.  He did drive himself here today.  Discharged with prescriptions for amoxicillin, ibuprofen, Vicodin, Zofran.  He has not received a narcotic prescription since 2018.  Given dental resources but we did discuss that he would likely not be able to see a dentist soon given the COVID-19 pandemic so have discussed return precautions at length.  Patient verbalized understanding.  At this time, I do not feel there is any life-threatening condition present. I have reviewed and discussed all results (EKG, imaging, lab, urine as appropriate) and exam findings with patient/family. I have reviewed nursing notes and appropriate previous records.  I feel the patient is safe to be discharged home without further emergent workup and can continue workup as an outpatient as needed. Discussed usual and customary return precautions. Patient/family verbalize understanding and are comfortable with this plan.  Outpatient follow-up has  been provided as needed. All questions have been answered.      Zarif Rathje, Delice Bison, DO 05/19/18 [phone]

## 2018-05-24 DIAGNOSIS — K219 Gastro-esophageal reflux disease without esophagitis: Secondary | ICD-10-CM | POA: Diagnosis not present

## 2018-05-24 DIAGNOSIS — E119 Type 2 diabetes mellitus without complications: Secondary | ICD-10-CM | POA: Diagnosis not present

## 2018-05-24 DIAGNOSIS — K047 Periapical abscess without sinus: Secondary | ICD-10-CM | POA: Diagnosis not present

## 2018-06-07 DIAGNOSIS — K219 Gastro-esophageal reflux disease without esophagitis: Secondary | ICD-10-CM | POA: Diagnosis not present

## 2018-06-07 DIAGNOSIS — K029 Dental caries, unspecified: Secondary | ICD-10-CM | POA: Diagnosis not present

## 2018-06-07 DIAGNOSIS — K0889 Other specified disorders of teeth and supporting structures: Secondary | ICD-10-CM | POA: Diagnosis not present

## 2018-06-07 DIAGNOSIS — E1140 Type 2 diabetes mellitus with diabetic neuropathy, unspecified: Secondary | ICD-10-CM | POA: Diagnosis not present

## 2018-06-07 DIAGNOSIS — E119 Type 2 diabetes mellitus without complications: Secondary | ICD-10-CM | POA: Diagnosis not present

## 2018-10-31 ENCOUNTER — Emergency Department (HOSPITAL_COMMUNITY)
Admission: EM | Admit: 2018-10-31 | Discharge: 2018-11-01 | Disposition: A | Discharge status: Home / Self Care | Payer: Commercial Managed Care - PPO | Attending: Emergency Medicine | Admitting: Emergency Medicine

## 2018-10-31 ENCOUNTER — Encounter (HOSPITAL_COMMUNITY): Payer: Self-pay | Admitting: Emergency Medicine

## 2018-10-31 ENCOUNTER — Other Ambulatory Visit: Payer: Self-pay

## 2018-10-31 ENCOUNTER — Emergency Department (HOSPITAL_COMMUNITY): Payer: Commercial Managed Care - PPO

## 2018-10-31 DIAGNOSIS — R079 Chest pain, unspecified: Secondary | ICD-10-CM | POA: Diagnosis present

## 2018-10-31 DIAGNOSIS — R0781 Pleurodynia: Secondary | ICD-10-CM | POA: Diagnosis not present

## 2018-10-31 DIAGNOSIS — I10 Essential (primary) hypertension: Secondary | ICD-10-CM | POA: Insufficient documentation

## 2018-10-31 DIAGNOSIS — U071 COVID-19: Secondary | ICD-10-CM

## 2018-10-31 DIAGNOSIS — Z7984 Long term (current) use of oral hypoglycemic drugs: Secondary | ICD-10-CM | POA: Diagnosis not present

## 2018-10-31 DIAGNOSIS — Z955 Presence of coronary angioplasty implant and graft: Secondary | ICD-10-CM | POA: Insufficient documentation

## 2018-10-31 DIAGNOSIS — R42 Dizziness and giddiness: Secondary | ICD-10-CM | POA: Diagnosis not present

## 2018-10-31 DIAGNOSIS — Z79899 Other long term (current) drug therapy: Secondary | ICD-10-CM | POA: Diagnosis not present

## 2018-10-31 DIAGNOSIS — R0602 Shortness of breath: Secondary | ICD-10-CM | POA: Diagnosis not present

## 2018-10-31 DIAGNOSIS — R1010 Upper abdominal pain, unspecified: Secondary | ICD-10-CM | POA: Insufficient documentation

## 2018-10-31 DIAGNOSIS — R1011 Right upper quadrant pain: Secondary | ICD-10-CM | POA: Insufficient documentation

## 2018-10-31 DIAGNOSIS — E119 Type 2 diabetes mellitus without complications: Secondary | ICD-10-CM | POA: Insufficient documentation

## 2018-10-31 DIAGNOSIS — Z7982 Long term (current) use of aspirin: Secondary | ICD-10-CM | POA: Insufficient documentation

## 2018-10-31 DIAGNOSIS — I2510 Atherosclerotic heart disease of native coronary artery without angina pectoris: Secondary | ICD-10-CM | POA: Diagnosis not present

## 2018-10-31 LAB — BASIC METABOLIC PANEL
Anion gap: 9 (ref 5–15)
BUN: 20 mg/dL (ref 6–20)
CO2: 19 mmol/L — ABNORMAL LOW (ref 22–32)
Calcium: 8.9 mg/dL (ref 8.9–10.3)
Chloride: 108 mmol/L (ref 98–111)
Creatinine, Ser: 1.12 mg/dL (ref 0.61–1.24)
GFR calc Af Amer: >60 mL/min (ref >60)
GFR calc non Af Amer: >60 mL/min (ref >60)
Glucose, Bld: 152 mg/dL — ABNORMAL HIGH (ref 70–99)
Potassium: 3.6 mmol/L (ref 3.5–5.1)
Sodium: 136 mmol/L (ref 135–145)

## 2018-10-31 LAB — CBC
HCT: 47.5 % (ref 39.0–52.0)
Hemoglobin: 15.6 g/dL (ref 13.0–17.0)
MCH: 29.2 pg (ref 26.0–34.0)
MCHC: 32.8 g/dL (ref 30.0–36.0)
MCV: 89 fL (ref 80.0–100.0)
Platelets: 150 10*3/uL (ref 150–400)
RBC: 5.34 MIL/uL (ref 4.22–5.81)
RDW: 13.8 % (ref 11.5–15.5)
WBC: 3 10*3/uL — ABNORMAL LOW (ref 4.0–10.5)
nRBC: 0 % (ref 0.0–0.2)

## 2018-10-31 LAB — TROPONIN I (HIGH SENSITIVITY): Troponin I (High Sensitivity): 8 ng/L (ref <18)

## 2018-10-31 MED ORDER — SODIUM CHLORIDE 0.9 % IV BOLUS
500.0000 mL | Freq: Once | INTRAVENOUS | Status: AC
  Administered 2018-11-01: 500 mL via INTRAVENOUS

## 2018-10-31 MED ORDER — MORPHINE SULFATE (PF) 4 MG/ML IV SOLN
4.0000 mg | Freq: Once | INTRAVENOUS | Status: AC
  Administered 2018-11-01: 4 mg via INTRAVENOUS
  Filled 2018-10-31: qty 1

## 2018-10-31 MED ORDER — ONDANSETRON HCL 4 MG/2ML IJ SOLN
4.0000 mg | Freq: Once | INTRAMUSCULAR | Status: AC
  Administered 2018-11-01: 4 mg via INTRAVENOUS
  Filled 2018-10-31: qty 2

## 2018-10-31 NOTE — ED Triage Notes (Signed)
Pt c/o intermittent left chest pains for 2 days  With dizziness

## 2018-10-31 NOTE — ED Provider Notes (Signed)
[facility] DEPT Provider Note   CSN: [id number] Arrival date & time: 10/31/18  1834     History   Chief Complaint Chief Complaint  Patient presents with   Chest Pain    HPI [name] is a 53 y.o. male.     53 year old male CAD s/p CABG, HTN, and diabetes presents to the emergency department for evaluation of chest pain.  He states that pain has been in his central chest for the past 2 days.  Pain was initially intermittent, but has been more constant since this AM.  Also present in his upper abdomen without radiation to jaw, neck, shoulders.  Reports worsening symptoms with breathing as well as associated dizziness and paresthesias in his L middle finger.  Does state that symptoms are slightly aggravated with exertion.  They are not specifically postprandial, though he has had decreased appetite.  He has not had any known fevers, syncope, hemoptysis, leg swelling.  No history of abdominal surgeries.  Continues to be compliant with his daily medications.    The history is provided by the patient. No language interpreter was used.  Chest Pain   Past Medical History:  Diagnosis Date   Depression    Diabetes mellitus    High cholesterol    Hypertension     Patient Active Problem List   Diagnosis Date Noted   CAD (coronary artery disease) 08/04/2016   Vertigo 08/04/2016   Chest pain 06/25/2015   Diabetes mellitus 06/25/2015   High cholesterol 06/25/2015   Hypertension 06/25/2015   Pain in the chest    Type 2 diabetes mellitus without complication, without long-term current use of insulin [facility])     Past Surgical History:  Procedure Laterality Date   CARDIAC CATHETERIZATION  2010   Dr Terrence Dupont, D1 20-25%, CFX 20-25%, RCA 10%, EF 50-55%   CARDIAC CATHETERIZATION N/A 06/27/2015   Procedure: Left Heart Cath and Coronary Angiography;  Surgeon: Sherren Mocha, MD;  Location: [facility];  Service: Cardiovascular;  Laterality:  N/A;   CARDIAC CATHETERIZATION N/A 06/27/2015   Procedure: Intravascular Pressure Wire/FFR Study;  Surgeon: Sherren Mocha, MD;  Location: [facility] [facility];  Service: Cardiovascular;  Laterality: N/A;   CORONARY ANGIOPLASTY WITH STENT PLACEMENT  2008   per pt, at [facility], not seen on 2010 cath   [facility]     right knee        Home Medications    Prior to Admission medications   Medication Sig Start Date End Date Taking? Authorizing Provider  aspirin EC 325 MG EC tablet Take 1 tablet (325 mg total) by mouth daily. 06/28/15  Yes Nita Sells, MD  atorvastatin (LIPITOR) 80 MG tablet Take 1 tablet (80 mg total) by mouth daily at 6 PM. 06/28/15  Yes Nita Sells, MD  citalopram (CELEXA) 20 MG tablet Take 20 mg by mouth daily. 06/21/15  Yes [provider]  FARXIGA 10 MG TABS tablet Take 10 mg by mouth daily. 11/20/15  Yes [provider]  glimepiride (AMARYL) 4 MG tablet Take 4 mg by mouth 2 (two) times daily.   Yes [provider]  JANUVIA 100 MG tablet Take 100 mg by mouth daily. 07/15/16  Yes [provider]  lisinopril (PRINIVIL,ZESTRIL) 40 MG tablet Take 40 mg by mouth daily.  01/17/15  Yes [provider]  lovastatin (MEVACOR) 40 MG tablet Take 40 mg by mouth daily. 07/17/16  Yes [provider]  metFORMIN (GLUCOPHAGE) 850 MG tablet Take 1 tablet (850 mg total) by mouth 3 (three) times daily. 07/03/13  Yes Piepenbrink, Anderson Malta, PA-C  metoprolol tartrate (LOPRESSOR) 25 MG tablet Take 1 tablet (25 mg total) by mouth 2 (two) times daily. 06/28/15  Yes Nita Sells, MD  amoxicillin (AMOXIL) 500 MG capsule Take 1 capsule (500 mg total) by mouth 3 (three) times daily. Patient not taking: Reported on 11/01/2018 05/19/18   Ward, Delice Bison, DO  HYDROcodone-acetaminophen (NORCO/VICODIN) 5-325 MG tablet Take 2 tablets by mouth every 6 (six) hours as needed. Patient not taking: Reported on 11/01/2018  05/19/18   Ward, Delice Bison, DO  ibuprofen (ADVIL) 800 MG tablet Take 1 tablet (800 mg total) by mouth every 8 (eight) hours as needed for mild pain. Patient not taking: Reported on 11/01/2018 05/19/18   Ward, Delice Bison, DO  ondansetron (ZOFRAN ODT) 4 MG disintegrating tablet Take 1 tablet (4 mg total) by mouth every 6 (six) hours as needed for nausea or vomiting. Patient not taking: Reported on 11/01/2018 05/19/18   Ward, Delice Bison, DO    Family History Family History  Problem Relation Age of Onset   Hypertension Mother    Heart failure Father    Heart attack Father     Social History Social History   Tobacco Use   Smoking status: Never Smoker   Smokeless tobacco: Never Used  Substance Use Topics   Alcohol use: No   Drug use: No     Allergies   Patient has no known allergies.   Review of Systems Review of Systems  Cardiovascular: Positive for chest pain.  Ten systems reviewed and are negative for acute change, except as noted in the HPI.     Physical Exam Updated Vital Signs BP (!) 150/93    Pulse 78    Temp 99.2 F (37.3 C) (Oral)    Resp (!) 24    SpO2 98%   Physical Exam Vitals signs and nursing note reviewed.  Constitutional:      General: He is not in acute distress.    Appearance: He is well-developed. He is not diaphoretic.     Comments: Patient appears uncomfortable, but nontoxic  HENT:     Head: Normocephalic and atraumatic.  Eyes:     General: No scleral icterus.    Conjunctiva/sclera: Conjunctivae normal.  Neck:     Musculoskeletal: Normal range of motion.  Cardiovascular:     Rate and Rhythm: Normal rate and regular rhythm.     Pulses: Normal pulses.  Pulmonary:     Effort: Pulmonary effort is normal. No respiratory distress.     Breath sounds: No stridor. No wheezing or rales.     Comments: Lungs grossly CTAB. Respirations even and unlabored. Abdominal:     Palpations: Abdomen is soft. There is no mass.     Tenderness: There is abdominal  tenderness (epigastrium and RUQ).     Comments: Obese abdomen without peritoneal signs.  Musculoskeletal: Normal range of motion.  Skin:    General: Skin is warm and dry.     Coloration: Skin is not pale.     Findings: No erythema or rash.  Neurological:     General: No focal deficit present.     Mental Status: He is alert and oriented to person, place, and time.     Coordination: Coordination normal.  Psychiatric:        Behavior: Behavior normal.      ED Treatments / Results  Labs (  all labs ordered are listed, but only abnormal results are displayed) Labs Reviewed  SARS CORONAVIRUS 2 ([facility]) - Abnormal; Notable for the following components:      Result Value   SARS Coronavirus 2 POSITIVE (*)    All other components within normal limits  BASIC METABOLIC PANEL - Abnormal; Notable for the following components:   CO2 19 (*)    Glucose, Bld 152 (*)    All other components within normal limits  CBC - Abnormal; Notable for the following components:   WBC 3.0 (*)    All other components within normal limits  HEPATIC FUNCTION PANEL - Abnormal; Notable for the following components:   Indirect Bilirubin 0.2 (*)    All other components within normal limits  LIPASE, BLOOD  TROPONIN I (HIGH SENSITIVITY)  TROPONIN I (HIGH SENSITIVITY)    EKG EKG Interpretation  Date/Time:  Tuesday October 31 2018 18:52:03 EDT Ventricular Rate:  89 PR Interval:    QRS Duration: 88 QT Interval:  349 QTC Calculation: 425 R Axis:   -24 Text Interpretation:  Sinus rhythm Left ventricular hypertrophy No STEMI  ST elevations V2-V3 within normal limits for gender and age  Confirmed by Octaviano Glow [phone]) on 10/31/2018 6:59:31 PM   Radiology Dg Chest 2 View  Result Date: 10/31/2018 CLINICAL DATA:  Chest pain. EXAM: CHEST - 2 VIEW COMPARISON:  Radiograph August 03, 2016. FINDINGS: The heart size and mediastinal contours are within normal limits. Both  lungs are clear. No pneumothorax or pleural effusion is noted. The visualized skeletal structures are unremarkable. IMPRESSION: No active cardiopulmonary disease. Electronically Signed   By: Marijo Conception M.D.   On: 10/31/2018 19:16   US Abdomen Limited  Result Date: 11/01/2018 CLINICAL DATA:  Right upper quadrant pain for 2 days. Tenderness to palpation. EXAM: ULTRASOUND ABDOMEN LIMITED RIGHT UPPER QUADRANT COMPARISON:  None. FINDINGS: Gallbladder: Physiologically distended. No gallstones or wall thickening visualized. No sonographic Murphy sign noted by sonographer. Common bile duct: Diameter: 3 mm, normal. Liver: No focal lesion identified. Heterogeneous in parenchymal echogenicity. Portal vein is patent on color Doppler imaging with normal direction of blood flow towards the liver. Other: None. IMPRESSION: Unremarkable sonographic appearance of the liver and biliary tree. No gallstones. Electronically Signed   By: Keith Rake M.D.   On: 11/01/2018 00:27   Ct Angio Chest/abd/pel For Dissection W And/or Wo Contrast  Result Date: 11/01/2018 CLINICAL DATA:  Chest and back pain with acute aortic dissection suspected EXAM: CT ANGIOGRAPHY CHEST, ABDOMEN AND PELVIS TECHNIQUE: Multidetector CT imaging through the chest, abdomen and pelvis was performed using the standard protocol during bolus administration of intravenous contrast. Multiplanar reconstructed images and MIPs were obtained and reviewed to evaluate the vascular anatomy. CONTRAST:  155mL OMNIPAQUE IOHEXOL 350 MG/ML SOLN COMPARISON:  CT of the abdomen and pelvis 07/16/2010 FINDINGS: CTA CHEST FINDINGS Cardiovascular: Normal heart size. No pericardial effusion. No atherosclerotic calcification. No evidence of intramural hematoma. No aortic aneurysm or dissection. Widely patent great vessels. No visible pulmonary artery filling defect Mediastinum/Nodes: Negative for adenopathy or mass Lungs/Pleura: Patchy consolidative and ground-glass opacities  in the bilateral lungs without cavitation. No Kerley lines, effusion, or pneumothorax. Musculoskeletal: No acute or aggressive finding Review of the MIP images confirms the above findings. CTA ABDOMEN AND PELVIS FINDINGS VASCULAR Aorta: Normal Celiac: Normal SMA: Normal Renals: Normal IMA: Patent Inflow: Normal Veins: Unremarkable in the arterial phase Review of the MIP images confirms the above findings. NON-VASCULAR  Hepatobiliary: No focal liver abnormality.No evidence of biliary obstruction or stone. Pancreas: Unremarkable. Spleen: Unremarkable. Adrenals/Urinary Tract: Negative adrenals. No hydronephrosis or stone. Unremarkable bladder. Stomach/Bowel:  No obstruction. No appendicitis. Lymphatic: No mass or adenopathy. Reproductive:No pathologic findings. Other: No ascites or pneumoperitoneum. Musculoskeletal: No acute abnormalities. These results were called by telephone at the time of interpretation on 11/01/2018 at 4:25 am to provider [facility] , who verbally acknowledged these results. Review of the MIP images confirms the above findings. IMPRESSION: 1. Multifocal pneumonia, potentially COVID-19 associated. 2. Normal CTA of the aorta. Electronically Signed   By: Monte Fantasia M.D.   On: 11/01/2018 04:28    Procedures Procedures (including critical care time)  Left Heart Cath and Coronary Angiography Conclusion - 06/2015  Ost RCA lesion, 25% stenosed.  Prox LAD lesion, 50% stenosed.  Ost Ramus lesion, 30% stenosed.  Mid LAD lesion, 50% stenosed.   1. Moderate LAD stenosis with negative FFR evaluation 2. Mild nonobstructive LCx and RCA stenosis  Recommend: medical therapy for risk reduction and treatment of nonobstructive CAD     Medications Ordered in ED Medications  sodium chloride (PF) 0.9 % injection (has no administration in time range)  morphine 4 MG/ML injection 4 mg (4 mg Intravenous Given 11/01/18 0026)  ondansetron (ZOFRAN) injection 4 mg (4 mg Intravenous Given 11/01/18  0026)  sodium chloride 0.9 % bolus 500 mL (0 mLs Intravenous Stopped 11/01/18 0250)  HYDROmorphone (DILAUDID) injection 1 mg (1 mg Intravenous Given 11/01/18 0302)  iohexol (OMNIPAQUE) 350 MG/ML injection 100 mL (100 mLs Intravenous Contrast Given 11/01/18 0356)      Initial Impression / Assessment and Plan / ED Course  I have reviewed the triage vital signs and the nursing notes.  Pertinent labs & imaging results that were available during my care of the patient were reviewed by me and considered in my medical decision making (see chart for details).        48:79 PM 53 year old male presenting to the emergency department for complaints of 2 days of chest pain.  Chest pain is pleuritic and present also in the epigastrium.  States that pain initially was intermittent, but has been more constant today.  He has not had any syncope, hemoptysis, leg swelling, vomiting.  Noted to have some reproducible tenderness in the epigastrium and right upper quadrant.  Will add on liver tests as well as abdominal ultrasound.  Pending delta troponin.  Initial cardiac work-up reassuring.  2:30 AM Patient reassessed.  He continues to complain of pain.  Reports pain with breathing, though he also continues to have reproducible tenderness to his abdomen.  Delta troponin is stable, negative.  On repeat abdominal exam, tenderness is fairly generalized.  He has no peritoneal signs.  Initially reported some paresthesias in his left middle finger, now expanding that he has paresthesias in his bilateral feet.  Given history and risk factors, will obtain CTA dissection study.  Additional medications ordered for pain.  4:30 AM Received call from MD Pascal Lux of radiology regarding patient's CT scan.  Aorta appears intact, though patient has evidence of multifocal pneumonia; possibly COVID pattern.  This does correlate with his pleuritic symptoms.  May also correlate with his leukopenia.  An attempt to differentiate between  coronavirus and pneumonia, will send rapid COVID swab.  MD Pollina to assess.  6:45 AM COVID test today is positive. Vitals have been stable without hypoxia. Patient does not clinically appear unwell.  He is more comfortable after receiving an additional dose of  pain medication.  I do not feel he currently necessitates inpatient management.  Will discharge home with instructions to rest, orally hydrate, and continue Tylenol for any low-grade fever as well as ibuprofen for body aches.  Patient instructed of the need to quarantine for the next 14 days.  Return precautions discussed and provided. Patient discharged in stable condition with no unaddressed concerns.   Vitals:   10/31/18 1850 11/01/18 0025  BP: (!) 138/92 (!) 150/93  Pulse: 89 78  Resp: 18 (!) 24  Temp: 99.2 F (37.3 C)   TempSrc: Oral   SpO2: 95% 98%    [name] was evaluated in Emergency Department on 11/01/2018 for the symptoms described in the history of present illness. He was evaluated in the context of the global COVID-19 pandemic, which necessitated consideration that the patient might be at risk for infection with the SARS-CoV-2 virus that causes COVID-19. Institutional protocols and algorithms that pertain to the evaluation of patients at risk for COVID-19 are in a state of rapid change based on information released by regulatory bodies including the CDC and federal and state organizations. These policies and algorithms were followed during the patient's care in the ED.   Final Clinical Impressions(s) / ED Diagnoses   Final diagnoses:  [id number] virus detected  Pleuritic chest pain    ED Discharge Orders    None       Antonietta Breach, PA-C 11/01/18 [id number]    Orpah Greek, MD 11/01/18 [phone]

## 2018-11-01 ENCOUNTER — Emergency Department (HOSPITAL_COMMUNITY): Payer: Commercial Managed Care - PPO

## 2018-11-01 LAB — LIPASE, BLOOD: Lipase: 42 U/L (ref 11–51)

## 2018-11-01 LAB — HEPATIC FUNCTION PANEL
ALT: 20 U/L (ref 0–44)
AST: 21 U/L (ref 15–41)
Albumin: 3.8 g/dL (ref 3.5–5.0)
Alkaline Phosphatase: 50 U/L (ref 38–126)
Bilirubin, Direct: 0.1 mg/dL (ref 0.0–0.2)
Indirect Bilirubin: 0.2 mg/dL — ABNORMAL LOW (ref 0.3–0.9)
Total Bilirubin: 0.3 mg/dL (ref 0.3–1.2)
Total Protein: 7.2 g/dL (ref 6.5–8.1)

## 2018-11-01 LAB — SARS CORONAVIRUS 2 BY RT PCR (HOSPITAL ORDER, PERFORMED IN ~~LOC~~ HOSPITAL LAB): SARS Coronavirus 2: POSITIVE — AB

## 2018-11-01 LAB — TROPONIN I (HIGH SENSITIVITY): Troponin I (High Sensitivity): 7 ng/L (ref <18)

## 2018-11-01 MED ORDER — ALUM & MAG HYDROXIDE-SIMETH 200-200-20 MG/5ML PO SUSP: 30.0000 mL | Freq: Once | ORAL | Status: DC

## 2018-11-01 MED ORDER — HYDROMORPHONE HCL 1 MG/ML IJ SOLN
1.0000 mg | Freq: Once | INTRAMUSCULAR | Status: AC
  Administered 2018-11-01: 1 mg via INTRAVENOUS
  Filled 2018-11-01: qty 1

## 2018-11-01 MED ORDER — SODIUM CHLORIDE (PF) 0.9 % IJ SOLN
INTRAMUSCULAR | Status: AC
  Filled 2018-11-01: qty 50

## 2018-11-01 MED ORDER — IOHEXOL 350 MG/ML SOLN
100.0000 mL | Freq: Once | INTRAVENOUS | Status: AC | PRN
  Administered 2018-11-01: 100 mL via INTRAVENOUS

## 2018-11-01 MED ORDER — LIDOCAINE VISCOUS HCL 2 % MT SOLN: 15.0000 mL | Freq: Once | OROMUCOSAL | Status: DC

## 2018-11-01 NOTE — ED Notes (Signed)
Date and time results received: 11/01/18 6:32 AM  (use smartphrase ".now" to insert current time)  Test: COVID-19 Critical Value: Positive  Name of Provider Notified: Fredderick Phenix. PA  Orders Received? Or Actions Taken?:

## 2018-11-01 NOTE — Discharge Instructions (Signed)
You were diagnosed with coronavirus while in the emergency department.  This is the cause of your chest pain which is aggravated while breathing.  Your oxygen level and other vital signs have been very reassuring while in the emergency department.  You should quarantine away from all other individuals for the next 14 days.  Use Tylenol for any low-grade fever or ibuprofen for pain or body aches.  You may also use other over-the-counter medications for symptom control.  Drink plenty of fluids to prevent dehydration.  Should you experience any worsening symptoms such as difficulty breathing, loss of consciousness, increased confusion or lethargy, promptly return to the ED for evaluation.

## 2018-11-01 NOTE — ED Notes (Signed)
Patient transported to CT 

## 2020-03-06 ENCOUNTER — Emergency Department (HOSPITAL_COMMUNITY): Payer: Commercial Managed Care - PPO

## 2020-03-06 ENCOUNTER — Other Ambulatory Visit: Payer: Self-pay

## 2020-03-06 ENCOUNTER — Encounter (HOSPITAL_COMMUNITY): Payer: Self-pay | Admitting: Emergency Medicine

## 2020-03-06 ENCOUNTER — Observation Stay (HOSPITAL_COMMUNITY)
Admission: EM | Admit: 2020-03-06 | Discharge: 2020-03-07 | Disposition: A | Discharge status: Home / Self Care | Payer: Commercial Managed Care - PPO | Attending: Family Medicine | Admitting: Family Medicine

## 2020-03-06 DIAGNOSIS — R739 Hyperglycemia, unspecified: Secondary | ICD-10-CM | POA: Diagnosis not present

## 2020-03-06 DIAGNOSIS — Z7984 Long term (current) use of oral hypoglycemic drugs: Secondary | ICD-10-CM | POA: Diagnosis not present

## 2020-03-06 DIAGNOSIS — N179 Acute kidney failure, unspecified: Secondary | ICD-10-CM | POA: Diagnosis not present

## 2020-03-06 DIAGNOSIS — Z7982 Long term (current) use of aspirin: Secondary | ICD-10-CM | POA: Diagnosis not present

## 2020-03-06 DIAGNOSIS — E785 Hyperlipidemia, unspecified: Secondary | ICD-10-CM | POA: Diagnosis not present

## 2020-03-06 DIAGNOSIS — I2510 Atherosclerotic heart disease of native coronary artery without angina pectoris: Secondary | ICD-10-CM | POA: Insufficient documentation

## 2020-03-06 DIAGNOSIS — I10 Essential (primary) hypertension: Secondary | ICD-10-CM | POA: Insufficient documentation

## 2020-03-06 DIAGNOSIS — Z79899 Other long term (current) drug therapy: Secondary | ICD-10-CM | POA: Insufficient documentation

## 2020-03-06 DIAGNOSIS — R079 Chest pain, unspecified: Secondary | ICD-10-CM | POA: Diagnosis not present

## 2020-03-06 DIAGNOSIS — Z20822 Contact with and (suspected) exposure to covid-19: Secondary | ICD-10-CM | POA: Diagnosis not present

## 2020-03-06 DIAGNOSIS — K219 Gastro-esophageal reflux disease without esophagitis: Secondary | ICD-10-CM | POA: Insufficient documentation

## 2020-03-06 DIAGNOSIS — E1165 Type 2 diabetes mellitus with hyperglycemia: Secondary | ICD-10-CM | POA: Insufficient documentation

## 2020-03-06 DIAGNOSIS — R0789 Other chest pain: Secondary | ICD-10-CM | POA: Diagnosis not present

## 2020-03-06 LAB — HEMOGLOBIN A1C
Hgb A1c MFr Bld: 13.8 % — ABNORMAL HIGH (ref 4.8–5.6)
Mean Plasma Glucose: 349.36 mg/dL

## 2020-03-06 LAB — LIPASE, BLOOD: Lipase: 53 U/L — ABNORMAL HIGH (ref 11–51)

## 2020-03-06 LAB — CBC
HCT: 40.1 % (ref 39.0–52.0)
Hemoglobin: 13.6 g/dL (ref 13.0–17.0)
MCH: 30.7 pg (ref 26.0–34.0)
MCHC: 33.9 g/dL (ref 30.0–36.0)
MCV: 90.5 fL (ref 80.0–100.0)
Platelets: 222 10*3/uL (ref 150–400)
RBC: 4.43 MIL/uL (ref 4.22–5.81)
RDW: 13.3 % (ref 11.5–15.5)
WBC: 7.3 10*3/uL (ref 4.0–10.5)
nRBC: 0 % (ref 0.0–0.2)

## 2020-03-06 LAB — URINALYSIS, ROUTINE W REFLEX MICROSCOPIC
Bacteria, UA: NONE SEEN
Bilirubin Urine: NEGATIVE
Glucose, UA: >=500 mg/dL — AB
Hgb urine dipstick: NEGATIVE
Ketones, ur: NEGATIVE mg/dL
Leukocytes,Ua: NEGATIVE
Nitrite: NEGATIVE
Protein, ur: NEGATIVE mg/dL
Specific Gravity, Urine: 1.02 (ref 1.005–1.030)
pH: 5 (ref 5.0–8.0)

## 2020-03-06 LAB — GLUCOSE, CAPILLARY: Glucose-Capillary: 223 mg/dL — ABNORMAL HIGH (ref 70–99)

## 2020-03-06 LAB — BASIC METABOLIC PANEL
Anion gap: 13 (ref 5–15)
BUN: 34 mg/dL — ABNORMAL HIGH (ref 6–20)
CO2: 18 mmol/L — ABNORMAL LOW (ref 22–32)
Calcium: 9 mg/dL (ref 8.9–10.3)
Chloride: 100 mmol/L (ref 98–111)
Creatinine, Ser: 1.89 mg/dL — ABNORMAL HIGH (ref 0.61–1.24)
GFR, Estimated: 41 mL/min — ABNORMAL LOW (ref >60)
Glucose, Bld: 453 mg/dL — ABNORMAL HIGH (ref 70–99)
Potassium: 4.2 mmol/L (ref 3.5–5.1)
Sodium: 131 mmol/L — ABNORMAL LOW (ref 135–145)

## 2020-03-06 LAB — HEPATIC FUNCTION PANEL
ALT: 25 U/L (ref 0–44)
AST: 21 U/L (ref 15–41)
Albumin: 3 g/dL — ABNORMAL LOW (ref 3.5–5.0)
Alkaline Phosphatase: 49 U/L (ref 38–126)
Bilirubin, Direct: 0.2 mg/dL (ref 0.0–0.2)
Indirect Bilirubin: 0.5 mg/dL (ref 0.3–0.9)
Total Bilirubin: 0.7 mg/dL (ref 0.3–1.2)
Total Protein: 5.7 g/dL — ABNORMAL LOW (ref 6.5–8.1)

## 2020-03-06 LAB — TROPONIN I (HIGH SENSITIVITY)
Troponin I (High Sensitivity): 6 ng/L (ref <18)
Troponin I (High Sensitivity): 5 ng/L (ref <18)
Troponin I (High Sensitivity): 6 ng/L (ref <18)
Troponin I (High Sensitivity): 6 ng/L (ref <18)

## 2020-03-06 LAB — CBG MONITORING, ED: Glucose-Capillary: 380 mg/dL — ABNORMAL HIGH (ref 70–99)

## 2020-03-06 LAB — HIV ANTIBODY (ROUTINE TESTING W REFLEX): HIV Screen 4th Generation wRfx: NONREACTIVE

## 2020-03-06 LAB — SARS CORONAVIRUS 2 BY RT PCR (HOSPITAL ORDER, PERFORMED IN ~~LOC~~ HOSPITAL LAB): SARS Coronavirus 2: NEGATIVE

## 2020-03-06 LAB — D-DIMER, QUANTITATIVE: D-Dimer, Quant: 0.38 ug/mL-FEU (ref 0.00–0.50)

## 2020-03-06 MED ORDER — ENOXAPARIN SODIUM 40 MG/0.4ML ~~LOC~~ SOLN
40.0000 mg | SUBCUTANEOUS | Status: DC
  Administered 2020-03-06: 40 mg via SUBCUTANEOUS
  Filled 2020-03-06: qty 0.4

## 2020-03-06 MED ORDER — ONDANSETRON HCL 4 MG/2ML IJ SOLN: 4.0000 mg | Freq: Four times a day (QID) | INTRAMUSCULAR | Status: DC | PRN

## 2020-03-06 MED ORDER — SODIUM CHLORIDE 0.9 % IV BOLUS
1000.0000 mL | Freq: Once | INTRAVENOUS | Status: AC
  Administered 2020-03-06: 1000 mL via INTRAVENOUS

## 2020-03-06 MED ORDER — MORPHINE SULFATE (PF) 2 MG/ML IV SOLN
2.0000 mg | INTRAVENOUS | Status: DC | PRN
  Administered 2020-03-06 – 2020-03-07 (×3): 2 mg via INTRAVENOUS
  Filled 2020-03-06 (×3): qty 1

## 2020-03-06 MED ORDER — MAGNESIUM HYDROXIDE 400 MG/5ML PO SUSP: 15.0000 mL | Freq: Every day | ORAL | Status: DC | PRN

## 2020-03-06 MED ORDER — MORPHINE SULFATE (PF) 4 MG/ML IV SOLN
4.0000 mg | Freq: Once | INTRAVENOUS | Status: AC
  Administered 2020-03-06: 4 mg via INTRAVENOUS
  Filled 2020-03-06: qty 1

## 2020-03-06 MED ORDER — ACETAMINOPHEN 325 MG PO TABS: 650.0000 mg | ORAL_TABLET | ORAL | Status: DC | PRN

## 2020-03-06 MED ORDER — MORPHINE SULFATE (PF) 2 MG/ML IV SOLN
2.0000 mg | Freq: Once | INTRAVENOUS | Status: AC
  Administered 2020-03-06: 2 mg via INTRAVENOUS
  Filled 2020-03-06: qty 1

## 2020-03-06 MED ORDER — METOPROLOL TARTRATE 25 MG PO TABS
25.0000 mg | ORAL_TABLET | Freq: Two times a day (BID) | ORAL | Status: DC
  Administered 2020-03-06 – 2020-03-07 (×2): 25 mg via ORAL
  Filled 2020-03-06 (×2): qty 1

## 2020-03-06 MED ORDER — FENTANYL CITRATE (PF) 100 MCG/2ML IJ SOLN
50.0000 ug | Freq: Once | INTRAMUSCULAR | Status: AC
  Administered 2020-03-06: 50 ug via INTRAVENOUS
  Filled 2020-03-06: qty 2

## 2020-03-06 MED ORDER — INSULIN ASPART 100 UNIT/ML ~~LOC~~ SOLN
0.0000 [IU] | Freq: Three times a day (TID) | SUBCUTANEOUS | Status: DC
  Administered 2020-03-07 (×2): 3 [IU] via SUBCUTANEOUS

## 2020-03-06 MED ORDER — PANTOPRAZOLE SODIUM 40 MG PO TBEC
40.0000 mg | DELAYED_RELEASE_TABLET | Freq: Every day | ORAL | Status: DC
  Administered 2020-03-07: 40 mg via ORAL
  Filled 2020-03-06: qty 1

## 2020-03-06 MED ORDER — ALUM & MAG HYDROXIDE-SIMETH 200-200-20 MG/5ML PO SUSP: 15.0000 mL | ORAL | Status: DC | PRN

## 2020-03-06 MED ORDER — ATORVASTATIN CALCIUM 80 MG PO TABS
80.0000 mg | ORAL_TABLET | Freq: Every day | ORAL | Status: DC
  Administered 2020-03-06: 80 mg via ORAL
  Filled 2020-03-06: qty 2

## 2020-03-06 MED ORDER — ONDANSETRON HCL 4 MG/2ML IJ SOLN
4.0000 mg | Freq: Once | INTRAMUSCULAR | Status: AC
  Administered 2020-03-06: 4 mg via INTRAVENOUS
  Filled 2020-03-06: qty 2

## 2020-03-06 MED ORDER — LACTATED RINGERS IV SOLN: INTRAVENOUS | Status: DC

## 2020-03-06 MED ORDER — LISINOPRIL 20 MG PO TABS: 40.0000 mg | ORAL_TABLET | Freq: Every day | ORAL | Status: DC

## 2020-03-06 NOTE — Progress Notes (Signed)
FPTS Interim Progress Note  S: Patient continues to have intermittent substernal chest pain that is described as "pulling" and radiates to his back. Rated 8/10. Denies nausea, shortness of breath, diaphoresis and UE numbness, tingling or weakness. Reports no sx change with eating. Has had the pain at rest earlier tonight. Reports the pain medication helps.   O: BP 112/63   Pulse 76   Temp 97.7 F (36.5 C) (Oral)   Resp 14   Ht 6\' 2"  (1.88 m)   Wt 108.4 kg   SpO2 98%   BMI 30.69 kg/m    General: resting in bed, appears uncomfortable  CVS: regular rate and rhythm, no murmurs,  NSR on monitor  RESP: clear lungs, no increased work of breathing, on RA     A/P: Chest pain: VSS. Recent troponin's flat (6,6).  Tele reviewed. Plan to consult cardiology in AM, earlier if needed.  See H&P for full plan   Lyndee Hensen, DO 03/06/2020, 11:20 PM PGY-2, [facility] pager [phone]

## 2020-03-06 NOTE — Progress Notes (Signed)
S) [name] arrived at his room at around 6:45pm.  I was paged because he began to experience significant 8/10 chest pain during his transport to the room.  Upon my arrival, he noted that his chest pain felt similar to earlier in the day though slightly less severe.  He was not exerting himself or moving around significantly when this most recent episode of chest pain started.  He continued to experience moderate chest pain during our conversation as we were getting set up for an EKG.  O) BP 112/63   Pulse 67   Temp 97.7 F (36.5 C) (Oral)   Resp 14   Ht 6\' 2"  (1.88 m)   Wt 108.4 kg   SpO2 98%   BMI 30.69 kg/m   General: Lying in bed in mild distress.  Able to converse comfortably without issue. Respiratory: Breathing comfortably on room air.  EKG: No new Q waves or T wave inversions.  He continues to demonstrate the ST elevations in V1 through V4 which have been present on EKGs earlier today in addition to his prior EKGs from a year ago.  Overall, no new changes.  A/P) [name] is a 56 year old gentleman admitted for further assessment of acute coronary syndrome.  For his current episode of chest pain we will move forward with the following: -Repeat troponins -Morphine IV 2 mg -Continue to monitor through the night, if episodes persist, consider cardiology consult

## 2020-03-06 NOTE — ED Provider Notes (Signed)
4:07 PM Signout from [place] PA-C at shift change. Here with chest pain. HEART score = 5. History of moderate nonobstructive coronary artery disease, has not followed up in several years.  PCP in [place].  History of diabetes, hypertension, hypercholesterolemia.  Plan for admission to the hospital after hepatic function panel and lipase returned.  These are essentially normal.  I went to recheck patient.  His pain is returning.  Additional medication ordered.  Pain is in the mid chest.    BP 119/68   Pulse 78   Temp 97.8 F (36.6 C) (Oral)   Resp 20   Ht 6\' 2"  (1.88 m)   Wt 108.4 kg   SpO2 97%   BMI 30.69 kg/m    EKG Interpretation  Date/Time:  Thursday March 06 2020 13:50:06 EST Ventricular Rate:  78 PR Interval:  150 QRS Duration: 90 QT Interval:  368 QTC Calculation: 420 R Axis:   -4 Text Interpretation: Sinus rhythm diffuse ST elevations, similar to earlier in the day Confirmed by Sherwood Gambler [phone]) on 03/06/2020 1:58:39 PM      4:21 PM Spoke with IMTS who will see.   BP 119/68   Pulse 78   Temp 97.8 F (36.6 C) (Oral)   Resp 20   Ht 6\' 2"  (1.88 m)   Wt 108.4 kg   SpO2 97%   BMI 30.69 kg/m     Carlisle Cater, PA-C 03/06/20 1621    Davonna Belling, MD 03/06/20 [phone]

## 2020-03-06 NOTE — ED Provider Notes (Signed)
[place] EMERGENCY DEPARTMENT Provider Note   CSN: [id number] Arrival date & time: 03/06/20  1241     History Chief Complaint  Patient presents with  . Chest Pain    [name] is a 55 y.o. male presenting for evaluation of chest pain.  Patient states acutely at work around 9 AM he developed central chest pain.  He described it initially as a tightness, but now is a stabbing pain.  This occurred while he was walking, not overly exerting himself.  He has associated shortness of breath, but states this is due to pain.  No associated nausea, vomiting, or diaphoresis.  He reports several weeks of abdominal pain, this is unchanged.  He also has several weeks of recent cough.  No fevers, chills, sore throat, urinary symptoms, normal bowel movements.  No leg pain or swelling.  He is on multiple medications for diabetes, recently they have been changed and they are working on getting better control.  He reports an associated history of hypertension and hyperlipidemia for which he takes medication.  He previously has seen somebody in the [facility] system for his heart, but does not follow-up regularly with him.  He states his pain today feels slightly different than when he had previous heart problems.  He does not smoke cigarettes, drink alcohol, or use drugs.  Per triage note, pt given asa and nitro with ems. pts pain got better with nitro, but bp dropped to [id number] systolic.   Additional history obtained in chart review.  Patient with a history of CAD, last cath in 2017 which showed moderate disease and pt was given medical tx.  Additional history of hypertension, hyperlipidemia, diabetes, depression.  HPI     Past Medical History:  Diagnosis Date  . Depression   . Diabetes mellitus   . High cholesterol   . Hypertension     Patient Active Problem List   Diagnosis Date Noted  . CAD (coronary artery disease) 08/04/2016  . Vertigo 08/04/2016  . Chest pain 06/25/2015  .  Diabetes mellitus 06/25/2015  . High cholesterol 06/25/2015  . Hypertension 06/25/2015  . Pain in the chest   . Type 2 diabetes mellitus without complication, without long-term current use of insulin [facility])     Past Surgical History:  Procedure Laterality Date  . CARDIAC CATHETERIZATION  2010   Dr Terrence Dupont, D1 20-25%, CFX 20-25%, RCA 10%, EF 50-55%  . CARDIAC CATHETERIZATION N/A 06/27/2015   Procedure: Left Heart Cath and Coronary Angiography;  Surgeon: Sherren Mocha, MD;  Location: [place] CV LAB;  Service: Cardiovascular;  Laterality: N/A;  . CARDIAC CATHETERIZATION N/A 06/27/2015   Procedure: Intravascular Pressure Wire/FFR Study;  Surgeon: Sherren Mocha, MD;  Location: [place] CV LAB;  Service: Cardiovascular;  Laterality: N/A;  . CORONARY ANGIOPLASTY WITH STENT PLACEMENT  2008   per pt, at [place], not seen on 2010 cath  . EYE SURGERY    . KNEE SURGERY     right knee       Family History  Problem Relation Age of Onset  . Hypertension Mother   . Heart failure Father   . Heart attack Father     Social History   Tobacco Use  . Smoking status: Never Smoker  . Smokeless tobacco: Never Used  Vaping Use  . Vaping Use: Never used  Substance Use Topics  . Alcohol use: No  . Drug use: No    Home Medications Prior to Admission medications   Medication  Sig Start Date End Date Taking? Authorizing Provider  aspirin EC 325 MG EC tablet Take 1 tablet (325 mg total) by mouth daily. 06/28/15   Nita Sells, MD  atorvastatin (LIPITOR) 80 MG tablet Take 1 tablet (80 mg total) by mouth daily at 6 PM. 06/28/15   Nita Sells, MD  citalopram (CELEXA) 20 MG tablet Take 20 mg by mouth daily. 06/21/15   [provider]  FARXIGA 10 MG TABS tablet Take 10 mg by mouth daily. 11/20/15   [provider]  glimepiride (AMARYL) 4 MG tablet Take 4 mg by mouth 2 (two) times daily.    [provider]  JANUVIA 100 MG tablet Take 100 mg by mouth  daily. 07/15/16   [provider]  lisinopril (PRINIVIL,ZESTRIL) 40 MG tablet Take 40 mg by mouth daily.  01/17/15   [provider]  lovastatin (MEVACOR) 40 MG tablet Take 40 mg by mouth daily. 07/17/16   [provider]  metFORMIN (GLUCOPHAGE) 850 MG tablet Take 1 tablet (850 mg total) by mouth 3 (three) times daily. 07/03/13   Piepenbrink, Anderson Malta, PA-C  metoprolol tartrate (LOPRESSOR) 25 MG tablet Take 1 tablet (25 mg total) by mouth 2 (two) times daily. 06/28/15   Nita Sells, MD    Allergies    Patient has no known allergies.  Review of Systems   Review of Systems  Respiratory: Positive for shortness of breath.   Cardiovascular: Positive for chest pain.  Gastrointestinal: Positive for abdominal pain.  All other systems reviewed and are negative.   Physical Exam Updated Vital Signs BP 98/63 (BP Location: Left Arm)   Pulse 96   Temp 97.8 F (36.6 C) (Oral)   Resp 20   SpO2 99%   Physical Exam Vitals and nursing note reviewed.  Constitutional:      General: He is not in acute distress.    Appearance: He is well-developed and well-nourished.     Comments: Appears uncomfortable  HENT:     Head: Normocephalic and atraumatic.  Eyes:     Extraocular Movements: Extraocular movements intact and EOM normal.     Conjunctiva/sclera: Conjunctivae normal.     Pupils: Pupils are equal, round, and reactive to light.  Cardiovascular:     Rate and Rhythm: Normal rate and regular rhythm.     Pulses: Normal pulses and intact distal pulses.  Pulmonary:     Effort: Pulmonary effort is normal. No respiratory distress.     Breath sounds: Normal breath sounds. No wheezing.     Comments: Clear lung sounds Abdominal:     General: There is no distension.     Palpations: Abdomen is soft. There is no mass.     Tenderness: There is abdominal tenderness. There is no guarding or rebound.     Comments: Diffuse ttp of the abd  Musculoskeletal:        General:  Normal range of motion.     Cervical back: Normal range of motion and neck supple.     Right lower leg: No edema.     Left lower leg: No edema.  Skin:    General: Skin is warm and dry.     Capillary Refill: Capillary refill takes less than 2 seconds.  Neurological:     Mental Status: He is alert and oriented to person, place, and time.  Psychiatric:        Mood and Affect: Mood and affect normal.     ED Results / Procedures / Treatments  Labs (all labs ordered are listed, but only abnormal results are displayed) Labs Reviewed  CBG MONITORING, ED - Abnormal; Notable for the following components:      Result Value   Glucose-Capillary 380 (*)    All other components within normal limits  SARS CORONAVIRUS 2 BY RT PCR (HOSPITAL ORDER, [place] [facility])  CBC  BASIC METABOLIC PANEL  HEPATIC FUNCTION PANEL  LIPASE, BLOOD  TROPONIN I (HIGH SENSITIVITY)    EKG EKG Interpretation  Date/Time:  Thursday March 06 2020 12:47:42 EST Ventricular Rate:  93 PR Interval:  150 QRS Duration: 86 QT Interval:  344 QTC Calculation: 427 R Axis:   -33 Text Interpretation: Normal sinus rhythm Left axis deviation Minimal voltage criteria for LVH, may be normal variant ( R in aVL ) Nonspecific ST abnormality Abnormal ECG Confirmed by Sherwood Gambler [phone]) on 03/06/2020 1:05:07 PM   EKG Interpretation  Date/Time:  Thursday March 06 2020 13:50:06 EST Ventricular Rate:  78 PR Interval:  150 QRS Duration: 90 QT Interval:  368 QTC Calculation: 420 R Axis:   -4 Text Interpretation: Sinus rhythm diffuse ST elevations, similar to earlier in the day Confirmed by Sherwood Gambler [phone]) on 03/06/2020 1:58:39 PM         Radiology DG Chest 2 View  Result Date: 03/06/2020 CLINICAL DATA:  Chest pain and shortness of breath. EXAM: CHEST - 2 VIEW COMPARISON:  10/31/2018 FINDINGS: The cardiac silhouette, mediastinal and hilar contours are normal. The lungs are clear. No  infiltrates, edema or effusions. No pulmonary lesions. The bony thorax is intact. IMPRESSION: No acute cardiopulmonary findings. Electronically Signed   By: Marijo Sanes M.D.   On: 03/06/2020 13:04    Procedures Procedures   Medications Ordered in ED Medications  fentaNYL (SUBLIMAZE) injection 50 mcg (has no administration in time range)  sodium chloride 0.9 % bolus 1,000 mL (has no administration in time range)    ED Course  I have reviewed the triage vital signs and the nursing notes.  Pertinent labs & imaging results that were available during my care of the patient were reviewed by me and considered in my medical decision making (see chart for details).   MDM Rules/Calculators/A&P                          Pt presenting for evaluation of cp. On exam, pt appears uncomfortable due to pain, otherwise nontoxic. Diffuse ttp of the abd. Consider acs. Consider pe. Consider dm/metabolic cause. ekg from triage without stemi. cxr viewed and interpreted by me, no pna, pnx, effusion. Will order cardiac labs, abd labs, and repeat ekg. BP stable in the ED. cse discussed with attending, Dr. Regenia Skeeter evaluted the pt.   Labs how hyperglycemia and mild aki. Trop normal at 6, will repeat at 2 hrs. No leukocytosis. lfts and lipase pending. Will order noncon chest and abd ct due to pt's aki. Fluids for hyperglycemia. Fentanyl for pain. Pt's heart score is 5. If work up in the ed is negative, consider need for admission for unstable angina and hyperglycemia.   Ct's negative for acute findings. Pt signed out to Alvera Singh, PA-C for f/u on repeat trop and lfts/lipase. Pt to be admitted afterwards for cp r/o and management of hyperglycemia.    Final Clinical Impression(s) / ED Diagnoses Final diagnoses:  Other chest pain  Hyperglycemia    Rx / DC Orders ED Discharge Orders    None  Franchot Heidelberg, PA-C 03/06/20 1524    Sherwood Gambler, MD 03/08/20 1002

## 2020-03-06 NOTE — ED Triage Notes (Signed)
Pt arrives to ED via [facility] from work where he suddenly began to have CP, given 324mg  asa and 1 nitro pain comes and goes. Hx of cardiac stent.   BP AFTER NITRO 80/40, GIVEN 500CC IV ns.  LAST BP; 98/56

## 2020-03-06 NOTE — Progress Notes (Signed)
Patient complaining of 8/10 Cp upon arrival to [facility].  4L O2 [place] applied. Dr. Larae Grooms Teaching Service notified.  Dr. Pilar Plate came to bedside to assess patient.  2mg  IV morphine ordered and given.  Dr. Larae Grooms notified of of EKG completion. No new orders received.
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
## 2020-03-07 ENCOUNTER — Observation Stay (HOSPITAL_BASED_OUTPATIENT_CLINIC_OR_DEPARTMENT_OTHER): Payer: Commercial Managed Care - PPO

## 2020-03-07 ENCOUNTER — Encounter (HOSPITAL_COMMUNITY): Payer: Self-pay | Admitting: Family Medicine

## 2020-03-07 DIAGNOSIS — I10 Essential (primary) hypertension: Secondary | ICD-10-CM

## 2020-03-07 DIAGNOSIS — R072 Precordial pain: Secondary | ICD-10-CM

## 2020-03-07 DIAGNOSIS — R739 Hyperglycemia, unspecified: Secondary | ICD-10-CM | POA: Diagnosis not present

## 2020-03-07 DIAGNOSIS — E119 Type 2 diabetes mellitus without complications: Secondary | ICD-10-CM | POA: Diagnosis not present

## 2020-03-07 DIAGNOSIS — R0789 Other chest pain: Secondary | ICD-10-CM | POA: Diagnosis not present

## 2020-03-07 DIAGNOSIS — E785 Hyperlipidemia, unspecified: Secondary | ICD-10-CM | POA: Diagnosis not present

## 2020-03-07 DIAGNOSIS — R079 Chest pain, unspecified: Secondary | ICD-10-CM | POA: Diagnosis not present

## 2020-03-07 DIAGNOSIS — N179 Acute kidney failure, unspecified: Secondary | ICD-10-CM | POA: Diagnosis not present

## 2020-03-07 DIAGNOSIS — Z20822 Contact with and (suspected) exposure to covid-19: Secondary | ICD-10-CM | POA: Diagnosis not present

## 2020-03-07 LAB — BASIC METABOLIC PANEL
Anion gap: 8 (ref 5–15)
BUN: 22 mg/dL — ABNORMAL HIGH (ref 6–20)
CO2: 22 mmol/L (ref 22–32)
Calcium: 8.9 mg/dL (ref 8.9–10.3)
Chloride: 107 mmol/L (ref 98–111)
Creatinine, Ser: 1.13 mg/dL (ref 0.61–1.24)
GFR, Estimated: >60 mL/min (ref >60)
Glucose, Bld: 153 mg/dL — ABNORMAL HIGH (ref 70–99)
Potassium: 4.8 mmol/L (ref 3.5–5.1)
Sodium: 137 mmol/L (ref 135–145)

## 2020-03-07 LAB — CBC
HCT: 37.4 % — ABNORMAL LOW (ref 39.0–52.0)
Hemoglobin: 12.6 g/dL — ABNORMAL LOW (ref 13.0–17.0)
MCH: 30.9 pg (ref 26.0–34.0)
MCHC: 33.7 g/dL (ref 30.0–36.0)
MCV: 91.7 fL (ref 80.0–100.0)
Platelets: 173 10*3/uL (ref 150–400)
RBC: 4.08 MIL/uL — ABNORMAL LOW (ref 4.22–5.81)
RDW: 13.5 % (ref 11.5–15.5)
WBC: 5.1 10*3/uL (ref 4.0–10.5)
nRBC: 0 % (ref 0.0–0.2)

## 2020-03-07 LAB — ECHOCARDIOGRAM COMPLETE
Area-P 1/2: 4.8 cm2
Calc EF: 63.5 %
Height: 74 in
S' Lateral: 3.2 cm
Single Plane A2C EF: 68.1 %
Single Plane A4C EF: 60.7 %
Weight: 3744 oz

## 2020-03-07 LAB — GLUCOSE, CAPILLARY
Glucose-Capillary: 209 mg/dL — ABNORMAL HIGH (ref 70–99)
Glucose-Capillary: 217 mg/dL — ABNORMAL HIGH (ref 70–99)
Glucose-Capillary: 162 mg/dL — ABNORMAL HIGH (ref 70–99)

## 2020-03-07 LAB — LIPID PANEL
Cholesterol: 113 mg/dL (ref 0–200)
HDL: 37 mg/dL — ABNORMAL LOW (ref >40)
LDL Cholesterol: 47 mg/dL (ref 0–99)
Total CHOL/HDL Ratio: 3.1 RATIO
Triglycerides: 145 mg/dL (ref <150)
VLDL: 29 mg/dL (ref 0–40)

## 2020-03-07 LAB — TROPONIN I (HIGH SENSITIVITY): Troponin I (High Sensitivity): 6 ng/L (ref <18)

## 2020-03-07 MED ORDER — LIDOCAINE 5 % EX PTCH: 1.0000 | MEDICATED_PATCH | Freq: Two times a day (BID) | CUTANEOUS | 0 refills | Status: AC

## 2020-03-07 MED ORDER — ASPIRIN 325 MG PO TABS
325.0000 mg | ORAL_TABLET | Freq: Every day | ORAL | Status: DC
  Administered 2020-03-07: 325 mg via ORAL
  Filled 2020-03-07: qty 1

## 2020-03-07 MED ORDER — PERFLUTREN LIPID MICROSPHERE
1.0000 mL | INTRAVENOUS | Status: AC | PRN
  Administered 2020-03-07: 2 mL via INTRAVENOUS
  Filled 2020-03-07: qty 10

## 2020-03-07 MED ORDER — NAPROXEN 250 MG PO TABS
500.0000 mg | ORAL_TABLET | Freq: Two times a day (BID) | ORAL | Status: DC
  Administered 2020-03-07: 500 mg via ORAL
  Filled 2020-03-07 (×3): qty 2

## 2020-03-07 MED ORDER — LIDOCAINE 5 % EX PTCH: 1.0000 | MEDICATED_PATCH | CUTANEOUS | 0 refills | Status: DC

## 2020-03-07 MED ORDER — ACETAMINOPHEN 325 MG PO TABS: 650.0000 mg | ORAL_TABLET | ORAL | Status: DC | PRN

## 2020-03-07 MED ORDER — LIDOCAINE 5 % EX PTCH: 1.0000 | MEDICATED_PATCH | CUTANEOUS | Status: DC

## 2020-03-07 NOTE — Plan of Care (Signed)

## 2020-03-07 NOTE — Progress Notes (Signed)
Patient c/o of chest pain while this RN and primary RN in room doing skin assessment. Cardiology had previously rounded on patient and stated CP is non-cardiac and to treat with anti-inflammatories. No anti-inflammatories ordered, spoke with primary team, Earline Mayotte, who stated they will get anti-inflammatories ordered for patient. Primary team also made aware patient inquiring about being discharged home, stated they will look into this as well. Primary RN, Bonnita Nasuti, updated and made aware.

## 2020-03-07 NOTE — Discharge Instructions (Signed)
Your hospitalized for further evaluation of your chest pain.  Your medical history was concerning for the possibility of heart related causes of chest pain.  You were kept overnight in the hospital and evaluated by a cardiologist.  Ultimately, it was decided that your chest pain is not related to any trouble with your heart.  Your chest pain is most likely related to a muscle or bone problem.  You seem to have some significant inflammation in your chest and back.  For now, the best medication would be anti-inflammatories (naproxen) and appropriate, over-the-counter, pain medicine.  It is okay for you to take naproxen and Tylenol together in addition to your other medications given to you by the doctor at discharge.  Costochondritis  Costochondritis is inflammation of the tissue (cartilage) that connects the ribs to the breastbone (sternum). This causes pain in the front of the chest. The pain usually starts slowly and involves more than one rib. What are the causes? The exact cause of this condition is not always known. It results from stress on the cartilage where your ribs attach to your sternum. The cause of this stress could be:  Chest injury.  Exercise or activity, such as lifting.  Severe coughing. What increases the risk? You are more likely to develop this condition if you:  Are male.  Are 75-63 years old.  Recently started a new exercise or work activity.  Have low levels of vitamin D.  Have a condition that makes you cough frequently. What are the signs or symptoms? The main symptom of this condition is chest pain. The pain:  Usually starts gradually and can be sharp or dull.  Gets worse with deep breathing, coughing, or exercise.  Gets better with rest.  May be worse when you press on the affected area of your ribs and sternum. How is this diagnosed? This condition is diagnosed based on your symptoms, your medical history, and a physical exam. Your health care  provider will check for pain when pressing on your sternum. You may also have tests to rule out other causes of chest pain. These may include:  A chest X-ray to check for lung problems.  An ECG (electrocardiogram) to see if you have a heart problem that could be causing the pain.  An imaging scan to rule out a chest or rib fracture. How is this treated? This condition usually goes away on its own over time. Your health care provider may prescribe an NSAID, such as ibuprofen, to reduce pain and inflammation. Treatment may also include:  Resting and avoiding activities that make pain worse.  Applying heat or ice to the area to reduce pain and inflammation.  Doing exercises to stretch your chest muscles. If these treatments do not help, your health care provider may inject a numbing medicine at the sternum-rib connection to help relieve the pain. Follow these instructions at home: Managing pain, stiffness, and swelling  If directed, put ice on the painful area. To do this: ? Put ice in a plastic bag. ? Place a towel between your skin and the bag. ? Leave the ice on for 20 minutes, 2-3 times a day.  If directed, apply heat to the affected area as often as told by your health care provider. Use the heat source that your health care provider recommends, such as a moist heat pack or a heating pad. ? Place a towel between your skin and the heat source. ? Leave the heat on for 20-30 minutes. ? Remove  the heat if your skin turns bright red. This is especially important if you are unable to feel pain, heat, or cold. You may have a greater risk of getting burned.      Activity  Rest as told by your health care provider. Avoid activities that make pain worse. This includes any activities that use chest, abdominal, and side muscles.  Do not lift anything that is heavier than 10 lb (4.5 kg), or the limit that you are told, until your health care provider says that it is safe.  Return to your  normal activities as told by your health care provider. Ask your health care provider what activities are safe for you. General instructions  Take over-the-counter and prescription medicines only as told by your health care provider.  Keep all follow-up visits as told by your health care provider. This is important. Contact a health care provider if:  You have chills or a fever.  Your pain does not go away or it gets worse.  You have a cough that does not go away. Get help right away if:  You have shortness of breath.  You have severe chest pain that is not relieved by medicines, heat, or ice. These symptoms may represent a serious problem that is an emergency. Do not wait to see if the symptoms will go away. Get medical help right away. Call your local emergency services (911 in the [place] Do not drive yourself to the hospital.  Summary  Costochondritis is inflammation of the tissue (cartilage) that connects the ribs to the breastbone (sternum).  This condition causes pain in the front of the chest.  Costochondritis results from stress on the cartilage where your ribs attach to your sternum.  Treatment may include medicines, rest, heat or ice, and exercises. This information is not intended to replace advice given to you by your health care provider. Make sure you discuss any questions you have with your health care provider. Document Revised: 12/01/2018 Document Reviewed: 12/01/2018 Elsevier Patient Education  2021 [employer].
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
## 2020-03-07 NOTE — Progress Notes (Signed)
Results for [name], [name] (MRN [id number]) as of 03/07/2020 15:17  Ref. Range 03/06/2020 13:43 03/06/2020 20:12 03/07/2020 08:06 03/07/2020 11:44  Glucose-Capillary Latest Ref Range: 70 - 99 mg/dL 380 (H) 223 (H) 209 (H) 217 (H)  Noted that blood sugars continue to be greater than 180 mg/dl.  Recommend increasing Novolog to MODERATE correction scale TID if blood sugars continue to be elevated.   Harvel Ricks RN BSN CDE Diabetes Coordinator Pager: [phone]  8am-5pm

## 2020-03-07 NOTE — Progress Notes (Signed)
  Echocardiogram 2D Echocardiogram has been performed.  Geoffery Lyons Swaim 03/07/2020, 11:52 AM

## 2020-03-07 NOTE — Discharge Summary (Signed)
[facility] Discharge Summary  Patient name: [name] Medical record number: [id number] Date of birth: [birth year] Age: 55 y.o. Gender: male Date of Admission: 03/06/2020  Date of Discharge: 03/07/2020 Admitting Physician: Anthony Haymaker, MD  Primary Care Provider: Cher Nakai, MD Consultants: Cardiology  Indication for Hospitalization: Chest pain  Discharge Diagnoses/Problem List:  Acute chest pain, likely musculoskeletal AKI-resolved Type 2 diabetes Hyperlipidemia and hypertension GERD  Disposition: Discharge home  Discharge Condition: Stable  Discharge Exam:  General: Alert and cooperative and appears to be in no acute distress. Resting in bed comfortably. Cardio: Normal S1 and S2, no S3 or S4. Rhythm is regular. No murmurs or rubs.  Pulm: Clear to auscultation bilaterally, no crackles, wheezing, or diminished breath sounds. Normal respiratory effort Abdomen: Bowel sounds normal. Abdomen soft and non-tender.  Extremities: No peripheral edema. Warm/ well perfused.  Strong radial pulses.  Brief Hospital Course:  [name] a 55 y.o.malepresenting with chest pain. PMH is significant foruncontolled T2DM (admission A1c 13.8%), HLD, HTN, GERD, and CAD.  Musculoskeletal chest pain [name] presented to the emergency room with roughly 24 hours of sharp central chest pain with radiation to his back and jaw. His EKG showed no evidence of ischemia and troponins were unremarkable. He was admitted for further observation and ACS rule out given his risk factors of hypertension, diabetes, hyperlipidemia, previous pulmonary embolism. His hospitalization was ultimately unremarkable and cardiology assessed him on the morning of 2/4. Cardiology ultimately determined that it was very unlikely his pain was from cardiac etiology. He was discharged with naproxen and lidocaine patches for his discomfort.  Diabetes, type II On presentation to the hospital, his blood sugars  were around 450. He reported taking an extensive diabetic regimen at home including: Metformin, Amaryl, Januvia and [place]. He was admitted and put on a insulin sliding scale which improved his blood sugar to the 100-200 range. He was encouraged to follow-up with his primary care doctor for improved blood sugar control. He was discharged on his home medication.   Issues for Follow Up:  1. Ensure resolution of musculoskeletal chest pain. 2. Continue to explain the importance of improved glucose control.  Significant Procedures: none  Significant Labs and Imaging:  Recent Labs  Lab 03/06/20 1250 03/07/20 0338  WBC 7.3 5.1  HGB 13.6 12.6*  HCT 40.1 37.4*  PLT 222 173   Recent Labs  Lab 03/06/20 1250 03/06/20 1425 03/07/20 0338  NA 131*  --  137  K 4.2  --  4.8  CL 100  --  107  CO2 18*  --  22  GLUCOSE 453*  --  153*  BUN 34*  --  22*  CREATININE 1.89*  --  1.13  CALCIUM 9.0  --  8.9  ALKPHOS  --  49  --   AST  --  21  --   ALT  --  25  --   ALBUMIN  --  3.0*  --     CT ABDOMEN PELVIS WO CONTRAST  Result Date: 03/06/2020 CLINICAL DATA:  Persistent cough, lower chest and abdominal pain EXAM: CT CHEST, ABDOMEN AND PELVIS WITHOUT CONTRAST TECHNIQUE: Multidetector CT imaging of the chest, abdomen and pelvis was performed following the standard protocol without IV contrast. COMPARISON:  CT chest angiogram, 11/01/2018 FINDINGS: CT CHEST FINDINGS Cardiovascular: No significant vascular findings. Normal heart size. No pericardial effusion. Mediastinum/Nodes: No enlarged mediastinal, hilar, or axillary lymph nodes. Thyroid gland, trachea, and esophagus demonstrate no significant findings. Lungs/Pleura: Minimal  dependent bibasilar scarring and or atelectasis. No pleural effusion or pneumothorax. Musculoskeletal: No chest wall mass or suspicious bone lesions identified. CT ABDOMEN PELVIS FINDINGS Hepatobiliary: No solid liver abnormality is seen. No gallstones, gallbladder wall thickening,  or biliary dilatation. Pancreas: Unremarkable. No pancreatic ductal dilatation or surrounding inflammatory changes. Spleen: Normal in size without significant abnormality. Adrenals/Urinary Tract: Adrenal glands are unremarkable. Kidneys are normal, without renal calculi, solid lesion, or hydronephrosis. Bladder is unremarkable. Stomach/Bowel: Stomach is within normal limits. Appendix appears normal. No evidence of bowel wall thickening, distention, or inflammatory changes. Vascular/Lymphatic: No significant vascular findings are present. No enlarged abdominal or pelvic lymph nodes. Reproductive: Prostatomegaly. Other: No abdominal wall hernia or abnormality. No abdominopelvic ascites. Musculoskeletal: No acute or significant osseous findings. IMPRESSION: 1. No non-contrast CT findings of the chest to explain cough. Minimal dependent bibasilar scarring. 2. No noncontrast CT findings of the chest, abdomen, or pelvis to explain pain. 3. Prostatomegaly. Electronically Signed   By: [name] M.D.   On: 03/06/2020 15:09   DG Chest 2 View  Result Date: 03/06/2020 CLINICAL DATA:  Chest pain and shortness of breath. EXAM: CHEST - 2 VIEW COMPARISON:  10/31/2018 FINDINGS: The cardiac silhouette, mediastinal and hilar contours are normal. The lungs are clear. No infiltrates, edema or effusions. No pulmonary lesions. The bony thorax is intact. IMPRESSION: No acute cardiopulmonary findings. Electronically Signed   By: [name] M.D.   On: 03/06/2020 13:04   CT Chest Wo Contrast  Result Date: 03/06/2020 CLINICAL DATA:  Persistent cough, lower chest and abdominal pain EXAM: CT CHEST, ABDOMEN AND PELVIS WITHOUT CONTRAST TECHNIQUE: Multidetector CT imaging of the chest, abdomen and pelvis was performed following the standard protocol without IV contrast. COMPARISON:  CT chest angiogram, 11/01/2018 FINDINGS: CT CHEST FINDINGS Cardiovascular: No significant vascular findings. Normal heart size. No pericardial effusion.  Mediastinum/Nodes: No enlarged mediastinal, hilar, or axillary lymph nodes. Thyroid gland, trachea, and esophagus demonstrate no significant findings. Lungs/Pleura: Minimal dependent bibasilar scarring and or atelectasis. No pleural effusion or pneumothorax. Musculoskeletal: No chest wall mass or suspicious bone lesions identified. CT ABDOMEN PELVIS FINDINGS Hepatobiliary: No solid liver abnormality is seen. No gallstones, gallbladder wall thickening, or biliary dilatation. Pancreas: Unremarkable. No pancreatic ductal dilatation or surrounding inflammatory changes. Spleen: Normal in size without significant abnormality. Adrenals/Urinary Tract: Adrenal glands are unremarkable. Kidneys are normal, without renal calculi, solid lesion, or hydronephrosis. Bladder is unremarkable. Stomach/Bowel: Stomach is within normal limits. Appendix appears normal. No evidence of bowel wall thickening, distention, or inflammatory changes. Vascular/Lymphatic: No significant vascular findings are present. No enlarged abdominal or pelvic lymph nodes. Reproductive: Prostatomegaly. Other: No abdominal wall hernia or abnormality. No abdominopelvic ascites. Musculoskeletal: No acute or significant osseous findings. IMPRESSION: 1. No non-contrast CT findings of the chest to explain cough. Minimal dependent bibasilar scarring. 2. No noncontrast CT findings of the chest, abdomen, or pelvis to explain pain. 3. Prostatomegaly. Electronically Signed   By: [name] M.D.   On: 03/06/2020 15:09   ECHOCARDIOGRAM COMPLETE  Result Date: 03/07/2020    ECHOCARDIOGRAM REPORT   Patient Name:   [name] Date of Exam: 03/07/2020 Medical Rec #:  [id number]  Height:       74.0 in Accession #:    [id number] Weight:       234.0 lb Date of Birth:  1965/06/15  BSA:          2.323 m Patient Age:    31 years   BP:  121/59 mmHg Patient Gender: M          HR:           59 bpm. Exam Location:  Inpatient Procedure: 2D Echo, Cardiac Doppler, Color Doppler  and Intracardiac            Opacification Agent Indications:    Chest Pain R07.9  History:        Patient has prior history of Echocardiogram examinations, most                 recent 08/04/2016. Risk Factors:Hypertension, Diabetes,                 Dyslipidemia and Non-Smoker.  Sonographer:    Vickie Epley RDCS Referring Phys: [place]  1. Left ventricular ejection fraction, by estimation, is 60 to 65%. The left ventricle has normal function. The left ventricle has no regional wall motion abnormalities. Left ventricular diastolic parameters were normal.  2. Right ventricular systolic function is normal. The right ventricular size is normal.  3. The mitral valve is normal in structure. No evidence of mitral valve regurgitation. No evidence of mitral stenosis.  4. The aortic valve is normal in structure. Aortic valve regurgitation is not visualized. No aortic stenosis is present.  5. The inferior vena cava is normal in size with greater than 50% respiratory variability, suggesting right atrial pressure of 3 mmHg. FINDINGS  Left Ventricle: Left ventricular ejection fraction, by estimation, is 60 to 65%. The left ventricle has normal function. The left ventricle has no regional wall motion abnormalities. Definity contrast agent was given IV to delineate the left ventricular  endocardial borders. The left ventricular internal cavity size was normal in size. There is no left ventricular hypertrophy. Left ventricular diastolic parameters were normal. Right Ventricle: The right ventricular size is normal. No increase in right ventricular wall thickness. Right ventricular systolic function is normal. Left Atrium: Left atrial size was normal in size. Right Atrium: Right atrial size was normal in size. Pericardium: There is no evidence of pericardial effusion. Mitral Valve: The mitral valve is normal in structure. No evidence of mitral valve regurgitation. No evidence of mitral valve stenosis.  Tricuspid Valve: The tricuspid valve is normal in structure. Tricuspid valve regurgitation is not demonstrated. No evidence of tricuspid stenosis. Aortic Valve: The aortic valve is normal in structure. Aortic valve regurgitation is not visualized. No aortic stenosis is present. Pulmonic Valve: The pulmonic valve was normal in structure. Pulmonic valve regurgitation is not visualized. No evidence of pulmonic stenosis. Aorta: The aortic root is normal in size and structure. Venous: The inferior vena cava is normal in size with greater than 50% respiratory variability, suggesting right atrial pressure of 3 mmHg. IAS/Shunts: No atrial level shunt detected by color flow Doppler.  LEFT VENTRICLE PLAX 2D LVIDd:         4.80 cm      Diastology LVIDs:         3.20 cm      LV e' medial:    7.89 cm/s LV PW:         0.90 cm      LV E/e' medial:  6.8 LV IVS:        0.90 cm      LV e' lateral:   12.50 cm/s LVOT diam:     2.20 cm      LV E/e' lateral: 4.3 LV SV:         68 LV SV  Index:   29 LVOT Area:     3.80 cm  LV Volumes (MOD) LV vol d, MOD A2C: 101.0 ml LV vol d, MOD A4C: 108.0 ml LV vol s, MOD A2C: 32.2 ml LV vol s, MOD A4C: 42.4 ml LV SV MOD A2C:     68.8 ml LV SV MOD A4C:     108.0 ml LV SV MOD BP:      66.1 ml RIGHT VENTRICLE RV S prime:     13.40 cm/s TAPSE (M-mode): 1.9 cm LEFT ATRIUM           Index       RIGHT ATRIUM           Index LA diam:      3.80 cm 1.64 cm/m  RA Area:     13.40 cm LA Vol (A2C): 24.1 ml 10.37 ml/m RA Volume:   35.40 ml  15.24 ml/m LA Vol (A4C): 34.2 ml 14.72 ml/m  AORTIC VALVE LVOT Vmax:   98.60 cm/s LVOT Vmean:  64.600 cm/s LVOT VTI:    0.179 m  AORTA Ao Root diam: 3.20 cm MITRAL VALVE MV Area (PHT): 4.80 cm    SHUNTS MV Decel Time: 158 msec    Systemic VTI:  0.18 m MV E velocity: 53.30 cm/s  Systemic Diam: 2.20 cm MV A velocity: 34.00 cm/s MV E/A ratio:  1.57 Candee Furbish MD Electronically signed by Candee Furbish MD Signature Date/Time: 03/07/2020/2:22:18 PM    Final      Results/Tests  Pending at Time of Discharge: none  Discharge Medications:  Allergies as of 03/07/2020   No Known Allergies     Medication List    STOP taking these medications   lovastatin 40 MG tablet Commonly known as: MEVACOR     TAKE these medications   acetaminophen 325 MG tablet Commonly known as: TYLENOL Take 2 tablets (650 mg total) by mouth every 4 (four) hours as needed for headache or mild pain.   aspirin 325 MG EC tablet Take 1 tablet (325 mg total) by mouth daily.   atorvastatin 80 MG tablet Commonly known as: LIPITOR Take 1 tablet (80 mg total) by mouth daily at 6 PM.   Farxiga 10 MG Tabs tablet Generic drug: dapagliflozin propanediol Take 10 mg by mouth daily.   glimepiride 4 MG tablet Commonly known as: AMARYL Take 4 mg by mouth 2 (two) times daily.   Januvia 100 MG tablet Generic drug: sitaGLIPtin Take 100 mg by mouth daily.   lidocaine 5 % Commonly known as: Lidoderm Place 1 patch onto the skin every 12 (twelve) hours. Remove & Discard patch within 12 hours or as directed by MD   lidocaine 5 % Commonly known as: LIDODERM Place 1 patch onto the skin daily. Remove & Discard patch within 12 hours or as directed by MD   lisinopril 40 MG tablet Commonly known as: ZESTRIL Take 40 mg by mouth daily.   metFORMIN 850 MG tablet Commonly known as: GLUCOPHAGE Take 1 tablet (850 mg total) by mouth 3 (three) times daily.   metoprolol tartrate 25 MG tablet Commonly known as: LOPRESSOR Take 1 tablet (25 mg total) by mouth 2 (two) times daily.   pantoprazole 40 MG tablet Commonly known as: PROTONIX Take 40 mg by mouth daily.       Discharge Instructions: Please refer to Patient Instructions section of EMR for full details.  Patient was counseled important signs and symptoms that should prompt return to medical care, changes in  medications, dietary instructions, activity restrictions, and follow up appointments.   Follow-Up Appointments:  Follow-up Information     Anthony Nakai, MD. Schedule an appointment as soon as possible for a visit.   Specialty: Internal Medicine Why: Please schedule a follow-up appointment in the next month to discuss this hospitalization and appropriate diabetes control. Contact information: [address] [place] [address] [phone]               Anthony Haymaker, MD 03/07/2020, 4:54 PM PGY-3, [place]

## 2020-03-07 NOTE — Progress Notes (Addendum)
Family Medicine Teaching Service Daily Progress Note Intern Pager: [phone]  Patient name: [name] Medical record number: [id number] Date of birth: [birth year] Age: 55 y.o. Gender: male  Primary Care Provider: Cher Nakai, MD Consultants: Cardiology Code Status: Full code  Pt Overview and Major Events to Date:  Chest pain admission 2/3  Assessment and Plan: [name] is a 55 y.o. male presenting with chest pain for two day. PMH is significant for uncontolled T2DM (admission A1c 13.8%), HLD, HTN, GERD, and CAD.    Acute Chest Pain  History of CAD with Catherization (2017) Telemetry overnight was.  Troponins 5 and 6 overnight.  Am EKG showed no evidence of ischemia.  Cardiology was consulted this am.  Physical exam is suspicious for MSK etiology although his medical history warrants additional consideration, he has many risk factors for coronary events.  - ASA 325mg  -Morphine 2mg  IV q4 PRN and Tylenol 650mg  q4 PRN for pain control -Avoid nitro - dropped sBP into 80s per EMS -Follow-up ECHO results -Consulted cardiology, appreciate recs  AKI - resolved Admission Cr 1.89->1.13, baseline 1.1. Urinalysis showed >500 glucose without nitrites or LE. -Hold lisinopril -DC fluids  Type 2 Diabetes Mellitus with Hyperglycemia BG now improved compared to admission. BG 150-220 on SSI sensitive.  -SSI sensetive -Resume home medications at discharge   Hyperlipidemia No recent cholesterol panel on file. PTA atorvastatin 80mg  PO daily. -Follow-up on lipid panel (ordered 2/4) -Assess risk stratification when resulted -Continue home medications   Hypertension Normotensive since arrival to ED. BP ranges from 119-98/61-77. Current home regimen includes lisinopril 40mg , metoprolol tartrate 25mg  -Continue metoprolol 25mg  -Resume lisinopril at discharge  GERD Patient complains of abdominal pain with eating on admission, but has since resolved. TTP in epigastric and LUQ improved. No  history of EGD, ulcers, H.pylori. CT abdomen negative for structural abnormalities.  -Continue PTA regimen of Protonix 40mg  qd  Protein-Deficient Malnutrition Albumin 3.0, total protein 5.7 on admission -Nutritional education, encourage well-balanced diet  FEN/GI: Carb modified diet Prophylaxis: Lovenox 40mg  SQ  Disposition: Observation status, discharge to home when pain controlled  Subjective:  2 episodes of chest pain overnight.  One around 11pm and another around 8am.  Improved with morphine.  The morning episode seemed to be related to eating. No nausea or vomiting.  Continued chest and back pain.  Objective: Temp:  [97.7 F (36.5 C)-98.1 F (36.7 C)] 98.1 F (36.7 C) (02/04 0621) Pulse Rate:  [67-96] 78 (02/04 0621) Resp:  [14-27] 15 (02/04 0621) BP: (88-135)/(56-77) 121/59 (02/04 0621) SpO2:  [96 %-99 %] 98 % (02/03 1826) Weight:  [106.1 kg-108.4 kg] 106.1 kg (02/04 6073)  Physical Exam:  General: Alert and cooperative and appears to be in no acute distress. Resting in bed comfortably. Cardio: Normal S1 and S2, no S3 or S4. Rhythm is regular. No murmurs or rubs.  Pulm: Clear to auscultation bilaterally, no crackles, wheezing, or diminished breath sounds. Normal respiratory effort Abdomen: Bowel sounds normal. Abdomen soft and non-tender.  Extremities: No peripheral edema. Warm/ well perfused.  Strong radial pulses.  Laboratory: Recent Labs  Lab 03/06/20 1250 03/07/20 0338  WBC 7.3 5.1  HGB 13.6 12.6*  HCT 40.1 37.4*  PLT 222 173   Recent Labs  Lab 03/06/20 1250 03/06/20 1425 03/07/20 0338  NA 131*  --  137  K 4.2  --  4.8  CL 100  --  107  CO2 18*  --  22  BUN 34*  --  22*  CREATININE 1.89*  --  1.13  CALCIUM 9.0  --  8.9  PROT  --  5.7*  --   BILITOT  --  0.7  --   ALKPHOS  --  49  --   ALT  --  25  --   AST  --  21  --   GLUCOSE 453*  --  153*      Imaging/Diagnostic Tests:  ED EKG: NSR with nonspecific ST elevations in V1-V4, consistent  with prior ECGs  CXR 03/06/20: IMPRESSION: No acute cardiopulmonary findings.  CT CHEST, ABDOMEN AND PELVIS WITHOUT CONTRAST 03/06/20: IMPRESSION: 1. No non-contrast CT findings of the chest to explain cough. Minimal dependent bibasilar scarring. 2. No noncontrast CT findings of the chest, abdomen, or pelvis to explain pain. 3. Prostatomegaly.    [name], Medical Student 03/07/2020, 7:28 AM Acting Intern, [place] Acting Intern pager: [phone], text pages welcome   FPTS Upper-Level Resident Addendum   I have independently interviewed and examined the patient. I have discussed the above with the original author and agree with their documentation. My edits for correction/addition/clarification are in blue. Please see also any attending notes.    Anthony Haymaker MD PGY-3, [facility] 03/07/2020 3:48 PM  FPTS Service pager: [phone] (text pages welcome through [facility])

## 2020-04-04 ENCOUNTER — Emergency Department (HOSPITAL_COMMUNITY)
Admission: EM | Admit: 2020-04-04 | Discharge: 2020-04-05 | Disposition: A | Discharge status: Home / Self Care | Payer: Commercial Managed Care - PPO | Attending: Emergency Medicine | Admitting: Emergency Medicine

## 2020-04-04 ENCOUNTER — Encounter (HOSPITAL_COMMUNITY): Payer: Self-pay

## 2020-04-04 ENCOUNTER — Other Ambulatory Visit: Payer: Self-pay

## 2020-04-04 DIAGNOSIS — W228XXA Striking against or struck by other objects, initial encounter: Secondary | ICD-10-CM | POA: Diagnosis not present

## 2020-04-04 DIAGNOSIS — E119 Type 2 diabetes mellitus without complications: Secondary | ICD-10-CM | POA: Insufficient documentation

## 2020-04-04 DIAGNOSIS — Z951 Presence of aortocoronary bypass graft: Secondary | ICD-10-CM | POA: Diagnosis not present

## 2020-04-04 DIAGNOSIS — Z79899 Other long term (current) drug therapy: Secondary | ICD-10-CM | POA: Diagnosis not present

## 2020-04-04 DIAGNOSIS — Y92511 Restaurant or cafe as the place of occurrence of the external cause: Secondary | ICD-10-CM | POA: Insufficient documentation

## 2020-04-04 DIAGNOSIS — Z7984 Long term (current) use of oral hypoglycemic drugs: Secondary | ICD-10-CM | POA: Diagnosis not present

## 2020-04-04 DIAGNOSIS — I2510 Atherosclerotic heart disease of native coronary artery without angina pectoris: Secondary | ICD-10-CM | POA: Diagnosis not present

## 2020-04-04 DIAGNOSIS — I10 Essential (primary) hypertension: Secondary | ICD-10-CM | POA: Diagnosis not present

## 2020-04-04 DIAGNOSIS — Z7982 Long term (current) use of aspirin: Secondary | ICD-10-CM | POA: Insufficient documentation

## 2020-04-04 DIAGNOSIS — S0990XA Unspecified injury of head, initial encounter: Secondary | ICD-10-CM | POA: Insufficient documentation

## 2020-04-04 NOTE — ED Triage Notes (Signed)
Pt reports that he went to [facility] and hit head on a light fixture. Denies LOC and does not take blood thinners. Reports headache.

## 2020-04-05 MED ORDER — IBUPROFEN 400 MG PO TABS
600.0000 mg | ORAL_TABLET | Freq: Once | ORAL | Status: AC
  Administered 2020-04-05: 600 mg via ORAL
  Filled 2020-04-05: qty 1

## 2020-04-05 NOTE — ED Notes (Signed)
Patient sitting on side of stretcher independently without issue. Respirations even and unlabored. No acute distress noted.

## 2020-04-05 NOTE — ED Provider Notes (Signed)
[facility] EMERGENCY DEPARTMENT Provider Note   CSN: [id number] Arrival date & time: 04/04/20  2022     History Chief Complaint  Patient presents with  . Head Injury    [name] is a 55 y.o. male.  56 year old male with past medical history below who presents with head injury.  At approximately 3 PM today, patient went to sit down at a table at [employer] and struck his right forehead on an overhanging light fixture. No LOC. He reports headache since the event. No vision changes, vomiting, ataxia, neck pain, or focal weakness. No anticoagulant use. No meds PTA.   The history is provided by the patient.  Head Injury      Past Medical History:  Diagnosis Date  . Depression   . Diabetes mellitus   . High cholesterol   . Hypertension     Patient Active Problem List   Diagnosis Date Noted  . CAD (coronary artery disease) 08/04/2016  . Vertigo 08/04/2016  . Chest pain 06/25/2015  . Diabetes mellitus 06/25/2015  . High cholesterol 06/25/2015  . Hypertension 06/25/2015  . Pain in the chest   . Type 2 diabetes mellitus without complication, without long-term current use of insulin [facility])     Past Surgical History:  Procedure Laterality Date  . CARDIAC CATHETERIZATION  2010   Dr Terrence Dupont, D1 20-25%, CFX 20-25%, RCA 10%, EF 50-55%  . CARDIAC CATHETERIZATION N/A 06/27/2015   Procedure: Left Heart Cath and Coronary Angiography;  Surgeon: Sherren Mocha, MD;  Location: [place] [facility];  Service: Cardiovascular;  Laterality: N/A;  . CARDIAC CATHETERIZATION N/A 06/27/2015   Procedure: Intravascular Pressure Wire/FFR Study;  Surgeon: Sherren Mocha, MD;  Location: [place] [facility];  Service: Cardiovascular;  Laterality: N/A;  . CORONARY ANGIOPLASTY WITH STENT PLACEMENT  2008   per pt, at [place], not seen on 2010 cath  . EYE SURGERY    . KNEE SURGERY     right knee       Family History  Problem Relation Age of Onset  . Hypertension Mother   .  Heart failure Father   . Heart attack Father     Social History   Tobacco Use  . Smoking status: Never Smoker  . Smokeless tobacco: Never Used  Vaping Use  . Vaping Use: Never used  Substance Use Topics  . Alcohol use: No  . Drug use: No    Home Medications Prior to Admission medications   Medication Sig Start Date End Date Taking? Authorizing Provider  acetaminophen (TYLENOL) 325 MG tablet Take 2 tablets (650 mg total) by mouth every 4 (four) hours as needed for headache or mild pain. 03/07/20   Matilde Haymaker, MD  aspirin EC 325 MG EC tablet Take 1 tablet (325 mg total) by mouth daily. 06/28/15   Nita Sells, MD  atorvastatin (LIPITOR) 80 MG tablet Take 1 tablet (80 mg total) by mouth daily at 6 PM. Patient not taking: No sig reported 06/28/15   Nita Sells, MD  FARXIGA 10 MG TABS tablet Take 10 mg by mouth daily. 11/20/15   [provider]  glimepiride (AMARYL) 4 MG tablet Take 4 mg by mouth 2 (two) times daily.    [provider]  JANUVIA 100 MG tablet Take 100 mg by mouth daily. 07/15/16   [provider]  lidocaine (LIDODERM) 5 % Place 1 patch onto the skin every 12 (twelve) hours. Remove & Discard patch within 12 hours or as  directed by MD 03/07/20 03/07/21  Matilde Haymaker, MD  lidocaine (LIDODERM) 5 % Place 1 patch onto the skin daily. Remove & Discard patch within 12 hours or as directed by MD 03/07/20   Matilde Haymaker, MD  lisinopril (PRINIVIL,ZESTRIL) 40 MG tablet Take 40 mg by mouth daily.  01/17/15   [provider]  metFORMIN (GLUCOPHAGE) 850 MG tablet Take 1 tablet (850 mg total) by mouth 3 (three) times daily. 07/03/13   Piepenbrink, Anderson Malta, PA-C  metoprolol tartrate (LOPRESSOR) 25 MG tablet Take 1 tablet (25 mg total) by mouth 2 (two) times daily. 06/28/15   Nita Sells, MD  pantoprazole (PROTONIX) 40 MG tablet Take 40 mg by mouth daily. 02/18/20   [provider]    Allergies    Patient has no known  allergies.  Review of Systems   Review of Systems  Physical Exam Updated Vital Signs BP 131/87 (BP Location: Right Arm)   Pulse 75   Temp 98.4 F (36.9 C) (Oral)   Resp 18   Ht 6\' 2"  (1.88 m)   Wt 106.1 kg   SpO2 100%   BMI 30.04 kg/m   Physical Exam Vitals and nursing note reviewed.  Constitutional:      General: He is not in acute distress.    Appearance: He is well-developed.     Comments: Awake, alert  HENT:     Head: Normocephalic and atraumatic.     Comments: Tenderness to palpation of right forehead without hematoma or obvious trauma Eyes:     Extraocular Movements: Extraocular movements intact.     Conjunctiva/sclera: Conjunctivae normal.     Pupils: Pupils are equal, round, and reactive to light.  Cardiovascular:     Rate and Rhythm: Normal rate and regular rhythm.     Heart sounds: Normal heart sounds. No murmur heard.   Pulmonary:     Effort: Pulmonary effort is normal. No respiratory distress.     Breath sounds: Normal breath sounds.  Abdominal:     General: Bowel sounds are normal. There is no distension.     Palpations: Abdomen is soft.     Tenderness: There is no abdominal tenderness.  Musculoskeletal:        General: No swelling.     Cervical back: Normal range of motion and neck supple. No tenderness.  Skin:    General: Skin is warm and dry.  Neurological:     Mental Status: He is alert and oriented to person, place, and time.     Cranial Nerves: No cranial nerve deficit.     Sensory: No sensory deficit.     Motor: No abnormal muscle tone.     Deep Tendon Reflexes: Reflexes are normal and symmetric. Reflexes normal.     Comments: Fluent speech 5/5 strength and normal sensation x all 4 extremities  Psychiatric:        Thought Content: Thought content normal.        Judgment: Judgment normal.     ED Results / Procedures / Treatments   Labs (all labs ordered are listed, but only abnormal results are displayed) Labs Reviewed - No data to  display  EKG None  Radiology No results found.  Procedures Procedures   Medications Ordered in ED Medications - No data to display  ED Course  I have reviewed the triage vital signs and the nursing notes.     MDM Rules/Calculators/A&P  Alert, well appearing, neurologically intact on exam.  He is 12 hours out from his head injury with no concerning symptoms.  Given his low mechanism, I do not feel he needs head imaging at this time.  Gave Motrin and discussed supportive measures for headache.  I have extensively reviewed return precautions including confusion, vomiting, worsening pain, ataxia.  Patient voiced understanding. Final Clinical Impression(s) / ED Diagnoses Final diagnoses:  Closed head injury, initial encounter    Rx / DC Orders ED Discharge Orders    None       Corrinne Benegas, [name], MD 04/05/20 [phone]

## 2020-08-20 ENCOUNTER — Emergency Department (HOSPITAL_COMMUNITY)
Admission: EM | Admit: 2020-08-20 | Discharge: 2020-08-20 | Disposition: A | Discharge status: Home / Self Care | Payer: Commercial Managed Care - PPO | Attending: Physician Assistant | Admitting: Physician Assistant

## 2020-08-20 ENCOUNTER — Encounter (HOSPITAL_COMMUNITY): Payer: Self-pay | Admitting: Emergency Medicine

## 2020-08-20 ENCOUNTER — Other Ambulatory Visit: Payer: Self-pay

## 2020-08-20 DIAGNOSIS — R059 Cough, unspecified: Secondary | ICD-10-CM | POA: Diagnosis not present

## 2020-08-20 DIAGNOSIS — R109 Unspecified abdominal pain: Secondary | ICD-10-CM | POA: Insufficient documentation

## 2020-08-20 DIAGNOSIS — R112 Nausea with vomiting, unspecified: Secondary | ICD-10-CM | POA: Diagnosis present

## 2020-08-20 DIAGNOSIS — R531 Weakness: Secondary | ICD-10-CM | POA: Diagnosis not present

## 2020-08-20 DIAGNOSIS — Z5321 Procedure and treatment not carried out due to patient leaving prior to being seen by health care provider: Secondary | ICD-10-CM | POA: Insufficient documentation

## 2020-08-20 LAB — COMPREHENSIVE METABOLIC PANEL
ALT: 22 U/L (ref 0–44)
AST: 17 U/L (ref 15–41)
Albumin: 4 g/dL (ref 3.5–5.0)
Alkaline Phosphatase: 54 U/L (ref 38–126)
Anion gap: 13 (ref 5–15)
BUN: 19 mg/dL (ref 6–20)
CO2: 23 mmol/L (ref 22–32)
Calcium: 9.8 mg/dL (ref 8.9–10.3)
Chloride: 100 mmol/L (ref 98–111)
Creatinine, Ser: 1.2 mg/dL (ref 0.61–1.24)
GFR, Estimated: >60 mL/min (ref >60)
Glucose, Bld: 143 mg/dL — ABNORMAL HIGH (ref 70–99)
Potassium: 4.4 mmol/L (ref 3.5–5.1)
Sodium: 136 mmol/L (ref 135–145)
Total Bilirubin: 0.7 mg/dL (ref 0.3–1.2)
Total Protein: 7.6 g/dL (ref 6.5–8.1)

## 2020-08-20 LAB — CBC WITH DIFFERENTIAL/PLATELET
Abs Immature Granulocytes: 0.13 10*3/uL — ABNORMAL HIGH (ref 0.00–0.07)
Basophils Absolute: 0 10*3/uL (ref 0.0–0.1)
Basophils Relative: 0 %
Eosinophils Absolute: 0 10*3/uL (ref 0.0–0.5)
Eosinophils Relative: 0 %
HCT: 47 % (ref 39.0–52.0)
Hemoglobin: 16.4 g/dL (ref 13.0–17.0)
Immature Granulocytes: 1 %
Lymphocytes Relative: 11 %
Lymphs Abs: 1.2 10*3/uL (ref 0.7–4.0)
MCH: 30.3 pg (ref 26.0–34.0)
MCHC: 34.9 g/dL (ref 30.0–36.0)
MCV: 86.7 fL (ref 80.0–100.0)
Monocytes Absolute: 0.7 10*3/uL (ref 0.1–1.0)
Monocytes Relative: 7 %
Neutro Abs: 8.2 10*3/uL — ABNORMAL HIGH (ref 1.7–7.7)
Neutrophils Relative %: 81 %
Platelets: 202 10*3/uL (ref 150–400)
RBC: 5.42 MIL/uL (ref 4.22–5.81)
RDW: 14 % (ref 11.5–15.5)
WBC: 10.2 10*3/uL (ref 4.0–10.5)
nRBC: 0 % (ref 0.0–0.2)

## 2020-08-20 LAB — LIPASE, BLOOD: Lipase: 32 U/L (ref 11–51)

## 2020-08-20 NOTE — ED Provider Notes (Signed)
Emergency Medicine Provider Triage Evaluation Note  [name] , a 55 y.o. male  was evaluated in triage.  Pt complains of nausea and vomiting that started this morning.  Patient states that he had 1 episode of vomiting.  He states it was pink in color with traces of blood.  Reports continued nausea, weakness, diffuse abdominal pain, as well as a mild cough.  No rhinorrhea, sore throat.  No diarrhea or fevers.  States he took 800 mg of ibuprofen yesterday for back pain but typically does not take ibuprofen.  Denies any significant alcohol use  Physical Exam  BP 127/73 (BP Location: Right Arm)   Pulse 77   Temp 98.8 F (37.1 C) (Oral)   Resp 18   SpO2 98%  Gen:   Awake, no distress   Resp:  Normal effort  MSK:   Moves extremities without difficulty  Other:    Medical Decision Making  Medically screening exam initiated at 3:22 PM.  Appropriate orders placed.  [name] was informed that the remainder of the evaluation will be completed by another provider, this initial triage assessment does not replace that evaluation, and the importance of remaining in the ED until their evaluation is complete.   Rayna Sexton, PA-C 08/20/20 1524    Little, [name], MD 08/20/20 [phone]

## 2020-08-20 NOTE — ED Triage Notes (Signed)
Patient reports N/V with weakness since last night.

## 2021-01-23 DIAGNOSIS — M25551 Pain in right hip: Secondary | ICD-10-CM | POA: Diagnosis not present

## 2021-01-23 DIAGNOSIS — E669 Obesity, unspecified: Secondary | ICD-10-CM | POA: Diagnosis not present

## 2021-01-23 DIAGNOSIS — Z6833 Body mass index (BMI) 33.0-33.9, adult: Secondary | ICD-10-CM | POA: Diagnosis not present

## 2021-01-23 DIAGNOSIS — I2510 Atherosclerotic heart disease of native coronary artery without angina pectoris: Secondary | ICD-10-CM | POA: Diagnosis not present

## 2021-01-23 DIAGNOSIS — E119 Type 2 diabetes mellitus without complications: Secondary | ICD-10-CM | POA: Diagnosis not present

## 2021-01-23 DIAGNOSIS — M159 Polyosteoarthritis, unspecified: Secondary | ICD-10-CM | POA: Diagnosis not present

## 2021-01-23 DIAGNOSIS — E1140 Type 2 diabetes mellitus with diabetic neuropathy, unspecified: Secondary | ICD-10-CM | POA: Diagnosis not present

## 2021-02-06 DIAGNOSIS — Z6833 Body mass index (BMI) 33.0-33.9, adult: Secondary | ICD-10-CM | POA: Diagnosis not present

## 2021-02-06 DIAGNOSIS — E1140 Type 2 diabetes mellitus with diabetic neuropathy, unspecified: Secondary | ICD-10-CM | POA: Diagnosis not present

## 2021-02-06 DIAGNOSIS — E119 Type 2 diabetes mellitus without complications: Secondary | ICD-10-CM | POA: Diagnosis not present

## 2021-02-06 DIAGNOSIS — I2510 Atherosclerotic heart disease of native coronary artery without angina pectoris: Secondary | ICD-10-CM | POA: Diagnosis not present

## 2021-02-06 DIAGNOSIS — M25551 Pain in right hip: Secondary | ICD-10-CM | POA: Diagnosis not present

## 2021-02-06 DIAGNOSIS — I10 Essential (primary) hypertension: Secondary | ICD-10-CM | POA: Diagnosis not present

## 2021-02-06 DIAGNOSIS — E669 Obesity, unspecified: Secondary | ICD-10-CM | POA: Diagnosis not present

## 2021-02-06 DIAGNOSIS — M159 Polyosteoarthritis, unspecified: Secondary | ICD-10-CM | POA: Diagnosis not present

## 2021-02-06 DIAGNOSIS — E7800 Pure hypercholesterolemia, unspecified: Secondary | ICD-10-CM | POA: Diagnosis not present

## 2021-02-28 DIAGNOSIS — I2510 Atherosclerotic heart disease of native coronary artery without angina pectoris: Secondary | ICD-10-CM | POA: Diagnosis not present

## 2021-02-28 DIAGNOSIS — E119 Type 2 diabetes mellitus without complications: Secondary | ICD-10-CM | POA: Diagnosis not present

## 2021-02-28 DIAGNOSIS — Z6832 Body mass index (BMI) 32.0-32.9, adult: Secondary | ICD-10-CM | POA: Diagnosis not present

## 2021-02-28 DIAGNOSIS — E291 Testicular hypofunction: Secondary | ICD-10-CM | POA: Diagnosis not present

## 2021-02-28 DIAGNOSIS — E1140 Type 2 diabetes mellitus with diabetic neuropathy, unspecified: Secondary | ICD-10-CM | POA: Diagnosis not present

## 2021-02-28 DIAGNOSIS — M25551 Pain in right hip: Secondary | ICD-10-CM | POA: Diagnosis not present

## 2021-02-28 DIAGNOSIS — E669 Obesity, unspecified: Secondary | ICD-10-CM | POA: Diagnosis not present

## 2021-03-27 DIAGNOSIS — M159 Polyosteoarthritis, unspecified: Secondary | ICD-10-CM | POA: Diagnosis not present

## 2021-03-27 DIAGNOSIS — M25551 Pain in right hip: Secondary | ICD-10-CM | POA: Diagnosis not present

## 2021-03-27 DIAGNOSIS — I10 Essential (primary) hypertension: Secondary | ICD-10-CM | POA: Diagnosis not present

## 2021-03-27 DIAGNOSIS — E119 Type 2 diabetes mellitus without complications: Secondary | ICD-10-CM | POA: Diagnosis not present

## 2021-03-27 DIAGNOSIS — I2510 Atherosclerotic heart disease of native coronary artery without angina pectoris: Secondary | ICD-10-CM | POA: Diagnosis not present

## 2021-04-24 DIAGNOSIS — I2510 Atherosclerotic heart disease of native coronary artery without angina pectoris: Secondary | ICD-10-CM | POA: Diagnosis not present

## 2021-04-24 DIAGNOSIS — M25551 Pain in right hip: Secondary | ICD-10-CM | POA: Diagnosis not present

## 2021-04-24 DIAGNOSIS — M159 Polyosteoarthritis, unspecified: Secondary | ICD-10-CM | POA: Diagnosis not present

## 2021-04-24 DIAGNOSIS — E119 Type 2 diabetes mellitus without complications: Secondary | ICD-10-CM | POA: Diagnosis not present

## 2021-04-24 DIAGNOSIS — E1140 Type 2 diabetes mellitus with diabetic neuropathy, unspecified: Secondary | ICD-10-CM | POA: Diagnosis not present

## 2021-05-22 DIAGNOSIS — M159 Polyosteoarthritis, unspecified: Secondary | ICD-10-CM | POA: Diagnosis not present

## 2021-05-22 DIAGNOSIS — I10 Essential (primary) hypertension: Secondary | ICD-10-CM | POA: Diagnosis not present

## 2021-05-22 DIAGNOSIS — E119 Type 2 diabetes mellitus without complications: Secondary | ICD-10-CM | POA: Diagnosis not present

## 2021-05-22 DIAGNOSIS — E7800 Pure hypercholesterolemia, unspecified: Secondary | ICD-10-CM | POA: Diagnosis not present

## 2021-05-22 DIAGNOSIS — M25551 Pain in right hip: Secondary | ICD-10-CM | POA: Diagnosis not present

## 2021-06-23 DIAGNOSIS — M25551 Pain in right hip: Secondary | ICD-10-CM | POA: Diagnosis not present

## 2021-06-23 DIAGNOSIS — E119 Type 2 diabetes mellitus without complications: Secondary | ICD-10-CM | POA: Diagnosis not present

## 2021-06-23 DIAGNOSIS — I10 Essential (primary) hypertension: Secondary | ICD-10-CM | POA: Diagnosis not present

## 2021-06-23 DIAGNOSIS — I2510 Atherosclerotic heart disease of native coronary artery without angina pectoris: Secondary | ICD-10-CM | POA: Diagnosis not present

## 2021-06-23 DIAGNOSIS — E7800 Pure hypercholesterolemia, unspecified: Secondary | ICD-10-CM | POA: Diagnosis not present

## 2021-07-31 DIAGNOSIS — E7800 Pure hypercholesterolemia, unspecified: Secondary | ICD-10-CM | POA: Diagnosis not present

## 2021-07-31 DIAGNOSIS — M25551 Pain in right hip: Secondary | ICD-10-CM | POA: Diagnosis not present

## 2021-07-31 DIAGNOSIS — E119 Type 2 diabetes mellitus without complications: Secondary | ICD-10-CM | POA: Diagnosis not present

## 2021-07-31 DIAGNOSIS — I10 Essential (primary) hypertension: Secondary | ICD-10-CM | POA: Diagnosis not present

## 2021-07-31 DIAGNOSIS — M159 Polyosteoarthritis, unspecified: Secondary | ICD-10-CM | POA: Diagnosis not present

## 2021-08-28 DIAGNOSIS — I2510 Atherosclerotic heart disease of native coronary artery without angina pectoris: Secondary | ICD-10-CM | POA: Diagnosis not present

## 2021-08-28 DIAGNOSIS — I10 Essential (primary) hypertension: Secondary | ICD-10-CM | POA: Diagnosis not present

## 2021-08-28 DIAGNOSIS — E7800 Pure hypercholesterolemia, unspecified: Secondary | ICD-10-CM | POA: Diagnosis not present

## 2021-08-28 DIAGNOSIS — E119 Type 2 diabetes mellitus without complications: Secondary | ICD-10-CM | POA: Diagnosis not present

## 2021-08-28 DIAGNOSIS — M25551 Pain in right hip: Secondary | ICD-10-CM | POA: Diagnosis not present

## 2021-09-03 ENCOUNTER — Emergency Department (HOSPITAL_COMMUNITY)
Admission: EM | Admit: 2021-09-03 | Discharge: 2021-09-04 | Disposition: A | Discharge status: Home / Self Care | Payer: BC Managed Care – PPO | Attending: Emergency Medicine | Admitting: Emergency Medicine

## 2021-09-03 ENCOUNTER — Other Ambulatory Visit: Payer: Self-pay

## 2021-09-03 ENCOUNTER — Emergency Department (HOSPITAL_COMMUNITY): Payer: BC Managed Care – PPO

## 2021-09-03 ENCOUNTER — Encounter (HOSPITAL_COMMUNITY): Payer: Self-pay

## 2021-09-03 DIAGNOSIS — Z20822 Contact with and (suspected) exposure to covid-19: Secondary | ICD-10-CM | POA: Diagnosis not present

## 2021-09-03 DIAGNOSIS — Z7982 Long term (current) use of aspirin: Secondary | ICD-10-CM | POA: Diagnosis not present

## 2021-09-03 DIAGNOSIS — H539 Unspecified visual disturbance: Secondary | ICD-10-CM

## 2021-09-03 DIAGNOSIS — R739 Hyperglycemia, unspecified: Secondary | ICD-10-CM

## 2021-09-03 DIAGNOSIS — Z7984 Long term (current) use of oral hypoglycemic drugs: Secondary | ICD-10-CM | POA: Diagnosis not present

## 2021-09-03 DIAGNOSIS — I2510 Atherosclerotic heart disease of native coronary artery without angina pectoris: Secondary | ICD-10-CM | POA: Diagnosis not present

## 2021-09-03 DIAGNOSIS — E1165 Type 2 diabetes mellitus with hyperglycemia: Secondary | ICD-10-CM | POA: Insufficient documentation

## 2021-09-03 DIAGNOSIS — I10 Essential (primary) hypertension: Secondary | ICD-10-CM | POA: Insufficient documentation

## 2021-09-03 DIAGNOSIS — Z79899 Other long term (current) drug therapy: Secondary | ICD-10-CM | POA: Diagnosis not present

## 2021-09-03 DIAGNOSIS — H532 Diplopia: Secondary | ICD-10-CM | POA: Diagnosis not present

## 2021-09-03 DIAGNOSIS — Z794 Long term (current) use of insulin: Secondary | ICD-10-CM | POA: Insufficient documentation

## 2021-09-03 DIAGNOSIS — R350 Frequency of micturition: Secondary | ICD-10-CM | POA: Diagnosis not present

## 2021-09-03 LAB — COMPREHENSIVE METABOLIC PANEL
ALT: 14 U/L (ref 0–44)
AST: 14 U/L — ABNORMAL LOW (ref 15–41)
Albumin: 3.7 g/dL (ref 3.5–5.0)
Alkaline Phosphatase: 52 U/L (ref 38–126)
Anion gap: 8 (ref 5–15)
BUN: 20 mg/dL (ref 6–20)
CO2: 22 mmol/L (ref 22–32)
Calcium: 9.2 mg/dL (ref 8.9–10.3)
Chloride: 105 mmol/L (ref 98–111)
Creatinine, Ser: 1.13 mg/dL (ref 0.61–1.24)
GFR, Estimated: >60 mL/min (ref >60)
Glucose, Bld: 360 mg/dL — ABNORMAL HIGH (ref 70–99)
Potassium: 4.1 mmol/L (ref 3.5–5.1)
Sodium: 135 mmol/L (ref 135–145)
Total Bilirubin: 0.5 mg/dL (ref 0.3–1.2)
Total Protein: 7.3 g/dL (ref 6.5–8.1)

## 2021-09-03 LAB — PROTIME-INR
INR: 0.9 (ref 0.8–1.2)
Prothrombin Time: 12.4 seconds (ref 11.4–15.2)

## 2021-09-03 LAB — DIFFERENTIAL
Abs Immature Granulocytes: 0.04 10*3/uL (ref 0.00–0.07)
Basophils Absolute: 0 10*3/uL (ref 0.0–0.1)
Basophils Relative: 0 %
Eosinophils Absolute: 0 10*3/uL (ref 0.0–0.5)
Eosinophils Relative: 0 %
Immature Granulocytes: 1 %
Lymphocytes Relative: 36 %
Lymphs Abs: 2.7 10*3/uL (ref 0.7–4.0)
Monocytes Absolute: 0.5 10*3/uL (ref 0.1–1.0)
Monocytes Relative: 6 %
Neutro Abs: 4.3 10*3/uL (ref 1.7–7.7)
Neutrophils Relative %: 57 %

## 2021-09-03 LAB — I-STAT CHEM 8, ED
BUN: 18 mg/dL (ref 6–20)
Calcium, Ion: 1.18 mmol/L (ref 1.15–1.40)
Chloride: 100 mmol/L (ref 98–111)
Creatinine, Ser: 1 mg/dL (ref 0.61–1.24)
Glucose, Bld: 376 mg/dL — ABNORMAL HIGH (ref 70–99)
HCT: 44 % (ref 39.0–52.0)
Hemoglobin: 15 g/dL (ref 13.0–17.0)
Potassium: 4 mmol/L (ref 3.5–5.1)
Sodium: 136 mmol/L (ref 135–145)
TCO2: 22 mmol/L (ref 22–32)

## 2021-09-03 LAB — RESP PANEL BY RT-PCR (FLU A&B, COVID) ARPGX2
Influenza A by PCR: NEGATIVE
Influenza B by PCR: NEGATIVE
SARS Coronavirus 2 by RT PCR: NEGATIVE

## 2021-09-03 LAB — CBC
HCT: 43.4 % (ref 39.0–52.0)
Hemoglobin: 14.9 g/dL (ref 13.0–17.0)
MCH: 30.6 pg (ref 26.0–34.0)
MCHC: 34.3 g/dL (ref 30.0–36.0)
MCV: 89.1 fL (ref 80.0–100.0)
Platelets: 261 10*3/uL (ref 150–400)
RBC: 4.87 MIL/uL (ref 4.22–5.81)
RDW: 13.2 % (ref 11.5–15.5)
WBC: 7.6 10*3/uL (ref 4.0–10.5)
nRBC: 0 % (ref 0.0–0.2)

## 2021-09-03 LAB — CBG MONITORING, ED: Glucose-Capillary: 396 mg/dL — ABNORMAL HIGH (ref 70–99)

## 2021-09-03 LAB — BLOOD GAS, VENOUS
Acid-Base Excess: 1.3 mmol/L (ref 0.0–2.0)
Bicarbonate: 25.9 mmol/L (ref 20.0–28.0)
O2 Saturation: 93.9 %
Patient temperature: 37
pCO2, Ven: 40 mmHg — ABNORMAL LOW (ref 44–60)
pH, Ven: 7.42 (ref 7.25–7.43)
pO2, Ven: 70 mmHg — ABNORMAL HIGH (ref 32–45)

## 2021-09-03 LAB — BETA-HYDROXYBUTYRIC ACID: Beta-Hydroxybutyric Acid: 0.12 mmol/L (ref 0.05–0.27)

## 2021-09-03 LAB — ETHANOL: Alcohol, Ethyl (B): <10 mg/dL (ref <10)

## 2021-09-03 LAB — OSMOLALITY: Osmolality: 305 mOsm/kg — ABNORMAL HIGH (ref 275–295)

## 2021-09-03 LAB — APTT: aPTT: 26 seconds (ref 24–36)

## 2021-09-03 NOTE — ED Provider Notes (Signed)
  Provider Note MRN:  [id number]  Arrival date & time: 09/04/21    ED Course and Medical Decision Making  Assumed care from Dr. Leonette Monarch at shift change.  Double vision, hyperglycemia, will obtain MRI to exclude signs of ischemic stroke.  Would be a candidate for discharge if reassuring work-up.  Procedures  Final Clinical Impressions(s) / ED Diagnoses     ICD-10-CM   1. Hyperglycemia  R73.9     2. Visual disturbance  H53.9       ED Discharge Orders     None         Discharge Instructions      You were evaluated in the Emergency Department and after careful evaluation, we did not find any emergent condition requiring admission or further testing in the hospital.  Your exam/testing today is overall reassuring.  MRI did not show any abnormalities or stroke.  Recommend follow-up with your regular doctor and an eye specialist if you continue to have vision issues.  Please return to the Emergency Department if you experience any worsening of your condition.   Thank you for allowing [place] to be a part of your care.      Barth Kirks. Sedonia Small, [place] [email or web address]    Maudie Flakes, MD 09/04/21 [phone]

## 2021-09-03 NOTE — ED Triage Notes (Signed)
Reports diplopia, frequent urination, and elevated blood sugars today. DMII. Compliant with '800mg'$  Metformin TID.

## 2021-09-03 NOTE — ED Provider Triage Note (Signed)
Emergency Medicine Provider Triage Evaluation Note  [name] , a 56 y.o. male  was evaluated in triage.  Pt complains of hyperglycemia.  He reports that at 230 he started having blurry vision.  He denies any weakness  He reports compliance with all of his medications.  Normally when he checks his blood sugar it is 120s.  He reports headache.   Didn't strike his head.   No weakness.   He feels like he was a little "slurry" earlier from 6pm-7pm but feels like that has been resolved.    Physical Exam  BP (!) 141/80 (BP Location: Left Arm)   Pulse 83   Temp 97.9 F (36.6 C) (Oral)   Resp 16   Ht '6\' 2"'$  (1.88 m)   Wt 108.9 kg   SpO2 100%   BMI 30.81 kg/m  Gen:   Awake, no distress   Resp:  Normal effort  MSK:   Moves extremities without difficulty  Other:  No visual field cut.  Blurriness with both eyes, does not resolve with covering 1 eye.  Pupils are equal round reactive to light.  Speech is not slurred.  No facial droop.  5/5 strength bilateral arms and legs.  Coordination is grossly intact.  No pronator drift bilaterally.  Medical Decision Making  Medically screening exam initiated at 9:20 PM.  Appropriate orders placed.  Kregg Cihlar was informed that the remainder of the evaluation will be completed by another provider, this initial triage assessment does not replace that evaluation, and the importance of remaining in the ED until their evaluation is complete.  Patient here with sudden onset of blurry vision.  He does have elevated sugars and a headache. He states that he felt like his speech was slurring earlier but that resolved. Given his last known well is more than 4-1/2 hours ago he is outside stroke window. He does not meet LVO criteria as he does not have any weakness, true visual field cut. Will scan his head given that he has a headache with blurry vision, along with obtaining stroke type labs and hyperglycemia labs.   Lorin Glass, [place] 09/03/21 2135

## 2021-09-04 ENCOUNTER — Emergency Department (HOSPITAL_COMMUNITY): Payer: BC Managed Care – PPO

## 2021-09-04 DIAGNOSIS — H532 Diplopia: Secondary | ICD-10-CM | POA: Diagnosis not present

## 2021-09-04 LAB — URINALYSIS, ROUTINE W REFLEX MICROSCOPIC
Bacteria, UA: NONE SEEN
Bilirubin Urine: NEGATIVE
Glucose, UA: >=500 mg/dL — AB
Hgb urine dipstick: NEGATIVE
Ketones, ur: NEGATIVE mg/dL
Leukocytes,Ua: NEGATIVE
Nitrite: NEGATIVE
Protein, ur: NEGATIVE mg/dL
Specific Gravity, Urine: 1.021 (ref 1.005–1.030)
pH: 5 (ref 5.0–8.0)

## 2021-09-04 LAB — RAPID URINE DRUG SCREEN, HOSP PERFORMED
Amphetamines: NOT DETECTED
Barbiturates: NOT DETECTED
Benzodiazepines: NOT DETECTED
Cocaine: NOT DETECTED
Opiates: NOT DETECTED
Tetrahydrocannabinol: NOT DETECTED

## 2021-09-04 MED ORDER — SODIUM CHLORIDE 0.9 % IV BOLUS
1000.0000 mL | Freq: Once | INTRAVENOUS | Status: AC
  Administered 2021-09-04: 1000 mL via INTRAVENOUS

## 2021-09-04 NOTE — Discharge Instructions (Signed)
You were evaluated in the Emergency Department and after careful evaluation, we did not find any emergent condition requiring admission or further testing in the hospital.  Your exam/testing today is overall reassuring.  MRI did not show any abnormalities or stroke.  Recommend follow-up with your regular doctor and an eye specialist if you continue to have vision issues.  Please return to the Emergency Department if you experience any worsening of your condition.   Thank you for allowing [place] to be a part of your care.

## 2021-09-04 NOTE — ED Provider Notes (Signed)
[place] DEPT Provider Note  CSN: [id number] Arrival date & time: 09/03/21 2049  Chief Complaint(s) Hyperglycemia  HPI [name] is a 56 y.o. male with a past medical history listed below who presents to the emergency department with elevated blood sugars and diplopia.  He reports that the symptoms began sometime earlier in the day.  He reports that the diplopia is something he had in the past when he was first diagnosed with diabetes.  States that he has been compliant with his home medication.  Denies any recent fevers or infections.  No coughing or congestion.  No nausea or vomiting.  No abdominal pain.  No focal weakness, loss of sensation.  No other physical complaints.  The history is provided by the patient.    Past Medical History Past Medical History:  Diagnosis Date   Depression    Diabetes mellitus    High cholesterol    Hypertension    Patient Active Problem List   Diagnosis Date Noted   CAD (coronary artery disease) 08/04/2016   Vertigo 08/04/2016   Chest pain 06/25/2015   Diabetes mellitus 06/25/2015   High cholesterol 06/25/2015   Hypertension 06/25/2015   Pain in the chest    Type 2 diabetes mellitus without complication, without long-term current use of insulin ([place])    Home Medication(s) Prior to Admission medications   Medication Sig Start Date End Date Taking? Authorizing Provider  acetaminophen (TYLENOL) 325 MG tablet Take 2 tablets (650 mg total) by mouth every 4 (four) hours as needed for headache or mild pain. 03/07/20   Matilde Haymaker, MD  aspirin EC 325 MG EC tablet Take 1 tablet (325 mg total) by mouth daily. 06/28/15   Nita Sells, MD  atorvastatin (LIPITOR) 80 MG tablet Take 1 tablet (80 mg total) by mouth daily at 6 PM. Patient not taking: No sig reported 06/28/15   Nita Sells, MD  FARXIGA 10 MG TABS tablet Take 10 mg by mouth daily. 11/20/15   [provider]  glimepiride (AMARYL) 4 MG  tablet Take 4 mg by mouth 2 (two) times daily.    [provider]  JANUVIA 100 MG tablet Take 100 mg by mouth daily. 07/15/16   [provider]  lidocaine (LIDODERM) 5 % Place 1 patch onto the skin daily. Remove & Discard patch within 12 hours or as directed by MD 03/07/20   Matilde Haymaker, MD  lisinopril (PRINIVIL,ZESTRIL) 40 MG tablet Take 40 mg by mouth daily.  01/17/15   [provider]  metFORMIN (GLUCOPHAGE) 850 MG tablet Take 1 tablet (850 mg total) by mouth 3 (three) times daily. 07/03/13   Piepenbrink, Anderson Malta, PA-C  metoprolol tartrate (LOPRESSOR) 25 MG tablet Take 1 tablet (25 mg total) by mouth 2 (two) times daily. 06/28/15   Nita Sells, MD  pantoprazole (PROTONIX) 40 MG tablet Take 40 mg by mouth daily. 02/18/20   [provider]  Allergies Patient has no known allergies.  Review of Systems Review of Systems As noted in HPI  Physical Exam Vital Signs  I have reviewed the triage vital signs BP 109/71   Pulse 80   Temp 97.9 F (36.6 C) (Oral)   Resp 17   Ht '6\' 2"'$  (1.88 m)   Wt 108.9 kg   SpO2 99%   BMI 30.81 kg/m   Physical Exam Vitals reviewed.  Constitutional:      General: He is not in acute distress.    Appearance: He is well-developed. He is not diaphoretic.  HENT:     Head: Normocephalic and atraumatic.     Nose: Nose normal.  Eyes:     General: No scleral icterus.       Right eye: No discharge.        Left eye: No discharge.     Conjunctiva/sclera: Conjunctivae normal.     Pupils: Pupils are equal, round, and reactive to light.  Cardiovascular:     Rate and Rhythm: Normal rate and regular rhythm.     Heart sounds: No murmur heard.    No friction rub. No gallop.  Pulmonary:     Effort: Pulmonary effort is normal. No respiratory distress.     Breath sounds: Normal breath sounds. No  stridor. No rales.  Abdominal:     General: There is no distension.     Palpations: Abdomen is soft.     Tenderness: There is no abdominal tenderness.  Musculoskeletal:        General: No tenderness.     Cervical back: Normal range of motion and neck supple.  Skin:    General: Skin is warm and dry.     Findings: No erythema or rash.  Neurological:     Mental Status: He is alert and oriented to person, place, and time.     Comments: Mental Status:  Alert and oriented to person, place, and time.  Attention and concentration normal.  Speech clear.  Recent memory is intact  Cranial Nerves:  II Visual Fields: Intact to confrontation. Patient reports monocular diplopia with both eyes L>R III, IV, VI: Pupils equal and reactive to light and near. Full eye movement without nystagmus  V Facial Sensation: Normal. No weakness of masticatory muscles  VII: No facial weakness or asymmetry  VIII Auditory Acuity: Grossly normal  IX/X: The uvula is midline; the palate elevates symmetrically  XI: Normal sternocleidomastoid and trapezius strength  XII: The tongue is midline. No atrophy or fasciculations.   Motor System: Muscle Strength: 5/5 and symmetric in the upper and lower extremities. No pronation or drift.  Muscle Tone: Tone and muscle bulk are normal in the upper and lower extremities.  Coordination: Intact finger-to-nose. No tremor.  Sensation: Intact to light touch. Gait: deferred      ED Results and Treatments Labs (all labs ordered are listed, but only abnormal results are displayed) Labs Reviewed  COMPREHENSIVE METABOLIC PANEL - Abnormal; Notable for the following components:      Result Value   Glucose, Bld 360 (*)    AST 14 (*)    All other components within normal limits  BLOOD GAS, VENOUS - Abnormal; Notable for the following components:   pCO2, Ven 40 (*)    pO2, Ven 70 (*)    All other components within normal limits  OSMOLALITY - Abnormal; Notable for the following  components:   Osmolality 305 (*)    All other components within normal limits  CBG MONITORING, ED - Abnormal; Notable for the following components:   Glucose-Capillary 396 (*)    All other components within normal limits  I-STAT CHEM 8, ED - Abnormal; Notable for the following components:   Glucose, Bld 376 (*)    All other components within normal limits  RESP PANEL BY RT-PCR (FLU A&B, COVID) ARPGX2  ETHANOL  PROTIME-INR  APTT  CBC  DIFFERENTIAL  BETA-HYDROXYBUTYRIC ACID  RAPID URINE DRUG SCREEN, HOSP PERFORMED  URINALYSIS, ROUTINE W REFLEX MICROSCOPIC                                                                                                                         EKG  EKG Interpretation  Date/Time:    Ventricular Rate:    PR Interval:    QRS Duration:   QT Interval:    QTC Calculation:   R Axis:     Text Interpretation:         Radiology CT HEAD WO CONTRAST  Result Date: 09/03/2021 CLINICAL DATA:  Diplopia and frequent urination with elevated blood sugars. EXAM: CT HEAD WITHOUT CONTRAST TECHNIQUE: Contiguous axial images were obtained from the base of the skull through the vertex without intravenous contrast. RADIATION DOSE REDUCTION: This exam was performed according to the departmental dose-optimization program which includes automated exposure control, adjustment of the mA and/or kV according to patient size and/or use of iterative reconstruction technique. COMPARISON:  August 03, 2016 FINDINGS: Brain: No evidence of acute infarction, hemorrhage, hydrocephalus, extra-axial collection or mass lesion/mass effect. Vascular: No hyperdense vessel or unexpected calcification. Skull: Normal. Negative for fracture or focal lesion. Sinuses/Orbits: No acute finding. Chronic partial opacification of the right mastoid air cells is noted. Other: None. IMPRESSION: 1. No acute intracranial abnormality. 2. Chronic partial opacification of the right mastoid air cells. Electronically Signed    By: Virgina Norfolk M.D.   On: 09/03/2021 23:23    Pertinent labs & imaging results that were available during my care of the patient were reviewed by me and considered in my medical decision making (see MDM for details).  Medications Ordered in ED Medications  sodium chloride 0.9 % bolus 1,000 mL (has no administration in time range)                                                                                                                                     Procedures Procedures  (  including critical care time)  Medical Decision Making / ED Course    Complexity of Problem:  Co-morbidities/SDOH that complicate the patient evaluation/care: Noted above in HPI  Patient's presenting problem/concern, DDX, and MDM listed below: Diplopia No other neurologic deficits noted on exam. Possible cranial nerve palsy but will need to rule out stroke. Hyperglycemia We will obtain labs to assess for any electrolyte/metabolic derangements or evidence of DKA We will also assess for infection.  Hospitalization Considered:  yes  Initial Intervention:  IVF    Complexity of Data:    Laboratory Tests ordered listed below with my independent interpretation: CBC without leukocytosis or anemia Metabolic panel with hyperglycemia without evidence of DKA.  No other significant electrolyte derangement or renal sufficiency.   Imaging Studies ordered listed below with my independent interpretation: CT head negative for ICH or mass effect     ED Course:    Assessment, Add'l Intervention, and Reassessment: Diplopia Pending MRI to rule out stroke Hyperglycemia No evidence of DKA or HHS Being provided with IV fluids Patient care turned over to oncoming provider. Patient case and results discussed in detail; please see their note for further ED managment.    Final Clinical Impression(s) / ED Diagnoses Final diagnoses:  None           This chart was dictated using voice  recognition software.  Despite best efforts to proofread,  errors can occur which can change the documentation meaning.    Fatima Blank, MD 09/04/21 [phone]

## 2021-09-04 NOTE — ED Notes (Signed)
Pt taken to MRI. Will complete EKG upon return. [employer]

## 2021-09-25 DIAGNOSIS — M25551 Pain in right hip: Secondary | ICD-10-CM | POA: Diagnosis not present

## 2021-09-25 DIAGNOSIS — E1169 Type 2 diabetes mellitus with other specified complication: Secondary | ICD-10-CM | POA: Diagnosis not present

## 2021-09-25 DIAGNOSIS — I10 Essential (primary) hypertension: Secondary | ICD-10-CM | POA: Diagnosis not present

## 2021-09-25 DIAGNOSIS — E875 Hyperkalemia: Secondary | ICD-10-CM | POA: Diagnosis not present

## 2021-09-25 DIAGNOSIS — E785 Hyperlipidemia, unspecified: Secondary | ICD-10-CM | POA: Diagnosis not present

## 2021-09-25 DIAGNOSIS — R5382 Chronic fatigue, unspecified: Secondary | ICD-10-CM | POA: Diagnosis not present

## 2021-09-25 DIAGNOSIS — E1140 Type 2 diabetes mellitus with diabetic neuropathy, unspecified: Secondary | ICD-10-CM | POA: Diagnosis not present

## 2021-10-09 DIAGNOSIS — I10 Essential (primary) hypertension: Secondary | ICD-10-CM | POA: Diagnosis not present

## 2021-10-09 DIAGNOSIS — E1169 Type 2 diabetes mellitus with other specified complication: Secondary | ICD-10-CM | POA: Diagnosis not present

## 2021-10-09 DIAGNOSIS — M25551 Pain in right hip: Secondary | ICD-10-CM | POA: Diagnosis not present

## 2021-10-09 DIAGNOSIS — E785 Hyperlipidemia, unspecified: Secondary | ICD-10-CM | POA: Diagnosis not present

## 2021-10-23 DIAGNOSIS — N1831 Chronic kidney disease, stage 3a: Secondary | ICD-10-CM | POA: Diagnosis not present

## 2021-10-23 DIAGNOSIS — E1169 Type 2 diabetes mellitus with other specified complication: Secondary | ICD-10-CM | POA: Diagnosis not present

## 2021-10-23 DIAGNOSIS — M25551 Pain in right hip: Secondary | ICD-10-CM | POA: Diagnosis not present

## 2021-10-23 DIAGNOSIS — E875 Hyperkalemia: Secondary | ICD-10-CM | POA: Diagnosis not present

## 2021-11-02 ENCOUNTER — Emergency Department (HOSPITAL_COMMUNITY)
Admission: EM | Admit: 2021-11-02 | Discharge: 2021-11-02 | Disposition: A | Discharge status: Home / Self Care | Payer: BC Managed Care – PPO | Attending: Emergency Medicine | Admitting: Emergency Medicine

## 2021-11-02 ENCOUNTER — Emergency Department (HOSPITAL_COMMUNITY): Payer: BC Managed Care – PPO

## 2021-11-02 ENCOUNTER — Other Ambulatory Visit: Payer: Self-pay

## 2021-11-02 DIAGNOSIS — Z7982 Long term (current) use of aspirin: Secondary | ICD-10-CM | POA: Insufficient documentation

## 2021-11-02 DIAGNOSIS — I10 Essential (primary) hypertension: Secondary | ICD-10-CM | POA: Insufficient documentation

## 2021-11-02 DIAGNOSIS — Z20822 Contact with and (suspected) exposure to covid-19: Secondary | ICD-10-CM | POA: Insufficient documentation

## 2021-11-02 DIAGNOSIS — R739 Hyperglycemia, unspecified: Secondary | ICD-10-CM

## 2021-11-02 DIAGNOSIS — Z7984 Long term (current) use of oral hypoglycemic drugs: Secondary | ICD-10-CM | POA: Diagnosis not present

## 2021-11-02 DIAGNOSIS — Z79899 Other long term (current) drug therapy: Secondary | ICD-10-CM | POA: Diagnosis not present

## 2021-11-02 DIAGNOSIS — E1165 Type 2 diabetes mellitus with hyperglycemia: Secondary | ICD-10-CM | POA: Diagnosis present

## 2021-11-02 DIAGNOSIS — I2510 Atherosclerotic heart disease of native coronary artery without angina pectoris: Secondary | ICD-10-CM | POA: Diagnosis not present

## 2021-11-02 LAB — CBC WITH DIFFERENTIAL/PLATELET
Abs Immature Granulocytes: 0.08 10*3/uL — ABNORMAL HIGH (ref 0.00–0.07)
Basophils Absolute: 0 10*3/uL (ref 0.0–0.1)
Basophils Relative: 0 %
Eosinophils Absolute: 0 10*3/uL (ref 0.0–0.5)
Eosinophils Relative: 0 %
HCT: 39.7 % (ref 39.0–52.0)
Hemoglobin: 13.7 g/dL (ref 13.0–17.0)
Immature Granulocytes: 1 %
Lymphocytes Relative: 30 %
Lymphs Abs: 2.7 10*3/uL (ref 0.7–4.0)
MCH: 31 pg (ref 26.0–34.0)
MCHC: 34.5 g/dL (ref 30.0–36.0)
MCV: 89.8 fL (ref 80.0–100.0)
Monocytes Absolute: 0.5 10*3/uL (ref 0.1–1.0)
Monocytes Relative: 6 %
Neutro Abs: 5.7 10*3/uL (ref 1.7–7.7)
Neutrophils Relative %: 63 %
Platelets: 226 10*3/uL (ref 150–400)
RBC: 4.42 MIL/uL (ref 4.22–5.81)
RDW: 13.2 % (ref 11.5–15.5)
WBC: 9.1 10*3/uL (ref 4.0–10.5)
nRBC: 0 % (ref 0.0–0.2)

## 2021-11-02 LAB — URINALYSIS, COMPLETE (UACMP) WITH MICROSCOPIC
Bacteria, UA: NONE SEEN
Bilirubin Urine: NEGATIVE
Glucose, UA: >=500 mg/dL — AB
Hgb urine dipstick: NEGATIVE
Ketones, ur: NEGATIVE mg/dL
Leukocytes,Ua: NEGATIVE
Nitrite: NEGATIVE
Protein, ur: NEGATIVE mg/dL
Specific Gravity, Urine: 1.021 (ref 1.005–1.030)
pH: 5 (ref 5.0–8.0)

## 2021-11-02 LAB — BASIC METABOLIC PANEL
Anion gap: 5 (ref 5–15)
BUN: 25 mg/dL — ABNORMAL HIGH (ref 6–20)
CO2: 22 mmol/L (ref 22–32)
Calcium: 9.2 mg/dL (ref 8.9–10.3)
Chloride: 108 mmol/L (ref 98–111)
Creatinine, Ser: 1.17 mg/dL (ref 0.61–1.24)
GFR, Estimated: >60 mL/min (ref >60)
Glucose, Bld: 358 mg/dL — ABNORMAL HIGH (ref 70–99)
Potassium: 4.2 mmol/L (ref 3.5–5.1)
Sodium: 135 mmol/L (ref 135–145)

## 2021-11-02 LAB — BLOOD GAS, VENOUS
Acid-base deficit: 2.9 mmol/L — ABNORMAL HIGH (ref 0.0–2.0)
Bicarbonate: 20.9 mmol/L (ref 20.0–28.0)
O2 Saturation: 96.7 %
Patient temperature: 37
pCO2, Ven: 33 mmHg — ABNORMAL LOW (ref 44–60)
pH, Ven: 7.41 (ref 7.25–7.43)
pO2, Ven: 81 mmHg — ABNORMAL HIGH (ref 32–45)

## 2021-11-02 LAB — RESP PANEL BY RT-PCR (FLU A&B, COVID) ARPGX2
Influenza A by PCR: NEGATIVE
Influenza B by PCR: NEGATIVE
SARS Coronavirus 2 by RT PCR: NEGATIVE

## 2021-11-02 LAB — LACTIC ACID, PLASMA: Lactic Acid, Venous: 1.7 mmol/L (ref 0.5–1.9)

## 2021-11-02 LAB — CBG MONITORING, ED
Glucose-Capillary: 334 mg/dL — ABNORMAL HIGH (ref 70–99)
Glucose-Capillary: 272 mg/dL — ABNORMAL HIGH (ref 70–99)

## 2021-11-02 LAB — BETA-HYDROXYBUTYRIC ACID: Beta-Hydroxybutyric Acid: 0.21 mmol/L (ref 0.05–0.27)

## 2021-11-02 MED ORDER — BLOOD GLUCOSE MONITOR KIT: PACK | 0 refills | Status: DC

## 2021-11-02 MED ORDER — LACTATED RINGERS IV BOLUS
20.0000 mL/kg | Freq: Once | INTRAVENOUS | Status: AC
  Administered 2021-11-02: 2032 mL via INTRAVENOUS

## 2021-11-02 MED ORDER — ACETAMINOPHEN 325 MG PO TABS
650.0000 mg | ORAL_TABLET | Freq: Once | ORAL | Status: AC
  Administered 2021-11-02: 650 mg via ORAL
  Filled 2021-11-02: qty 2

## 2021-11-02 NOTE — Discharge Instructions (Signed)
You were seen in the emergency department for your high blood sugar.  You had no obvious source of an infection and no signs of complication from her sugar being high.  You may have a viral infection or it could be related to the recent steroid injection that you had.  You should continue to take your medications as prescribed and check your blood sugar daily.  You should follow-up with your primary doctor in the next few days to have your symptoms rechecked and to determine if you need any changes to your medication.  You should return to the emergency department if you have abdominal pain, repetitive vomiting, confusion, or if you have any other new or concerning symptoms.

## 2021-11-02 NOTE — ED Provider Notes (Signed)
[facility] DEPT Provider Note   CSN: [id number] Arrival date & time: 11/02/21  1739     History  Chief Complaint  Patient presents with   Hyperglycemia    [name] is a 56 y.o. male.  Patient is a 56 year old male with a past medical history of diabetes, hypertension and CAD presenting to the emergency department with hyperglycemia.  The patient states that he has had a recent headache with body aches as well as a cough and diarrhea.  He denies any fevers or chills, abdominal pain, nausea or vomiting.  He states that he called 911 today and his blood sugar for them was 500.  States he does not have a glucometer at home to check his sugars.  He also reports that he had a recent steroid injection for hip pain.  States he has been taking his medications as prescribed.  The history is provided by the patient.  Hyperglycemia      Home Medications Prior to Admission medications   Medication Sig Start Date End Date Taking? Authorizing Provider  acetaminophen (TYLENOL) 325 MG tablet Take 2 tablets (650 mg total) by mouth every 4 (four) hours as needed for headache or mild pain. 03/07/20   Matilde Haymaker, MD  aspirin EC 325 MG EC tablet Take 1 tablet (325 mg total) by mouth daily. 06/28/15   Nita Sells, MD  atorvastatin (LIPITOR) 80 MG tablet Take 1 tablet (80 mg total) by mouth daily at 6 PM. Patient not taking: No sig reported 06/28/15   Nita Sells, MD  FARXIGA 10 MG TABS tablet Take 10 mg by mouth daily. 11/20/15   [provider]  glimepiride (AMARYL) 4 MG tablet Take 4 mg by mouth 2 (two) times daily.    [provider]  JANUVIA 100 MG tablet Take 100 mg by mouth daily. 07/15/16   [provider]  lidocaine (LIDODERM) 5 % Place 1 patch onto the skin daily. Remove & Discard patch within 12 hours or as directed by MD 03/07/20   Matilde Haymaker, MD  lisinopril (PRINIVIL,ZESTRIL) 40 MG tablet Take 40 mg by mouth daily.   01/17/15   [provider]  metFORMIN (GLUCOPHAGE) 850 MG tablet Take 1 tablet (850 mg total) by mouth 3 (three) times daily. 07/03/13   Piepenbrink, Anderson Malta, PA-C  metoprolol tartrate (LOPRESSOR) 25 MG tablet Take 1 tablet (25 mg total) by mouth 2 (two) times daily. 06/28/15   Nita Sells, MD  pantoprazole (PROTONIX) 40 MG tablet Take 40 mg by mouth daily. 02/18/20   [provider]      Allergies    Patient has no known allergies.    Review of Systems   Review of Systems  Physical Exam Updated Vital Signs BP 136/77   Pulse (!) 54   Temp 98.1 F (36.7 C) (Oral)   Resp (!) 24   Ht '6\' 2"'$  (1.88 m)   Wt 101.6 kg   SpO2 99%   BMI 28.76 kg/m  Physical Exam Vitals and nursing note reviewed.  Constitutional:      General: He is not in acute distress.    Appearance: Normal appearance.  HENT:     Head: Normocephalic and atraumatic.     Nose: Nose normal.     Mouth/Throat:     Mouth: Mucous membranes are moist.     Pharynx: Oropharynx is clear.  Eyes:     Extraocular Movements: Extraocular movements intact.     Conjunctiva/sclera: Conjunctivae normal.  Cardiovascular:  Rate and Rhythm: Normal rate and regular rhythm.     Pulses: Normal pulses.     Heart sounds: Normal heart sounds.  Pulmonary:     Effort: Pulmonary effort is normal.     Breath sounds: Normal breath sounds.  Abdominal:     General: Abdomen is flat.     Palpations: Abdomen is soft.     Tenderness: There is no abdominal tenderness.  Musculoskeletal:        General: Normal range of motion.     Cervical back: Normal range of motion and neck supple. No rigidity.     Right lower leg: No edema.     Left lower leg: No edema.  Skin:    General: Skin is warm and dry.  Neurological:     General: No focal deficit present.     Mental Status: He is alert and oriented to person, place, and time.     Cranial Nerves: No cranial nerve deficit.     Sensory: No sensory deficit.     Motor: No  weakness.  Psychiatric:        Mood and Affect: Mood normal.        Behavior: Behavior normal.     ED Results / Procedures / Treatments   Labs (all labs ordered are listed, but only abnormal results are displayed) Labs Reviewed  BASIC METABOLIC PANEL - Abnormal; Notable for the following components:      Result Value   Glucose, Bld 358 (*)    BUN 25 (*)    All other components within normal limits  CBC WITH DIFFERENTIAL/PLATELET - Abnormal; Notable for the following components:   Abs Immature Granulocytes 0.08 (*)    All other components within normal limits  BLOOD GAS, VENOUS - Abnormal; Notable for the following components:   pCO2, Ven 33 (*)    pO2, Ven 81 (*)    Acid-base deficit 2.9 (*)    All other components within normal limits  URINALYSIS, COMPLETE (UACMP) WITH MICROSCOPIC - Abnormal; Notable for the following components:   Color, Urine STRAW (*)    Glucose, UA >=500 (*)    All other components within normal limits  CBG MONITORING, ED - Abnormal; Notable for the following components:   Glucose-Capillary 334 (*)    All other components within normal limits  CBG MONITORING, ED - Abnormal; Notable for the following components:   Glucose-Capillary 272 (*)    All other components within normal limits  RESP PANEL BY RT-PCR (FLU A&B, COVID) ARPGX2  BETA-HYDROXYBUTYRIC ACID  LACTIC ACID, PLASMA    EKG None  Radiology DG Chest 2 View  Result Date: 11/02/2021 CLINICAL DATA:  Cough EXAM: CHEST - 2 VIEW COMPARISON:  Chest x-ray 03/06/2020, CT chest 11/01/2018 FINDINGS: The heart and mediastinal contours are within normal limits. No focal consolidation. No pulmonary edema. No pleural effusion. No pneumothorax. No acute osseous abnormality. IMPRESSION: No active cardiopulmonary disease. Electronically Signed   By: Iven Finn M.D.   On: 11/02/2021 19:28    Procedures Procedures    Medications Ordered in ED Medications  lactated ringers bolus 2,032 mL (2,032 mLs  Intravenous New Bag/Given 11/02/21 1813)  acetaminophen (TYLENOL) tablet 650 mg (650 mg Oral Given 11/02/21 1843)    ED Course/ Medical Decision Making/ A&P Clinical Course as of 11/02/21 2023  Mon Nov 02, 2021  1943 Patient is hyperglycemic but without evidence of DKA. [VK]  2019 Glucose downtrending to 272.  No obvious source of infection. [VK]  Clinical Course User Index [VK] Kemper Durie, DO                           Medical Decision Making This patient presents to the ED with chief complaint(s) of hyperglycemia with pertinent past medical history of diabetes, hypertension, CAD which further complicates the presenting complaint. The complaint involves an extensive differential diagnosis and also carries with it a high risk of complications and morbidity.    The differential diagnosis includes DKA, HHS unlikely as patient has normal mental status, possible viral infection or pneumonia, dehydration, no neck stiffness, no fever or focal neurologic deficits making meningitis or encephalitis unlikely, could be related to recent steroid use  Additional history obtained: Additional history obtained from EMS  Records reviewed reviewed ED records  ED Course and Reassessment: Patient initially had repeat Accu-Chek performed here which was 370.  He was started on IV fluids and will have labs performed to evaluate the cause of his hyperglycemia.  He was given Tylenol for his headache.  Independent labs interpretation:  The following labs were independently interpreted: Hyperglycemia without evidence of DKA  Independent visualization of imaging: - I independently visualized the following imaging with scope of interpretation limited to determining acute life threatening conditions related to emergency care: Chest x-ray, which revealed no acute disease  Consultation: - Consulted or discussed management/test interpretation w/ external professional: N/A  Consideration for admission or  further workup: Patient has no emergent conditions requiring admission at this time and is stable for discharge home with primary care follow-up Social Determinants of health: N/A    Amount and/or Complexity of Data Reviewed Labs: ordered. Radiology: ordered.  Risk OTC drugs.          Final Clinical Impression(s) / ED Diagnoses Final diagnoses:  Hyperglycemia    Rx / DC Orders ED Discharge Orders     None         Kemper Durie, DO 11/02/21 2028

## 2021-11-02 NOTE — ED Triage Notes (Signed)
Patient brought in by EMS for hyperglycemia episode. He states he was newly DX with DMII 1 week ago and was started on insulin. He also voiced receiving a steroid injection for R hip pain a week ago. He is having more fatigue and diarrhea. CBG per EMS 517.   130/67 82 97%

## 2021-11-04 ENCOUNTER — Emergency Department (HOSPITAL_COMMUNITY)
Admission: EM | Admit: 2021-11-04 | Discharge: 2021-11-04 | Disposition: A | Discharge status: Home / Self Care | Payer: BC Managed Care – PPO | Attending: Emergency Medicine | Admitting: Emergency Medicine

## 2021-11-04 ENCOUNTER — Other Ambulatory Visit: Payer: Self-pay

## 2021-11-04 ENCOUNTER — Encounter (HOSPITAL_COMMUNITY): Payer: Self-pay | Admitting: *Deleted

## 2021-11-04 ENCOUNTER — Emergency Department (HOSPITAL_COMMUNITY): Payer: BC Managed Care – PPO

## 2021-11-04 DIAGNOSIS — R42 Dizziness and giddiness: Secondary | ICD-10-CM | POA: Insufficient documentation

## 2021-11-04 DIAGNOSIS — Z7984 Long term (current) use of oral hypoglycemic drugs: Secondary | ICD-10-CM | POA: Insufficient documentation

## 2021-11-04 DIAGNOSIS — Z79899 Other long term (current) drug therapy: Secondary | ICD-10-CM | POA: Insufficient documentation

## 2021-11-04 DIAGNOSIS — R739 Hyperglycemia, unspecified: Secondary | ICD-10-CM

## 2021-11-04 DIAGNOSIS — Z7982 Long term (current) use of aspirin: Secondary | ICD-10-CM | POA: Diagnosis not present

## 2021-11-04 DIAGNOSIS — I10 Essential (primary) hypertension: Secondary | ICD-10-CM | POA: Insufficient documentation

## 2021-11-04 DIAGNOSIS — R3589 Other polyuria: Secondary | ICD-10-CM | POA: Diagnosis present

## 2021-11-04 DIAGNOSIS — I2510 Atherosclerotic heart disease of native coronary artery without angina pectoris: Secondary | ICD-10-CM | POA: Diagnosis not present

## 2021-11-04 DIAGNOSIS — E1165 Type 2 diabetes mellitus with hyperglycemia: Secondary | ICD-10-CM | POA: Diagnosis not present

## 2021-11-04 DIAGNOSIS — M549 Dorsalgia, unspecified: Secondary | ICD-10-CM | POA: Diagnosis not present

## 2021-11-04 LAB — URINALYSIS, ROUTINE W REFLEX MICROSCOPIC
Bacteria, UA: NONE SEEN
Bilirubin Urine: NEGATIVE
Glucose, UA: >=500 mg/dL — AB
Hgb urine dipstick: NEGATIVE
Ketones, ur: NEGATIVE mg/dL
Nitrite: NEGATIVE
Protein, ur: NEGATIVE mg/dL
Specific Gravity, Urine: 1.025 (ref 1.005–1.030)
pH: 5 (ref 5.0–8.0)

## 2021-11-04 LAB — CBG MONITORING, ED: Glucose-Capillary: 253 mg/dL — ABNORMAL HIGH (ref 70–99)

## 2021-11-04 NOTE — Discharge Instructions (Signed)
Your history, exam, work-up today did not show evidence of acute bony abnormality your back noted to evidence of UTI.  I suspect your elevated glucose levels may still be related to the recent steroid use.  Given your stability for several hours and improving glucoses we agreed to send you home.  Please go to your appointment this week to discuss further management..  If any symptoms change or worsen, return to the nearest emergency room.

## 2021-11-04 NOTE — ED Provider Notes (Signed)
[facility] DEPT Provider Note   CSN: [id number] Arrival date & time: 11/04/21  1425     History  Chief Complaint  Patient presents with   Hyperglycemia         [name] is a 56 y.o. male.  Patient with hx of T2DM, HTN, CAD and depression presents for hyperglycemia. He measured his blood sugar this morning and was 369. Has increased urinary frequency. Endorses lightheadedness standing up this morning, no LOC. Reports steroid injection about 3 weeks ago for hip pain. He endorses taking his medications regularly. One episode of loose stools. No hematochezia or melena. Denies fever, chills, headache, abdominal pain, n/v. No recent illness or sick contacts. He was evaluated in ED on 10/2 for similar concern, given IV fluids with symptom improvement. He has scheduled PCP appointment this Friday.   The history is provided by the patient.  Hyperglycemia Associated symptoms: polyuria   Associated symptoms: no abdominal pain, no dysuria, no fever, no nausea, no shortness of breath, no vomiting and no weakness       Home Medications Prior to Admission medications   Medication Sig Start Date End Date Taking? Authorizing Provider  acetaminophen (TYLENOL) 325 MG tablet Take 2 tablets (650 mg total) by mouth every 4 (four) hours as needed for headache or mild pain. 03/07/20   Matilde Haymaker, MD  aspirin EC 325 MG EC tablet Take 1 tablet (325 mg total) by mouth daily. 06/28/15   Nita Sells, MD  atorvastatin (LIPITOR) 80 MG tablet Take 1 tablet (80 mg total) by mouth daily at 6 PM. Patient not taking: No sig reported 06/28/15   Nita Sells, MD  blood glucose meter kit and supplies KIT Dispense based on patient and insurance preference. Use up to four times daily as directed. 11/02/21   Leanord Asal K, DO  FARXIGA 10 MG TABS tablet Take 10 mg by mouth daily. 11/20/15   [provider]  glimepiride (AMARYL) 4 MG tablet Take 4 mg by mouth 2  (two) times daily.    [provider]  JANUVIA 100 MG tablet Take 100 mg by mouth daily. 07/15/16   [provider]  lidocaine (LIDODERM) 5 % Place 1 patch onto the skin daily. Remove & Discard patch within 12 hours or as directed by MD 03/07/20   Matilde Haymaker, MD  lisinopril (PRINIVIL,ZESTRIL) 40 MG tablet Take 40 mg by mouth daily.  01/17/15   [provider]  metFORMIN (GLUCOPHAGE) 850 MG tablet Take 1 tablet (850 mg total) by mouth 3 (three) times daily. 07/03/13   Piepenbrink, Anderson Malta, PA-C  metoprolol tartrate (LOPRESSOR) 25 MG tablet Take 1 tablet (25 mg total) by mouth 2 (two) times daily. 06/28/15   Nita Sells, MD  pantoprazole (PROTONIX) 40 MG tablet Take 40 mg by mouth daily. 02/18/20   [provider]      Allergies    Patient has no known allergies.    Review of Systems   Review of Systems  Constitutional:  Negative for chills and fever.  Respiratory:  Positive for cough. Negative for shortness of breath.   Gastrointestinal:  Positive for diarrhea. Negative for abdominal pain, nausea and vomiting.  Endocrine: Positive for polyuria.  Genitourinary:  Negative for dysuria.  Musculoskeletal:  Positive for back pain.  Neurological:  Positive for light-headedness. Negative for weakness, numbness and headaches.    Physical Exam Updated Vital Signs BP 100/65   Pulse 69   Temp 98.6 F (37 C) (Oral)  Resp 17   Wt 101.6 kg   SpO2 95%   BMI 28.76 kg/m  Physical Exam Constitutional:      General: He is not in acute distress.    Appearance: He is not ill-appearing.  HENT:     Head: Normocephalic and atraumatic.  Cardiovascular:     Rate and Rhythm: Normal rate and regular rhythm.  Pulmonary:     Effort: Pulmonary effort is normal.     Breath sounds: Normal breath sounds.  Abdominal:     General: Abdomen is flat. There is no distension.     Palpations: Abdomen is soft.     Tenderness: There is no abdominal tenderness.  Skin:     General: Skin is warm and dry.  Neurological:     Mental Status: He is alert.     ED Results / Procedures / Treatments   Labs (all labs ordered are listed, but only abnormal results are displayed) Labs Reviewed  URINALYSIS, ROUTINE W REFLEX MICROSCOPIC - Abnormal; Notable for the following components:      Result Value   Glucose, UA >=500 (*)    Leukocytes,Ua SMALL (*)    All other components within normal limits  CBG MONITORING, ED - Abnormal; Notable for the following components:   Glucose-Capillary 253 (*)    All other components within normal limits    EKG None  Radiology DG Lumbar Spine Complete  Result Date: 11/04/2021 CLINICAL DATA:  Lower back pain. EXAM: LUMBAR SPINE - COMPLETE 4+ VIEW COMPARISON:  September 26, 2016. FINDINGS: There is no evidence of lumbar spine fracture. Alignment is normal. Intervertebral disc spaces are maintained. IMPRESSION: Negative. Electronically Signed   By: Marijo Conception M.D.   On: 11/04/2021 15:26    Procedures Procedures   Medications Ordered in ED Medications - No data to display  ED Course/ Medical Decision Making/ A&P                           Medical Decision Making  Patient presents with hyperglycemia and back pain. No signs of recent infection. History of recent steroid injection. Back pain is mild and improving with no weakness or numbness of LE. He denies any red flag symptoms. Lumbar xray negative. UA negative for UTI. Vitals hemodynamically stable. Patient is stable for discharge with follow up with PCP on Friday. Return precautions given.   Final Clinical Impression(s) / ED Diagnoses Final diagnoses:  Hyperglycemia    Rx / DC Orders ED Discharge Orders     None         Angelique Blonder, DO 11/04/21 2349    Tegeler, Gwenyth Allegra, MD 11/07/21 [phone]

## 2021-11-04 NOTE — ED Triage Notes (Signed)
States CBG 367 and he has had diarrhea since lunch. CBG 253 in triage

## 2021-11-04 NOTE — ED Provider Triage Note (Signed)
Emergency Medicine Provider Triage Evaluation Note  [name] , a 56 y.o. male  was evaluated in triage.  Pt complains of hyperglycemia, back, headache. Seen for same 2 days ago. No fever.  + sweats and diarrhea  Review of Systems  Positive: Back pain Negative: fever  Physical Exam  BP 117/80 (BP Location: Right Arm)   Pulse 84   Temp 97.9 F (36.6 C) (Oral)   Resp 18   Wt 101.6 kg   SpO2 99%   BMI 28.76 kg/m  Gen:   Awake, no distress   Resp:  Normal effort  MSK:   Moves extremities without difficulty  Other:  No weakness of legs  Medical Decision Making  Medically screening exam initiated at 2:40 PM.  Appropriate orders placed.  [name] was informed that the remainder of the evaluation will be completed by another provider, this initial triage assessment does not replace that evaluation, and the importance of remaining in the ED until their evaluation is complete.  Work up initiated.   Margarita Mail, PA-C 11/04/21 1441

## 2021-12-28 ENCOUNTER — Emergency Department (HOSPITAL_COMMUNITY)
Admission: EM | Admit: 2021-12-28 | Discharge: 2021-12-28 | Disposition: A | Discharge status: Home / Self Care | Payer: BC Managed Care – PPO | Attending: Emergency Medicine | Admitting: Emergency Medicine

## 2021-12-28 ENCOUNTER — Emergency Department (HOSPITAL_COMMUNITY): Payer: BC Managed Care – PPO

## 2021-12-28 ENCOUNTER — Other Ambulatory Visit: Payer: Self-pay

## 2021-12-28 DIAGNOSIS — R000 Tachycardia, unspecified: Secondary | ICD-10-CM | POA: Insufficient documentation

## 2021-12-28 DIAGNOSIS — I2510 Atherosclerotic heart disease of native coronary artery without angina pectoris: Secondary | ICD-10-CM | POA: Diagnosis not present

## 2021-12-28 DIAGNOSIS — R739 Hyperglycemia, unspecified: Secondary | ICD-10-CM

## 2021-12-28 DIAGNOSIS — E1165 Type 2 diabetes mellitus with hyperglycemia: Secondary | ICD-10-CM | POA: Insufficient documentation

## 2021-12-28 DIAGNOSIS — Z7982 Long term (current) use of aspirin: Secondary | ICD-10-CM | POA: Insufficient documentation

## 2021-12-28 DIAGNOSIS — Z7984 Long term (current) use of oral hypoglycemic drugs: Secondary | ICD-10-CM | POA: Insufficient documentation

## 2021-12-28 DIAGNOSIS — M940 Chondrocostal junction syndrome [Tietze]: Secondary | ICD-10-CM | POA: Insufficient documentation

## 2021-12-28 DIAGNOSIS — I10 Essential (primary) hypertension: Secondary | ICD-10-CM | POA: Diagnosis not present

## 2021-12-28 DIAGNOSIS — Z79899 Other long term (current) drug therapy: Secondary | ICD-10-CM | POA: Insufficient documentation

## 2021-12-28 DIAGNOSIS — K649 Unspecified hemorrhoids: Secondary | ICD-10-CM | POA: Insufficient documentation

## 2021-12-28 HISTORY — DX: Chondrocostal junction syndrome (tietze): M94.0

## 2021-12-28 LAB — URINALYSIS, ROUTINE W REFLEX MICROSCOPIC
Bacteria, UA: NONE SEEN
Bilirubin Urine: NEGATIVE
Glucose, UA: >=500 mg/dL — AB
Hgb urine dipstick: NEGATIVE
Ketones, ur: NEGATIVE mg/dL
Leukocytes,Ua: NEGATIVE
Nitrite: NEGATIVE
Protein, ur: NEGATIVE mg/dL
Specific Gravity, Urine: 1.018 (ref 1.005–1.030)
pH: 6 (ref 5.0–8.0)

## 2021-12-28 LAB — BLOOD GAS, VENOUS
Acid-base deficit: 3.1 mmol/L — ABNORMAL HIGH (ref 0.0–2.0)
Bicarbonate: 22 mmol/L (ref 20.0–28.0)
O2 Saturation: 66.2 %
Patient temperature: 37
pCO2, Ven: 39 mmHg — ABNORMAL LOW (ref 44–60)
pH, Ven: 7.36 (ref 7.25–7.43)
pO2, Ven: 38 mmHg (ref 32–45)

## 2021-12-28 LAB — CBC WITH DIFFERENTIAL/PLATELET
Abs Immature Granulocytes: 0.14 10*3/uL — ABNORMAL HIGH (ref 0.00–0.07)
Basophils Absolute: 0 10*3/uL (ref 0.0–0.1)
Basophils Relative: 0 %
Eosinophils Absolute: 0 10*3/uL (ref 0.0–0.5)
Eosinophils Relative: 0 %
HCT: 36.5 % — ABNORMAL LOW (ref 39.0–52.0)
Hemoglobin: 12.7 g/dL — ABNORMAL LOW (ref 13.0–17.0)
Immature Granulocytes: 2 %
Lymphocytes Relative: 37 %
Lymphs Abs: 3.4 10*3/uL (ref 0.7–4.0)
MCH: 31.6 pg (ref 26.0–34.0)
MCHC: 34.8 g/dL (ref 30.0–36.0)
MCV: 90.8 fL (ref 80.0–100.0)
Monocytes Absolute: 0.7 10*3/uL (ref 0.1–1.0)
Monocytes Relative: 7 %
Neutro Abs: 5 10*3/uL (ref 1.7–7.7)
Neutrophils Relative %: 54 %
Platelets: 203 10*3/uL (ref 150–400)
RBC: 4.02 MIL/uL — ABNORMAL LOW (ref 4.22–5.81)
RDW: 14.1 % (ref 11.5–15.5)
WBC: 9.2 10*3/uL (ref 4.0–10.5)
nRBC: 0 % (ref 0.0–0.2)

## 2021-12-28 LAB — BASIC METABOLIC PANEL
Anion gap: 9 (ref 5–15)
BUN: 31 mg/dL — ABNORMAL HIGH (ref 6–20)
CO2: 21 mmol/L — ABNORMAL LOW (ref 22–32)
Calcium: 8.7 mg/dL — ABNORMAL LOW (ref 8.9–10.3)
Chloride: 104 mmol/L (ref 98–111)
Creatinine, Ser: 1.47 mg/dL — ABNORMAL HIGH (ref 0.61–1.24)
GFR, Estimated: 55 mL/min — ABNORMAL LOW (ref >60)
Glucose, Bld: 448 mg/dL — ABNORMAL HIGH (ref 70–99)
Potassium: 4.2 mmol/L (ref 3.5–5.1)
Sodium: 134 mmol/L — ABNORMAL LOW (ref 135–145)

## 2021-12-28 LAB — CBG MONITORING, ED
Glucose-Capillary: 374 mg/dL — ABNORMAL HIGH (ref 70–99)
Glucose-Capillary: 405 mg/dL — ABNORMAL HIGH (ref 70–99)
Glucose-Capillary: 255 mg/dL — ABNORMAL HIGH (ref 70–99)

## 2021-12-28 LAB — BETA-HYDROXYBUTYRIC ACID: Beta-Hydroxybutyric Acid: 0.22 mmol/L (ref 0.05–0.27)

## 2021-12-28 MED ORDER — LACTATED RINGERS IV BOLUS
20.0000 mL/kg | Freq: Once | INTRAVENOUS | Status: AC
  Administered 2021-12-28: 2032 mL via INTRAVENOUS

## 2021-12-28 NOTE — ED Provider Notes (Signed)
[facility] DEPT Provider Note   CSN: [id number] Arrival date & time: 12/28/21  1640     History  Chief Complaint  Patient presents with   Hyperglycemia    [name] is a 56 y.o. male with CAD, T2DM, HTN, high cholesterol, vertigo presents with hyperglycemia.   Patient is brought in by EMS from home with complaints of hyperglycemia.  Patient states his blood sugars have been 300-500 for approximately 3 to 4 days.  He states he feels mildly short of breath but has not had any nausea vomiting or abdominal pain.  Endorses mild cough x 3 days. No recent illnesses/fever/chills.  No sick contacts.  Patient denies also any palpitations, chest pain, lower extremity edema, dysuria/hematuria, diarrhea/constipation.  Patient states though however that he has had to exert more effort to urinate recently.  Denies any history of DKA/HHS.  EMS CBG was 525 mg/dL.  1 L NS bolus and 4 mg Zofran were given.  Per chart review, patient has been seen several times in the emergency department for hyperglycemia.   Hyperglycemia      Home Medications Prior to Admission medications   Medication Sig Start Date End Date Taking? Authorizing Provider  acetaminophen (TYLENOL) 325 MG tablet Take 2 tablets (650 mg total) by mouth every 4 (four) hours as needed for headache or mild pain. 03/07/20   Matilde Haymaker, MD  aspirin EC 325 MG EC tablet Take 1 tablet (325 mg total) by mouth daily. 06/28/15   Nita Sells, MD  atorvastatin (LIPITOR) 80 MG tablet Take 1 tablet (80 mg total) by mouth daily at 6 PM. Patient not taking: No sig reported 06/28/15   Nita Sells, MD  blood glucose meter kit and supplies KIT Dispense based on patient and insurance preference. Use up to four times daily as directed. 11/02/21   Leanord Asal K, DO  FARXIGA 10 MG TABS tablet Take 10 mg by mouth daily. 11/20/15   [provider]  glimepiride (AMARYL) 4 MG tablet Take 4 mg by mouth  2 (two) times daily.    [provider]  JANUVIA 100 MG tablet Take 100 mg by mouth daily. 07/15/16   [provider]  lidocaine (LIDODERM) 5 % Place 1 patch onto the skin daily. Remove & Discard patch within 12 hours or as directed by MD 03/07/20   Matilde Haymaker, MD  lisinopril (PRINIVIL,ZESTRIL) 40 MG tablet Take 40 mg by mouth daily.  01/17/15   [provider]  metFORMIN (GLUCOPHAGE) 850 MG tablet Take 1 tablet (850 mg total) by mouth 3 (three) times daily. 07/03/13   Piepenbrink, Anderson Malta, PA-C  metoprolol tartrate (LOPRESSOR) 25 MG tablet Take 1 tablet (25 mg total) by mouth 2 (two) times daily. 06/28/15   Nita Sells, MD  pantoprazole (PROTONIX) 40 MG tablet Take 40 mg by mouth daily. 02/18/20   [provider]      Allergies    Patient has no known allergies.    Review of Systems   Review of Systems Review of systems negative for f/c.  A 10 point review of systems was performed and is negative unless otherwise reported in HPI.  Physical Exam Updated Vital Signs BP 103/62   Pulse 78   Temp 97.6 F (36.4 C) (Oral)   Resp 18   Ht _0  (1.88 m)   Wt 101.6 kg   SpO2 99%   BMI 28.76 kg/m  Physical Exam General: Uncomfortable appearing male, sitting in bed.  HEENT: PERRLA,  Sclera anicteric, MMM, trachea midline. Cardiology: RRR, no murmurs/rubs/gallops. BL radial and DP pulses equal bilaterally.  Resp: Normal respiratory effort, mildly elevated rate. CTAB, no wheezes, rhonchi, crackles.  Abd: Soft, non-tender, non-distended. No rebound tenderness or guarding.  GU: Deferred. MSK: No peripheral edema or signs of trauma. Extremities without deformity or TTP. No cyanosis or clubbing. Skin: warm, dry. No rashes or lesions. Neuro: A&Ox4, CNs II-XII grossly intact. MAEs. Sensation grossly intact.  Psych: Normal mood and affect.   ED Results / Procedures / Treatments   Labs (all labs ordered are listed, but only abnormal results are  displayed) Labs Reviewed  BASIC METABOLIC PANEL - Abnormal; Notable for the following components:      Result Value   Sodium 134 (*)    CO2 21 (*)    Glucose, Bld 448 (*)    BUN 31 (*)    Creatinine, Ser 1.47 (*)    Calcium 8.7 (*)    GFR, Estimated 55 (*)    All other components within normal limits  CBC WITH DIFFERENTIAL/PLATELET - Abnormal; Notable for the following components:   RBC 4.02 (*)    Hemoglobin 12.7 (*)    HCT 36.5 (*)    Abs Immature Granulocytes 0.14 (*)    All other components within normal limits  BLOOD GAS, VENOUS - Abnormal; Notable for the following components:   pCO2, Ven 39 (*)    Acid-base deficit 3.1 (*)    All other components within normal limits  URINALYSIS, ROUTINE W REFLEX MICROSCOPIC - Abnormal; Notable for the following components:   Color, Urine STRAW (*)    Glucose, UA >=500 (*)    All other components within normal limits  CBG MONITORING, ED - Abnormal; Notable for the following components:   Glucose-Capillary 405 (*)    All other components within normal limits  CBG MONITORING, ED - Abnormal; Notable for the following components:   Glucose-Capillary 374 (*)    All other components within normal limits  CBG MONITORING, ED - Abnormal; Notable for the following components:   Glucose-Capillary 255 (*)    All other components within normal limits  BETA-HYDROXYBUTYRIC ACID  BETA-HYDROXYBUTYRIC ACID    EKG EKG Interpretation  Date/Time:  Monday December 28 2021 17:17:21 EST Ventricular Rate:  70 PR Interval:  154 QRS Duration: 96 QT Interval:  370 QTC Calculation: 400 R Axis:   6 Text Interpretation: Sinus rhythm Similar to prior Confirmed by Cindee Lame [phone]) on 12/28/2021 9:11:30 PM  Radiology DG Chest Portable 1 View  Result Date: 12/28/2021 CLINICAL DATA:  Shortness of breath EXAM: PORTABLE CHEST 1 VIEW COMPARISON:  11/02/2021 FINDINGS: The heart size and mediastinal contours are within normal limits. Both lungs are clear.  The visualized skeletal structures are unremarkable. IMPRESSION: No active disease. Electronically Signed   By: Davina Poke D.O.   On: 12/28/2021 18:25    Procedures Procedures    Medications Ordered in ED Medications  lactated ringers bolus 2,032 mL (0 mLs Intravenous Stopped 12/28/21 2021)    ED Course/ Medical Decision Making/ A&P                          Medical Decision Making Amount and/or Complexity of Data Reviewed Labs: ordered. Decision-making details documented in ED Course. Radiology: ordered. Decision-making details documented in ED Course.    This patient presents with mild tachypnea and apparent acute hyperglycemia. Differential diagnosis includes DKA, HHS, medication noncompliance, infection, dehydration. Patient is afebrile,  not tachycardic, very low c/f sepsis or acute life threatening infection. Patient does endorse coughing will ensure he does not have a pneumonia, pulm edema. No wheezing to suggest COPD/asthma and no history. No CP to suggest PE or ACS, no signs/sxs of DVT on exam, and EKG demonstrates NSR without any arrhythmia, similar to prior EKGs. Consider UTI given patient's report of increased effort to urinate, but also consider BPH. Patient has had several visits for hyperglycemia and does not take any insulin, possible that patient needs medication adjustments or to be started on insulin by his PCP/endocrinologist.   Labs demonstrate pH 7.36, pCO2 39, hemoglobin 12.7, WBC 9.2.  Initial blood glucose 405, subsequent 374. UA with no ketones or UTI.   I have personally reviewed and interpreted all labs and imaging.  Patient was maintained on a cardiac monitor.  I have personally interpreted the telemetry as NSR.  Clinical Course as of 12/28/21 2025  Mon Dec 28, 2021  1719 Glucose-Capillary(!): 405 [HN]  1807 Sodium(!): 134 Corrects to 140-142 [HN]  1811 Potassium: 4.2 [HN]  1812 Creatinine(!): 1.47 Up from BL ~1 [HN]  1812 BUN(!): 31 [HN]  1812  Glucose, UA(!): >=500 [HN]  1812 Ketones, ur: NEGATIVE [HN]  1813 Patient without acidosis or ketonuria, likely not in DKA [HN]  1846 DG Chest Portable 1 View FINDINGS: The heart size and mediastinal contours are within normal limits. Both lungs are clear. The visualized skeletal structures are unremarkable.  IMPRESSION: No active disease.   [HN]  1920 Beta-Hydroxybutyric Acid: 0.22 Neg [HN]  2024 Glucose-Capillary(!): 255 Glucose decreased with fluids. Patient reevaluated and states he feels much improved. I discussed with patient that he needs to continue taking his medications as prescribed but should also f/u with PCP and cal them in the morning to schedule appt this week as he may need to have his medications adjusted or start insulin. Patient and his wife report understanding.  [HN]    Clinical Course User Index [HN] Audley Hose, MD    Dispo: DC w/ discharge instructions/return precautions         Final Clinical Impression(s) / ED Diagnoses Final diagnoses:  Hyperglycemia    Rx / DC Orders ED Discharge Orders     None        This note was created using dictation software, which may contain spelling or grammatical errors.    Audley Hose, MD 12/28/21 2112

## 2021-12-28 NOTE — Discharge Instructions (Addendum)
Thank you for coming to [facility] Emergency Department. You were seen for hyperglycemia or high blood sugar. We did an exam, labs, and imaging, and these showed high blood sugar that came down with fluids.  Please follow up with your primary care provider within 1 week. It is possible that you may need to adjust your diabetes medications and/or be started on insulin given your visits to the ED for hyperglycemia.   Do not hesitate to return to the ED or call 911 if you experience: -Worsening symptoms -Lightheadedness, passing out -Fevers/chills -Anything else that concerns you

## 2021-12-28 NOTE — ED Triage Notes (Signed)
Patient BIB EMS from home c/o hyperglycemia. Per EMS pt CGB at home was 525.  1L NS Bolus and '4mg'$  IV Zofran given by EMS. Pt a/ox4. Hx DM.

## 2022-01-12 ENCOUNTER — Encounter: Payer: Self-pay | Admitting: *Deleted

## 2022-01-12 ENCOUNTER — Encounter: Payer: Self-pay | Admitting: Cardiology

## 2022-02-03 ENCOUNTER — Ambulatory Visit: Payer: BC Managed Care – PPO | Admitting: Cardiology

## 2022-02-15 DIAGNOSIS — M159 Polyosteoarthritis, unspecified: Secondary | ICD-10-CM | POA: Insufficient documentation

## 2022-02-15 DIAGNOSIS — E875 Hyperkalemia: Secondary | ICD-10-CM | POA: Insufficient documentation

## 2022-02-15 DIAGNOSIS — N529 Male erectile dysfunction, unspecified: Secondary | ICD-10-CM | POA: Insufficient documentation

## 2022-02-15 DIAGNOSIS — F32A Depression, unspecified: Secondary | ICD-10-CM | POA: Insufficient documentation

## 2022-02-15 DIAGNOSIS — E291 Testicular hypofunction: Secondary | ICD-10-CM | POA: Insufficient documentation

## 2022-02-15 DIAGNOSIS — E1140 Type 2 diabetes mellitus with diabetic neuropathy, unspecified: Secondary | ICD-10-CM | POA: Insufficient documentation

## 2022-02-15 DIAGNOSIS — N1831 Chronic kidney disease, stage 3a: Secondary | ICD-10-CM | POA: Insufficient documentation

## 2022-02-15 DIAGNOSIS — R5382 Chronic fatigue, unspecified: Secondary | ICD-10-CM | POA: Insufficient documentation

## 2022-02-15 NOTE — Progress Notes (Deleted)
Cardiology Office Note:    Date:  02/16/2022   ID:  [name], DOB Jul 10, 1965, MRN [id number]  PCP:  Cher Nakai, MD  Cardiologist:  Shirlee More, MD   Referring MD: Cher Nakai, MD  ASSESSMENT:    1. Coronary artery disease of native artery of native heart with stable angina pectoris ([place])   2. Primary hypertension   3. High cholesterol    PLAN:    In order of problems listed above:  ***  Next appointment   Medication Adjustments/Labs and Tests Ordered: Current medicines are reviewed at length with the patient today.  Concerns regarding medicines are outlined above.  No orders of the defined types were placed in this encounter.  No orders of the defined types were placed in this encounter.    No chief complaint on file. ***  History of Present Illness:    [name] is a 57 y.o. male who is being seen today for the evaluation of *** at the request of Cher Nakai, MD. Left heart catheterization May 2017:  He was seen by Dr. Stanford Breed 1 03/07/2020 with a history of nonobstructive CAD hypertension hyperlipidemia and diabetes.  He underwent inpatient coronary angiography without evidence of acute coronary syndrome and was found to have nonobstructive CAD and had no intervention performed.  Left Heart Cath and Coronary Angiography   Conclusion  Ost RCA lesion, 25% stenosed. Prox LAD lesion, 50% stenosed. Ost Ramus lesion, 30% stenosed. Mid LAD lesion, 50% stenosed.   1. Moderate LAD stenosis with negative FFR evaluation 2. Mild nonobstructive LCx and RCA stenosis   Recommend: medical therapy for risk reduction and treatment of nonobstructive CAD    Past Medical History:  Diagnosis Date   Benign essential hypertension    CAD (coronary artery disease)    Chronic fatigue    Costochondritis 12/28/2021   Depression    Diabetes mellitus    Diabetic neuropathy (HCC)    Generalized osteoarthrosis, involving multiple sites    High cholesterol     Hypercholesterolemia 06/25/2015   Hyperkalemia    Hypogonadism in male    Organic erectile dysfunction    Stage 3a chronic kidney disease (CKD) ([place])    Type 2 diabetes mellitus without complication, without long-term current use of insulin ([place])     Past Surgical History:  Procedure Laterality Date   CARDIAC CATHETERIZATION  2010   Dr Terrence Dupont, D1 20-25%, CFX 20-25%, RCA 10%, EF 50-55%   CARDIAC CATHETERIZATION N/A 06/27/2015   Procedure: Left Heart Cath and Coronary Angiography;  Surgeon: Sherren Mocha, MD;  Location: [facility];  Service: Cardiovascular;  Laterality: N/A;   CARDIAC CATHETERIZATION N/A 06/27/2015   Procedure: Intravascular Pressure Wire/FFR Study;  Surgeon: Sherren Mocha, MD;  Location: [facility];  Service: Cardiovascular;  Laterality: N/A;   CORONARY ANGIOPLASTY WITH STENT PLACEMENT  2008   per pt, at [facility], not seen on 2010 cath   EYE SURGERY     KNEE SURGERY Bilateral     Current Medications: No outpatient medications have been marked as taking for the 02/16/22 encounter (Appointment) with Richardo Priest, MD.     Allergies:   Patient has no known allergies.   Social History   Socioeconomic History   Marital status: Divorced    Spouse name: Not on file   Number of children: 2   Years of education: 11   Highest education level: Not on file  Occupational History   Occupation: Pharmacist, community  Tobacco Use  Smoking status: Never   Smokeless tobacco: Never  Vaping Use   Vaping Use: Never used  Substance and Sexual Activity   Alcohol use: No   Drug use: No   Sexual activity: Yes    Birth control/protection: None  Other Topics Concern   Not on file  Social History Narrative   Lives in [place] with girlfriend.   Social Determinants of Health   Financial Resource Strain: Not on file  Food Insecurity: Not on file  Transportation Needs: Not on file  Physical Activity: Not on file  Stress: Not on file  Social Connections:  Not on file     Family History: The patient's ***family history includes Heart attack in his father; Heart failure in his father; Hyperlipidemia in his father; Hypertension in his father and mother; Stroke in his father.  ROS:   ROS Please see the history of present illness.    *** All other systems reviewed and are negative.  EKGs/Labs/Other Studies Reviewed:    The following studies were reviewed today: ***  EKG:  EKG is *** ordered today.  The ekg ordered today is personally reviewed and demonstrates ***  Recent Labs: 09/03/2021: ALT 14 12/28/2021: BUN 31; Creatinine, Ser 1.47; Hemoglobin 12.7; Platelets 203; Potassium 4.2; Sodium 134  Recent Lipid Panel    Component Value Date/Time   CHOL 113 03/07/2020 0338   TRIG 145 03/07/2020 0338   HDL 37 (L) 03/07/2020 0338   CHOLHDL 3.1 03/07/2020 0338   VLDL 29 03/07/2020 0338   LDLCALC 47 03/07/2020 0338    Physical Exam:    VS:  There were no vitals taken for this visit.    Wt Readings from Last 3 Encounters:  12/28/21 223 lb 15.8 oz (101.6 kg)  11/04/21 224 lb (101.6 kg)  11/02/21 224 lb (101.6 kg)     GEN: *** Well nourished, well developed in no acute distress HEENT: Normal NECK: No JVD; No carotid bruits LYMPHATICS: No lymphadenopathy CARDIAC: ***RRR, no murmurs, rubs, gallops RESPIRATORY:  Clear to auscultation without rales, wheezing or rhonchi  ABDOMEN: Soft, non-tender, non-distended MUSCULOSKELETAL:  No edema; No deformity  SKIN: Warm and dry NEUROLOGIC:  Alert and oriented x 3 PSYCHIATRIC:  Normal affect     Signed, Shirlee More, MD  02/16/2022 7:37 AM    [place]

## 2022-02-16 ENCOUNTER — Ambulatory Visit: Payer: BC Managed Care – PPO | Attending: Cardiology | Admitting: Cardiology

## 2022-02-18 ENCOUNTER — Encounter: Payer: Self-pay | Admitting: Cardiology

## 2022-02-26 ENCOUNTER — Emergency Department (HOSPITAL_COMMUNITY): Payer: BC Managed Care – PPO

## 2022-02-26 ENCOUNTER — Other Ambulatory Visit: Payer: Self-pay

## 2022-02-26 ENCOUNTER — Emergency Department (HOSPITAL_COMMUNITY)
Admission: EM | Admit: 2022-02-26 | Discharge: 2022-02-26 | Disposition: A | Discharge status: Home / Self Care | Payer: BC Managed Care – PPO | Attending: Emergency Medicine | Admitting: Emergency Medicine

## 2022-02-26 DIAGNOSIS — M7918 Myalgia, other site: Secondary | ICD-10-CM

## 2022-02-26 DIAGNOSIS — Z1152 Encounter for screening for COVID-19: Secondary | ICD-10-CM | POA: Insufficient documentation

## 2022-02-26 DIAGNOSIS — Z79899 Other long term (current) drug therapy: Secondary | ICD-10-CM | POA: Insufficient documentation

## 2022-02-26 DIAGNOSIS — I2510 Atherosclerotic heart disease of native coronary artery without angina pectoris: Secondary | ICD-10-CM | POA: Insufficient documentation

## 2022-02-26 DIAGNOSIS — Z7984 Long term (current) use of oral hypoglycemic drugs: Secondary | ICD-10-CM | POA: Diagnosis not present

## 2022-02-26 DIAGNOSIS — E1122 Type 2 diabetes mellitus with diabetic chronic kidney disease: Secondary | ICD-10-CM | POA: Insufficient documentation

## 2022-02-26 DIAGNOSIS — I129 Hypertensive chronic kidney disease with stage 1 through stage 4 chronic kidney disease, or unspecified chronic kidney disease: Secondary | ICD-10-CM | POA: Diagnosis not present

## 2022-02-26 DIAGNOSIS — Z7982 Long term (current) use of aspirin: Secondary | ICD-10-CM | POA: Diagnosis not present

## 2022-02-26 DIAGNOSIS — R109 Unspecified abdominal pain: Secondary | ICD-10-CM | POA: Insufficient documentation

## 2022-02-26 DIAGNOSIS — N183 Chronic kidney disease, stage 3 unspecified: Secondary | ICD-10-CM | POA: Insufficient documentation

## 2022-02-26 LAB — URINALYSIS, ROUTINE W REFLEX MICROSCOPIC
Bilirubin Urine: NEGATIVE
Glucose, UA: >=500 mg/dL — AB
Hgb urine dipstick: NEGATIVE
Ketones, ur: NEGATIVE mg/dL
Leukocytes,Ua: NEGATIVE
Nitrite: NEGATIVE
Protein, ur: NEGATIVE mg/dL
Specific Gravity, Urine: 1.017 (ref 1.005–1.030)
pH: 6 (ref 5.0–8.0)

## 2022-02-26 LAB — COMPREHENSIVE METABOLIC PANEL
ALT: 15 U/L (ref 0–44)
AST: 21 U/L (ref 15–41)
Albumin: 3.6 g/dL (ref 3.5–5.0)
Alkaline Phosphatase: 53 U/L (ref 38–126)
Anion gap: 7 (ref 5–15)
BUN: 15 mg/dL (ref 6–20)
CO2: 22 mmol/L (ref 22–32)
Calcium: 9 mg/dL (ref 8.9–10.3)
Chloride: 108 mmol/L (ref 98–111)
Creatinine, Ser: 0.89 mg/dL (ref 0.61–1.24)
GFR, Estimated: >60 mL/min (ref >60)
Glucose, Bld: 134 mg/dL — ABNORMAL HIGH (ref 70–99)
Potassium: 4.4 mmol/L (ref 3.5–5.1)
Sodium: 137 mmol/L (ref 135–145)
Total Bilirubin: 0.6 mg/dL (ref 0.3–1.2)
Total Protein: 7 g/dL (ref 6.5–8.1)

## 2022-02-26 LAB — CBC
HCT: 40.6 % (ref 39.0–52.0)
Hemoglobin: 13.2 g/dL (ref 13.0–17.0)
MCH: 32 pg (ref 26.0–34.0)
MCHC: 32.5 g/dL (ref 30.0–36.0)
MCV: 98.5 fL (ref 80.0–100.0)
Platelets: 250 10*3/uL (ref 150–400)
RBC: 4.12 MIL/uL — ABNORMAL LOW (ref 4.22–5.81)
RDW: 13.6 % (ref 11.5–15.5)
WBC: 6.1 10*3/uL (ref 4.0–10.5)
nRBC: 0 % (ref 0.0–0.2)

## 2022-02-26 LAB — RESP PANEL BY RT-PCR (RSV, FLU A&B, COVID)  RVPGX2
Influenza A by PCR: NEGATIVE
Influenza B by PCR: NEGATIVE
Resp Syncytial Virus by PCR: NEGATIVE
SARS Coronavirus 2 by RT PCR: NEGATIVE

## 2022-02-26 LAB — LIPASE, BLOOD: Lipase: 40 U/L (ref 11–51)

## 2022-02-26 LAB — D-DIMER, QUANTITATIVE: D-Dimer, Quant: 0.58 ug/mL-FEU — ABNORMAL HIGH (ref 0.00–0.50)

## 2022-02-26 MED ORDER — ACETAMINOPHEN 325 MG PO TABS
650.0000 mg | ORAL_TABLET | Freq: Once | ORAL | Status: AC
  Administered 2022-02-26: 650 mg via ORAL
  Filled 2022-02-26: qty 2

## 2022-02-26 MED ORDER — LIDOCAINE 5 % EX PTCH
1.0000 | MEDICATED_PATCH | CUTANEOUS | Status: DC
  Administered 2022-02-26: 1 via TRANSDERMAL
  Filled 2022-02-26: qty 1

## 2022-02-26 MED ORDER — KETOROLAC TROMETHAMINE 15 MG/ML IJ SOLN
15.0000 mg | Freq: Once | INTRAMUSCULAR | Status: AC
  Administered 2022-02-26: 15 mg via INTRAMUSCULAR
  Filled 2022-02-26: qty 1

## 2022-02-26 MED ORDER — OXYCODONE-ACETAMINOPHEN 5-325 MG PO TABS
1.0000 | ORAL_TABLET | Freq: Once | ORAL | Status: AC
  Administered 2022-02-26: 1 via ORAL
  Filled 2022-02-26: qty 1

## 2022-02-26 MED ORDER — ONDANSETRON 4 MG PO TBDP
4.0000 mg | ORAL_TABLET | Freq: Once | ORAL | Status: AC
  Administered 2022-02-26: 4 mg via ORAL
  Filled 2022-02-26: qty 1

## 2022-02-26 NOTE — ED Provider Triage Note (Signed)
Emergency Medicine Provider Triage Evaluation Note  [name] , a 57 y.o. male  was evaluated in triage.  Pt complains of R sided flank pain for the last 1 week, constant. Feels as if someone is stabbing his right flank. Patient states urine has been darker than normal however denies dysuria, feeling unable to urinate. Patient is endorsing nausea without vomiting. No diarrhea, no fevers. No chest pain or SOB. No history of kidney stones.   Review of Systems  Positive:  Negative:   Physical Exam  BP (!) 156/98 (BP Location: Left Arm)   Pulse 83   Temp 98.1 F (36.7 C) (Oral)   Resp 16   SpO2 98%  Gen:   Awake, no distress   Resp:  Normal effort  MSK:   Moves extremities without difficulty  Other:  R flank pain. Patient unable to sit down in triage due to being uncomfortable  Medical Decision Making  Medically screening exam initiated at 1:00 PM.  Appropriate orders placed.  [name] was informed that the remainder of the evaluation will be completed by another provider, this initial triage assessment does not replace that evaluation, and the importance of remaining in the ED until their evaluation is complete.     Azucena Carmello, PA-C 02/26/22 1301

## 2022-02-26 NOTE — ED Provider Notes (Signed)
[place] EMERGENCY DEPARTMENT AT [facility] Provider Note   CSN: [id number] Arrival date & time: 02/26/22  1155     History  No chief complaint on file.   Russ Looper is a 57 y.o. male with T2DM, CAD, HTN, HLD, stage III CKD, hemorrhoids, OA, hyperkalemia, depression, fatigue, hypogonadism who presents with R flank pain.   Pt complains of R sided flank pain for the last 1 week, constant. Wasn't doing anything in particular when it started, no h/o similar. Feels as if someone is stabbing his right flank. Patient states urine has been darker than normal however denies dysuria, feeling unable to urinate. Endorses associated cough productive of yellow phlegm x 1 month and shortness of breath, orthopnea. Denies f/c, hemoptysis, chest pain, diarrhea, lower extremity edema, recent hospitalizations/surgeries. Patient is endorsing nausea without vomiting. No history of kidney stones. Took tylenol at home but it didn't help.  HPI     Home Medications Prior to Admission medications   Medication Sig Start Date End Date Taking? Authorizing Provider  aspirin EC 81 MG tablet Take 81 mg by mouth daily. Swallow whole.    [provider]  citalopram (CELEXA) 20 MG tablet Take 20 mg by mouth daily. 08/28/21   [provider]  FARXIGA 10 MG TABS tablet Take 10 mg by mouth daily. 11/20/15   [provider]  glimepiride (AMARYL) 4 MG tablet Take 4 mg by mouth 2 (two) times daily.    [provider]  hydrochlorothiazide (HYDRODIURIL) 25 MG tablet Take 25 mg by mouth daily. 08/28/21   [provider]  lisinopril (PRINIVIL,ZESTRIL) 40 MG tablet Take 40 mg by mouth daily.  01/17/15   [provider]  lovastatin (MEVACOR) 40 MG tablet Take 80 mg by mouth daily. 08/28/21   [provider]  metFORMIN (GLUCOPHAGE) 850 MG tablet Take 1 tablet (850 mg total) by mouth 3 (three) times daily. 07/03/13   Piepenbrink, Anderson Malta, PA-C  metoprolol  tartrate (LOPRESSOR) 50 MG tablet Take 50 mg by mouth daily.    [provider]  oxyCODONE-acetaminophen (PERCOCET) 10-325 MG tablet Take 1 tablet by mouth 3 (three) times daily as needed for pain. 12/15/21   [provider]  OZEMPIC, 2 MG/DOSE, 8 MG/3ML SOPN Inject 2 mg into the skin once a week. 12/15/21   [provider]  pantoprazole (PROTONIX) 40 MG tablet Take 40 mg by mouth daily. 02/18/20   [provider]  pioglitazone (ACTOS) 30 MG tablet Take 30 mg by mouth daily. 12/22/21   [provider]  traMADol (ULTRAM) 50 MG tablet Take 50 mg by mouth every 8 (eight) hours as needed for moderate pain. 12/15/21   [provider]      Allergies    Patient has no known allergies.    Review of Systems   Review of Systems Review of systems Negative for f/c.  A 10 point review of systems was performed and is negative unless otherwise reported in HPI.  Physical Exam Updated Vital Signs BP (!) 144/75   Pulse (!) 59   Temp 98.7 F (37.1 C)   Resp 17   SpO2 99%  Physical Exam General: Normal appearing male, lying in bed.  HEENT: PERRLA, Sclera anicteric, MMM, trachea midline.  Cardiology: RRR, no murmurs/rubs/gallops. BL radial and DP pulses equal bilaterally.  Resp: Normal respiratory rate and effort. CTAB, no wheezes, rhonchi, crackles.  Abd: TTP in RLQ, RUQ. Soft, non-distended. No rebound tenderness or guarding.  GU: Deferred. MSK:  No peripheral edema or signs of trauma. Extremities without deformity or TTP. No cyanosis or clubbing. Skin: warm, dry. No rashes or lesions. Back: +R CVA tenderness, no L CVA tenderness Neuro: A&Ox4, CNs II-XII grossly intact. MAEs. Sensation grossly intact.  Psych: Normal mood and affect.   ED Results / Procedures / Treatments   Labs (all labs ordered are listed, but only abnormal results are displayed) Labs Reviewed  COMPREHENSIVE METABOLIC PANEL - Abnormal; Notable for the following components:       Result Value   Glucose, Bld 134 (*)    All other components within normal limits  CBC - Abnormal; Notable for the following components:   RBC 4.12 (*)    All other components within normal limits  URINALYSIS, ROUTINE W REFLEX MICROSCOPIC - Abnormal; Notable for the following components:   Glucose, UA >=500 (*)    Bacteria, UA RARE (*)    All other components within normal limits  D-DIMER, QUANTITATIVE - Abnormal; Notable for the following components:   D-Dimer, Quant 0.58 (*)    All other components within normal limits  RESP PANEL BY RT-PCR (RSV, FLU A&B, COVID)  RVPGX2  LIPASE, BLOOD    EKG EKG Interpretation  Date/Time:  Friday February 26 2022 21:21:56 EST Ventricular Rate:  65 PR Interval:  174 QRS Duration: 93 QT Interval:  431 QTC Calculation: 449 R Axis:   11 Text Interpretation: Sinus rhythm Confirmed by Cindee Lame [phone]) on 02/26/2022 9:47:42 PM  Radiology DG Chest 2 View  Result Date: 02/26/2022 CLINICAL DATA:  Cough, shortness of breath EXAM: CHEST - 2 VIEW COMPARISON:  None Available. FINDINGS: Cardiac and mediastinal contours are within normal limits. No focal pulmonary opacity. No pleural effusion or pneumothorax. No acute osseous abnormality. IMPRESSION: No acute cardiopulmonary process. Electronically Signed   By: Merilyn Baba M.D.   On: 02/26/2022 19:22   CT Renal Stone Study  Result Date: 02/26/2022 CLINICAL DATA:  Right flank pain EXAM: CT ABDOMEN AND PELVIS WITHOUT CONTRAST TECHNIQUE: Multidetector CT imaging of the abdomen and pelvis was performed following the standard protocol without IV contrast. RADIATION DOSE REDUCTION: This exam was performed according to the departmental dose-optimization program which includes automated exposure control, adjustment of the mA and/or kV according to patient size and/or use of iterative reconstruction technique. COMPARISON:  03/06/2020 FINDINGS: Lower chest: No acute abnormality. Hepatobiliary: No focal liver  abnormality is seen. No gallstones, gallbladder wall thickening, or biliary dilatation. Pancreas: Unremarkable. No pancreatic ductal dilatation or surrounding inflammatory changes. Spleen: Normal in size without focal abnormality. Adrenals/Urinary Tract: No nephrolithiasis or hydronephrosis. Right kidney midpole hypodensity consistent with a cyst measures 1.7 cm. This does not need further imaging follow up. There is urinary bladder wall thickening suggestive of cystitis. Correlate with urinalysis. This could also be due to hypertrophy. Stomach/Bowel: Stomach is within normal limits. Appendix appears normal. No evidence of bowel wall thickening, distention, or inflammatory changes. Vascular/Lymphatic: No significant vascular findings are present. No enlarged abdominal or pelvic lymph nodes. Reproductive: Prostate is unremarkable. Other: No abdominal wall hernia or abnormality. No abdominopelvic ascites. Musculoskeletal: No acute or significant osseous findings. IMPRESSION: 1. Urinary bladder wall thickening consistent with cystitis or hypertrophy. 2. Small right kidney cyst. Electronically Signed   By: Sammie Bench M.D.   On: 02/26/2022 15:24    Procedures Procedures    Medications Ordered in ED Medications  lidocaine (LIDODERM) 5 % 1 patch (1 patch Transdermal Patch Applied 02/26/22 2118)  oxyCODONE-acetaminophen (PERCOCET/ROXICET) 5-325 MG per tablet 1  tablet (1 tablet Oral Given 02/26/22 1304)  ondansetron (ZOFRAN-ODT) disintegrating tablet 4 mg (4 mg Oral Given 02/26/22 1304)  ketorolac (TORADOL) 15 MG/ML injection 15 mg (15 mg Intramuscular Given 02/26/22 1831)  acetaminophen (TYLENOL) tablet 650 mg (650 mg Oral Given 02/26/22 1814)    ED Course/ Medical Decision Making/ A&P                          Medical Decision Making Amount and/or Complexity of Data Reviewed Labs: ordered. Decision-making details documented in ED Course. Radiology: ordered. Decision-making details documented in ED  Course.  Risk OTC drugs. Prescription drug management.    This patient presents to the ED for concern of abdominal pain, flank pain, this involves an extensive number of treatment options, and is a complaint that carries with it a high risk of complications and morbidity.  I considered the following differential and admission for this acute, potentially life threatening condition.   MDM:    For DDX for abdominal pain includes but is not limited to:  Abdominal exam without peritoneal signs. No evidence of acute abdomen at this time. Consider nephrolithiasis, pyelonephritis/UTI. Also consider pneumonia, URI, bronchitis w/ report of coughing/SOB. No LEE on exam, patient does not appear volume overloaded, but consider CHF/pulm edema, pleural effusion. Does have RLQ/RUQ pain on palpation, consider appendicitis, cholecystitis. Could represent an aortic pathology such as AAA. Lower c/f bowel obstruction, pancreatitis.    Clinical Course as of 02/26/22 2147  Fri Feb 26, 2022  1723 Comprehensive metabolic panel(!) unremarkable [HN]  1723 Lipase: 40 [HN]  1723 CBC(!) No leukocytosis or anemia [HN]  1723 Urinalysis, Routine w reflex microscopic -Urine, Clean Catch(!) Glucosuria but no hematuria or UTI [HN]  1723 CT Renal Stone Study 1. Urinary bladder wall thickening consistent with cystitis or hypertrophy. 2. Small right kidney cyst.   [HN]  1956 DG Chest 2 View FINDINGS: Cardiac and mediastinal contours are within normal limits. No focal pulmonary opacity. No pleural effusion or pneumothorax. No acute osseous abnormality.  IMPRESSION: No acute cardiopulmonary process.   [HN]  2102 D-Dimer, Quant(!): 0.58 PE ruled out by years criteria [HN]    Clinical Course User Index [HN] Audley Hose, MD    Labs: I Ordered, and personally interpreted labs.  The pertinent results include:  those listed above  Imaging Studies ordered: I ordered imaging studies including CXR. CT renal  stone study was ordered from triage. I independently visualized and interpreted imaging. I agree with the radiologist interpretation  Additional history obtained from chart review.  Reevaluation: After the interventions noted above, I reevaluated the patient and found that they have :improved  Social Determinants of Health: Patient lives independently   Disposition:  Called radiology to discuss and no need for scan with contrast on abdomen, very benign CT. Reassuring CXR and dimer.  Patient's broad w/u very reassuring against emergent cause of symptoms. Consider possible muscle spasm or musculoskeletal cause given report of increased pain with twisting.  Patient did improve somewhat with Tylenol and Toradol.  Instructed to take Tylenol ibuprofen at home.  Patient is given discharge instructions and return precautions.  All questions answered to patient satisfaction.  Co morbidities that complicate the patient evaluation  Past Medical History:  Diagnosis Date   Benign essential hypertension    CAD (coronary artery disease)    Chronic fatigue    Costochondritis 12/28/2021   Depression    Diabetes mellitus    Diabetic neuropathy ([place])  Generalized osteoarthrosis, involving multiple sites    High cholesterol    Hypercholesterolemia 06/25/2015   Hyperkalemia    Hypogonadism in male    Organic erectile dysfunction    Stage 3a chronic kidney disease (CKD) (HCC)    Type 2 diabetes mellitus without complication, without long-term current use of insulin (HCC)      Medicines Meds ordered this encounter  Medications   oxyCODONE-acetaminophen (PERCOCET/ROXICET) 5-325 MG per tablet 1 tablet   ondansetron (ZOFRAN-ODT) disintegrating tablet 4 mg   ketorolac (TORADOL) 15 MG/ML injection 15 mg   acetaminophen (TYLENOL) tablet 650 mg   lidocaine (LIDODERM) 5 % 1 patch    I have reviewed the patients home medicines and have made adjustments as needed  Problem List / ED Course: Problem  List Items Addressed This Visit   None Visit Diagnoses     Musculoskeletal pain    -  Primary                   This note was created using dictation software, which may contain spelling or grammatical errors.    Audley Hose, MD 02/26/22 2150

## 2022-02-26 NOTE — Discharge Instructions (Addendum)
Thank you for coming to [facility] Emergency Department. You were seen for flank pain. We did an exam, labs, and imaging, and these showed no acute findings.   You can alternate taking Tylenol and ibuprofen as needed for pain. You can take '650mg'$  tylenol (acetaminophen) every 4-6 hours, and 600 mg ibuprofen 3 times a day. You can also use over the counter lidocaine patches which may help the pain.  Please follow up with your primary care provider within 1 week.   Do not hesitate to return to the ED or call 911 if you experience: -Worsening symptoms -Numbness/tingling -Radiating pain -urinary or bowel changes -Lightheadedness, passing out -Fevers/chills -Anything else that concerns you

## 2022-02-26 NOTE — ED Triage Notes (Signed)
Reports right flank pain that radiates to his abdomen, ongoing x 1 week but worsening. Hurts to turn. Denies injury or trauma. Denies fevers, denies N/V, has a dry cough for the past two weeks. Took tylenol w/o relief.

## 2022-07-15 ENCOUNTER — Emergency Department (HOSPITAL_COMMUNITY)
Admission: EM | Admit: 2022-07-15 | Discharge: 2022-07-15 | Disposition: A | Discharge status: Home / Self Care | Payer: 59 | Attending: Emergency Medicine | Admitting: Emergency Medicine

## 2022-07-15 ENCOUNTER — Emergency Department (HOSPITAL_COMMUNITY): Payer: 59

## 2022-07-15 ENCOUNTER — Other Ambulatory Visit: Payer: Self-pay

## 2022-07-15 ENCOUNTER — Encounter (HOSPITAL_COMMUNITY): Payer: Self-pay

## 2022-07-15 DIAGNOSIS — E860 Dehydration: Secondary | ICD-10-CM | POA: Insufficient documentation

## 2022-07-15 DIAGNOSIS — Z79899 Other long term (current) drug therapy: Secondary | ICD-10-CM | POA: Insufficient documentation

## 2022-07-15 DIAGNOSIS — R0602 Shortness of breath: Secondary | ICD-10-CM | POA: Insufficient documentation

## 2022-07-15 DIAGNOSIS — I10 Essential (primary) hypertension: Secondary | ICD-10-CM | POA: Insufficient documentation

## 2022-07-15 DIAGNOSIS — R079 Chest pain, unspecified: Secondary | ICD-10-CM | POA: Diagnosis not present

## 2022-07-15 DIAGNOSIS — H5462 Unqualified visual loss, left eye, normal vision right eye: Secondary | ICD-10-CM | POA: Insufficient documentation

## 2022-07-15 DIAGNOSIS — N281 Cyst of kidney, acquired: Secondary | ICD-10-CM | POA: Diagnosis not present

## 2022-07-15 DIAGNOSIS — Z7984 Long term (current) use of oral hypoglycemic drugs: Secondary | ICD-10-CM | POA: Diagnosis not present

## 2022-07-15 DIAGNOSIS — R059 Cough, unspecified: Secondary | ICD-10-CM | POA: Diagnosis not present

## 2022-07-15 DIAGNOSIS — E119 Type 2 diabetes mellitus without complications: Secondary | ICD-10-CM | POA: Insufficient documentation

## 2022-07-15 DIAGNOSIS — Z7982 Long term (current) use of aspirin: Secondary | ICD-10-CM | POA: Diagnosis not present

## 2022-07-15 DIAGNOSIS — R42 Dizziness and giddiness: Secondary | ICD-10-CM | POA: Diagnosis not present

## 2022-07-15 DIAGNOSIS — H53131 Sudden visual loss, right eye: Secondary | ICD-10-CM | POA: Diagnosis not present

## 2022-07-15 DIAGNOSIS — I6522 Occlusion and stenosis of left carotid artery: Secondary | ICD-10-CM | POA: Diagnosis not present

## 2022-07-15 DIAGNOSIS — H539 Unspecified visual disturbance: Secondary | ICD-10-CM

## 2022-07-15 DIAGNOSIS — I700 Atherosclerosis of aorta: Secondary | ICD-10-CM | POA: Diagnosis not present

## 2022-07-15 DIAGNOSIS — R29818 Other symptoms and signs involving the nervous system: Secondary | ICD-10-CM | POA: Diagnosis not present

## 2022-07-15 DIAGNOSIS — R0789 Other chest pain: Secondary | ICD-10-CM | POA: Diagnosis not present

## 2022-07-15 LAB — ETHANOL: Alcohol, Ethyl (B): <10 mg/dL (ref <10)

## 2022-07-15 LAB — I-STAT CHEM 8, ED
BUN: 26 mg/dL — ABNORMAL HIGH (ref 6–20)
BUN: 23 mg/dL — ABNORMAL HIGH (ref 6–20)
Calcium, Ion: 1.21 mmol/L (ref 1.15–1.40)
Calcium, Ion: 1.02 mmol/L — ABNORMAL LOW (ref 1.15–1.40)
Chloride: 102 mmol/L (ref 98–111)
Chloride: 104 mmol/L (ref 98–111)
Creatinine, Ser: 1.7 mg/dL — ABNORMAL HIGH (ref 0.61–1.24)
Creatinine, Ser: 1.5 mg/dL — ABNORMAL HIGH (ref 0.61–1.24)
Glucose, Bld: 145 mg/dL — ABNORMAL HIGH (ref 70–99)
Glucose, Bld: 88 mg/dL (ref 70–99)
HCT: 40 % (ref 39.0–52.0)
HCT: 32 % — ABNORMAL LOW (ref 39.0–52.0)
Hemoglobin: 13.6 g/dL (ref 13.0–17.0)
Hemoglobin: 10.9 g/dL — ABNORMAL LOW (ref 13.0–17.0)
Potassium: 4.1 mmol/L (ref 3.5–5.1)
Potassium: 3.8 mmol/L (ref 3.5–5.1)
Sodium: 138 mmol/L (ref 135–145)
Sodium: 139 mmol/L (ref 135–145)
TCO2: 27 mmol/L (ref 22–32)
TCO2: 26 mmol/L (ref 22–32)

## 2022-07-15 LAB — HEPATIC FUNCTION PANEL
ALT: 16 U/L (ref 0–44)
AST: 16 U/L (ref 15–41)
Albumin: 4 g/dL (ref 3.5–5.0)
Alkaline Phosphatase: 70 U/L (ref 38–126)
Bilirubin, Direct: <0.1 mg/dL (ref 0.0–0.2)
Total Bilirubin: 0.6 mg/dL (ref 0.3–1.2)
Total Protein: 7.5 g/dL (ref 6.5–8.1)

## 2022-07-15 LAB — DIFFERENTIAL
Abs Immature Granulocytes: 0.03 10*3/uL (ref 0.00–0.07)
Basophils Absolute: 0 10*3/uL (ref 0.0–0.1)
Basophils Relative: 0 %
Eosinophils Absolute: 0 10*3/uL (ref 0.0–0.5)
Eosinophils Relative: 0 %
Immature Granulocytes: 0 %
Lymphocytes Relative: 49 %
Lymphs Abs: 3.6 10*3/uL (ref 0.7–4.0)
Monocytes Absolute: 0.4 10*3/uL (ref 0.1–1.0)
Monocytes Relative: 5 %
Neutro Abs: 3.5 10*3/uL (ref 1.7–7.7)
Neutrophils Relative %: 46 %

## 2022-07-15 LAB — CBC
HCT: 37.8 % — ABNORMAL LOW (ref 39.0–52.0)
Hemoglobin: 12.8 g/dL — ABNORMAL LOW (ref 13.0–17.0)
MCH: 29 pg (ref 26.0–34.0)
MCHC: 33.9 g/dL (ref 30.0–36.0)
MCV: 85.5 fL (ref 80.0–100.0)
Platelets: 265 10*3/uL (ref 150–400)
RBC: 4.42 MIL/uL (ref 4.22–5.81)
RDW: 14 % (ref 11.5–15.5)
WBC: 7.7 10*3/uL (ref 4.0–10.5)
nRBC: 0 % (ref 0.0–0.2)

## 2022-07-15 LAB — BASIC METABOLIC PANEL
Anion gap: 9 (ref 5–15)
BUN: 27 mg/dL — ABNORMAL HIGH (ref 6–20)
CO2: 20 mmol/L — ABNORMAL LOW (ref 22–32)
Calcium: 9 mg/dL (ref 8.9–10.3)
Chloride: 104 mmol/L (ref 98–111)
Creatinine, Ser: 1.78 mg/dL — ABNORMAL HIGH (ref 0.61–1.24)
GFR, Estimated: 44 mL/min — ABNORMAL LOW (ref >60)
Glucose, Bld: 226 mg/dL — ABNORMAL HIGH (ref 70–99)
Potassium: 4.3 mmol/L (ref 3.5–5.1)
Sodium: 133 mmol/L — ABNORMAL LOW (ref 135–145)

## 2022-07-15 LAB — RAPID URINE DRUG SCREEN, HOSP PERFORMED
Amphetamines: NOT DETECTED
Barbiturates: NOT DETECTED
Benzodiazepines: NOT DETECTED
Cocaine: NOT DETECTED
Opiates: NOT DETECTED
Tetrahydrocannabinol: NOT DETECTED

## 2022-07-15 LAB — TROPONIN I (HIGH SENSITIVITY)
Troponin I (High Sensitivity): 3 ng/L (ref <18)
Troponin I (High Sensitivity): 3 ng/L (ref <18)

## 2022-07-15 LAB — APTT: aPTT: 28 seconds (ref 24–36)

## 2022-07-15 LAB — PROTIME-INR
INR: 1.1 (ref 0.8–1.2)
Prothrombin Time: 14.7 seconds (ref 11.4–15.2)

## 2022-07-15 MED ORDER — IOHEXOL 350 MG/ML SOLN
80.0000 mL | Freq: Once | INTRAVENOUS | Status: AC | PRN
  Administered 2022-07-15: 80 mL via INTRAVENOUS

## 2022-07-15 MED ORDER — GADOBUTROL 1 MMOL/ML IV SOLN
10.0000 mL | Freq: Once | INTRAVENOUS | Status: AC | PRN
  Administered 2022-07-15: 10 mL via INTRAVENOUS

## 2022-07-15 MED ORDER — MECLIZINE HCL 25 MG PO TABS: 25.0000 mg | ORAL_TABLET | Freq: Three times a day (TID) | ORAL | 0 refills | Status: DC | PRN

## 2022-07-15 MED ORDER — SODIUM CHLORIDE 0.9 % IV BOLUS
1000.0000 mL | Freq: Once | INTRAVENOUS | Status: AC
  Administered 2022-07-15: 1000 mL via INTRAVENOUS

## 2022-07-15 MED ORDER — FENTANYL CITRATE PF 50 MCG/ML IJ SOSY
50.0000 ug | PREFILLED_SYRINGE | Freq: Once | INTRAMUSCULAR | Status: AC
  Administered 2022-07-15: 50 ug via INTRAVENOUS
  Filled 2022-07-15: qty 1

## 2022-07-15 MED ORDER — MECLIZINE HCL 25 MG PO TABS
25.0000 mg | ORAL_TABLET | Freq: Once | ORAL | Status: AC
  Administered 2022-07-15: 25 mg via ORAL
  Filled 2022-07-15: qty 1

## 2022-07-15 MED ORDER — ONDANSETRON HCL 4 MG/2ML IJ SOLN
4.0000 mg | Freq: Once | INTRAMUSCULAR | Status: AC
  Administered 2022-07-15: 4 mg via INTRAVENOUS
  Filled 2022-07-15: qty 2

## 2022-07-15 NOTE — ED Notes (Signed)
Unsuccessful IV attempt x2.  

## 2022-07-15 NOTE — ED Notes (Signed)
Patient transported to MRI 

## 2022-07-15 NOTE — ED Triage Notes (Signed)
Pt arrived via POV. C/o central chest pain, nausea and SOB since 0700 today. No cardiac hx other than a stent placed in 2007.  Pt takes Lisinopril  Aox4

## 2022-07-15 NOTE — ED Notes (Signed)
Attempted to perform orthostatic vitals. Pt stated he was dizzy while sitting bedside. When attempting standing vitals, pt stated he was dizzy and needed to sit down and pt was wobbly standing therefore could not finish getting last vitals.

## 2022-07-15 NOTE — Discharge Instructions (Signed)

## 2022-07-15 NOTE — ED Notes (Signed)
Pt ambulated in room, stated he was not dizzy. Tolerated well.

## 2022-07-15 NOTE — ED Provider Notes (Signed)
[facility] AT [facility] Provider Note   CSN: [id number] Arrival date & time: 07/15/22  1502     History  Chief Complaint  Patient presents with   Chest Pain   Shortness of Breath   Nausea    [name] is a 57 y.o. male.  Who presents emergency department with chief complaint of chest pain and shortness of breath.  He has a past medical history of diabetes, hypertension, previous stent placement in 2007.  He has not been taking medications due to lack of employment.  He reports that he does take his blood pressure medication.  He reports that 3 weeks ago he lost vision in his right eye.  He did not seek any attention for it.  Last night he had a terrible headache that kept him awake the entire night.  He states that gradually it resolved around 6 AM.  He went into work and at 8:30 AM he had sudden onset of shortness of breath followed shortly thereafter by severe chest pain located retrosternally radiating to his back.  He states that he began retching but was unable to vomit.  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  He states that it buckled underneath him but then improved and since that time he has had significant ataxia when he tries to ambulate.  He denies any headache or changes in vision other than that stated previously which occurred 3 weeks ago.  He denies unilateral weakness.  His wife was bedside states that his his ambulatory status is extremely abnormal for him.  He has had severe 10 out of 10 chest pain since 8:30 AM this morning.  He states that he tried to continue working without improvement and Aviral eventually came here for further evaluation.   Chest Pain Associated symptoms: shortness of breath   Shortness of Breath Associated symptoms: chest pain        Home Medications Prior to Admission medications   Medication Sig Start Date End Date Taking? Authorizing Provider   aspirin EC 81 MG tablet Take 81 mg by mouth daily. Swallow whole.    [provider]  citalopram (CELEXA) 20 MG tablet Take 20 mg by mouth daily. 08/28/21   [provider]  FARXIGA 10 MG TABS tablet Take 10 mg by mouth daily. 11/20/15   [provider]  glimepiride (AMARYL) 4 MG tablet Take 4 mg by mouth 2 (two) times daily.    [provider]  hydrochlorothiazide (HYDRODIURIL) 25 MG tablet Take 25 mg by mouth daily. 08/28/21   [provider]  lisinopril (PRINIVIL,ZESTRIL) 40 MG tablet Take 40 mg by mouth daily.  01/17/15   [provider]  lovastatin (MEVACOR) 40 MG tablet Take 80 mg by mouth daily. 08/28/21   [provider]  metFORMIN (GLUCOPHAGE) 850 MG tablet Take 1 tablet (850 mg total) by mouth 3 (three) times daily. 07/03/13   Piepenbrink, Victorino Dike, PA-C  metoprolol tartrate (LOPRESSOR) 50 MG tablet Take 50 mg by mouth daily.    [provider]  oxyCODONE-acetaminophen (PERCOCET) 10-325 MG tablet Take 1 tablet by mouth 3 (three) times daily as needed for pain. 12/15/21   [provider]  OZEMPIC, 2 MG/DOSE, 8 MG/3ML SOPN Inject 2 mg into the skin once a week. 12/15/21   [provider]  pantoprazole (PROTONIX) 40 MG tablet Take 40 mg by mouth daily. 02/18/20   [provider]  pioglitazone (ACTOS) 30 MG tablet Take 30 mg by mouth daily. 12/22/21   [provider]  traMADol (ULTRAM) 50 MG tablet Take 50 mg by mouth every 8 (eight) hours as needed for moderate pain. 12/15/21   [provider]      Allergies    Patient has no known allergies.    Review of Systems   Review of Systems  Respiratory:  Positive for shortness of breath.   Cardiovascular:  Positive for chest pain.    Physical Exam Updated Vital Signs BP 132/66 (BP Location: Left Arm)   Pulse 70   Temp 98.5 F (36.9 C) (Oral)   Resp 18   Ht 6\' 2"  (1.88 m)   Wt 113.4 kg   SpO2 100%   BMI 32.10 kg/m   Physical Exam Vitals and nursing note reviewed.  Constitutional:      General: He is not in acute distress.    Appearance: He is well-developed. He is not diaphoretic.  HENT:     Head: Normocephalic and atraumatic.  Eyes:     General: No scleral icterus.    Conjunctiva/sclera: Conjunctivae normal.  Cardiovascular:     Rate and Rhythm: Normal rate and regular rhythm.     Heart sounds: Normal heart sounds.  Pulmonary:     Effort: Pulmonary effort is normal. No respiratory distress.     Breath sounds: Normal breath sounds.  Abdominal:     Palpations: Abdomen is soft.     Tenderness: There is no abdominal tenderness.  Musculoskeletal:     Cervical back: Normal range of motion and neck supple.  Skin:    General: Skin is warm and dry.  Neurological:     Mental Status: He is alert.     GCS: GCS eye subscore is 4. GCS verbal subscore is 5. GCS motor subscore is 6.     Cranial Nerves: Cranial nerves 2-12 are intact.     Motor: Motor function is intact. No weakness.     Deep Tendon Reflexes: Reflexes normal.     Comments: And is off balance. He is unable to walk without assistance due to ataxia and disequilibrium  Psychiatric:        Behavior: Behavior normal.     ED Results / Procedures / Treatments   Labs (all labs ordered are listed, but only abnormal results are displayed) Labs Reviewed  CBC - Abnormal; Notable for the following components:      Result Value   Hemoglobin 12.8 (*)    HCT 37.8 (*)    All other components within normal limits  BASIC METABOLIC PANEL  ETHANOL  PROTIME-INR  APTT  DIFFERENTIAL  RAPID URINE DRUG SCREEN, HOSP PERFORMED  HEPATIC FUNCTION PANEL  I-STAT CHEM 8, ED  I-STAT CHEM 8, ED  TROPONIN I (HIGH SENSITIVITY)    EKG EKG Interpretation  Date/Time:  Thursday July 15 2022 15:15:19 EDT Ventricular Rate:  76 PR Interval:  172 QRS Duration: 93 QT Interval:  376 QTC Calculation: 423 R Axis:   -11 Text Interpretation: Sinus rhythm  Abnormal R-wave progression, early transition Similar to previous Confirmed by Coralee Pesa [id number]) on 07/15/2022 3:53:36 PM  Radiology DG Chest 2 View  Result Date: 07/15/2022 CLINICAL DATA:  Chest pain, shortness of breath, and cough EXAM: CHEST - 2 VIEW COMPARISON:  Chest radiograph dated 02/26/2022 FINDINGS: Normal lung volumes. No focal consolidations. No pleural effusion or pneumothorax. The heart size and mediastinal contours are within normal limits. No acute  osseous abnormality. IMPRESSION: No active cardiopulmonary disease. Electronically Signed   By: Agustin Cree M.D.   On: 07/15/2022 15:39    Procedures .Critical Care  Performed by: Arthor Captain, PA-C Authorized by: Arthor Captain, PA-C   Critical care provider statement:    Critical care time (minutes):  75   Critical care time was exclusive of:  Separately billable procedures and treating other patients   Critical care was necessary to treat or prevent imminent or life-threatening deterioration of the following conditions:  Dehydration (multipleI IV narcotics)   Critical care was time spent personally by me on the following activities:  Development of treatment plan with patient or surrogate, discussions with consultants, evaluation of patient's response to treatment, examination of patient, ordering and review of laboratory studies, ordering and review of radiographic studies, ordering and performing treatments and interventions, pulse oximetry, re-evaluation of patient's condition and review of old charts     Medications Ordered in ED Medications  fentaNYL (SUBLIMAZE) injection 50 mcg (has no administration in time range)  ondansetron (ZOFRAN) injection 4 mg (has no administration in time range)    ED Course/ Medical Decision Making/ A&P Clinical Course as of 07/15/22 2339  Thu Jul 15, 2022  1633 Creatinine(!): 1.70 [AH]    Clinical Course User Index [AH] Arthor Captain, PA-C                             Medical  Decision Making Amount and/or Complexity of Data Reviewed Labs: ordered. Decision-making details documented in ED Course. Radiology: ordered.  Risk Prescription drug management.   4:14 PM  This patient presents to the ED with chief complaint(s) of chest pain with abnormal neurologic deficits with pertinent past medical history of poor medication compliance, lack of access to care, diabetes, previous stent placement in 2007 which further complicates the presenting complaint. The complaint involves an extensive differential diagnosis and treatment options and also carries with it a high risk of complications and morbidity.    The differential diagnosis includes differential diagnosis includes acute coronary syndrome, aortic dissection, stroke, likely pulmonary embolus.  The initial plan is to order treat pain, obtain labs, EKG reviewed and is reassuring, maintain cardiac monitoring, and obtain a CT angiogram of the chest abdomen pelvis for evaluation of potential dissection as well as a head CT.   Additional Tests and treatment considered:   Additional history obtained: Additional history obtained from family   Reassessment and review (also see workup area): Lab Tests: I Ordered, and personally interpreted labs.  The pertinent results include:   I reviewed patient's labs.  He has elevated blood sugar, creatinine 1.78 initially.  CBC shows baseline low hemoglobin.  Troponin negative x 2, hepatic function normalized.  Repeat creatinine improved after fluids  Imaging Studies: I ordered and independently visualized and interpreted the following imaging CT scan ang c/a/p, ct head, X-ray chest, and MRI / mra  head/nk/brain   which showed no acute findings The interpretation of the imaging was limited to assessing for emergent pathology, for which purpose it was ordered.  Consultations Obtained: I requested consultation with the consultant Dr. Marcy Panning recommends checking orthostatis and  treating for near syncope ,  Medicines ordered and prescription drug management: I ordered the following medications pain for dehydration and dizziness  I considered this additional medications: ntg, heparin   Reevaluation of the patient after these medicines showed that the patient    resolved  Social Determinants of Health:  SDOH Screenings   Tobacco Use: Low Risk  (07/15/2022)     Cardiac Monitoring: The patient was maintained on a cardiac monitor.  I personally viewed and interpreted the cardiac monitor which showed an underlying rhythm of:  sinus rhythm  MDM: Patient here with severe chest pain and abnormal neurologic exam with history of known stent in the coronary arteries.  Initial concern for ACS, dissection, stroke.  After review of all data points there does not appear to be any acute life limb or organ threatening illness.  He does have some I vision changes noted during reassessment which have been present for 3 weeks.  Patient will be referred to ophthalmology.  He was given fluids and meclizine and able to stand without dizziness or ataxia no longer has chest pain and feels significantly better.  Suspect ultimately that he had just very bad dehydration and will be discharged at this time with close outpatient follow-up ambulatory referral to cardiology.  Disposition: After consideration of the diagnostic results and the patient's response to treatment,  I feel that the patent would benefit from discharge   .          Final Clinical Impression(s) / ED Diagnoses Final diagnoses:  Chest pain, unspecified type  Dizziness  Dehydration  Vision changes    Rx / DC Orders ED Discharge Orders     None         Arthor Captain, PA-C 07/16/22 1524    Horton, Clabe Seal, DO 07/30/22 [phone]

## 2022-08-30 ENCOUNTER — Emergency Department (HOSPITAL_COMMUNITY)
Admission: EM | Admit: 2022-08-30 | Discharge: 2022-08-30 | Disposition: A | Discharge status: Home / Self Care | Payer: 59 | Attending: Emergency Medicine | Admitting: Emergency Medicine

## 2022-08-30 ENCOUNTER — Other Ambulatory Visit: Payer: Self-pay

## 2022-08-30 ENCOUNTER — Encounter (HOSPITAL_COMMUNITY): Payer: Self-pay

## 2022-08-30 DIAGNOSIS — Z7982 Long term (current) use of aspirin: Secondary | ICD-10-CM | POA: Diagnosis not present

## 2022-08-30 DIAGNOSIS — K0889 Other specified disorders of teeth and supporting structures: Secondary | ICD-10-CM | POA: Diagnosis not present

## 2022-08-30 DIAGNOSIS — R519 Headache, unspecified: Secondary | ICD-10-CM | POA: Diagnosis not present

## 2022-08-30 MED ORDER — AMOXICILLIN-POT CLAVULANATE 875-125 MG PO TABS: 1.0000 | ORAL_TABLET | Freq: Two times a day (BID) | ORAL | 0 refills | Status: DC

## 2022-08-30 MED ORDER — KETOROLAC TROMETHAMINE 15 MG/ML IJ SOLN
15.0000 mg | Freq: Once | INTRAMUSCULAR | Status: AC
  Administered 2022-08-30: 15 mg via INTRAMUSCULAR
  Filled 2022-08-30: qty 1

## 2022-08-30 MED ORDER — ETODOLAC 400 MG PO TABS: 400.0000 mg | ORAL_TABLET | Freq: Two times a day (BID) | ORAL | 0 refills | Status: DC

## 2022-08-30 MED ORDER — HYDROCODONE-ACETAMINOPHEN 5-325 MG PO TABS: 1.0000 | ORAL_TABLET | Freq: Four times a day (QID) | ORAL | 0 refills | Status: DC | PRN

## 2022-08-30 NOTE — ED Provider Notes (Signed)
[place] EMERGENCY DEPARTMENT AT [facility] Provider Note   CSN: [id number] Arrival date & time: 08/30/22  1736     History  Chief Complaint  Patient presents with   Headache    [name] is a 57 y.o. male.  57 year old male presents today for evaluation of left-sided facial pain ongoing since this morning.  No vision change, dizziness, lightheadedness associated with this.  States that it has been difficult to chew on that side.  No fever, or chills.  Has not taken a COVID prior to arrival.  No history of migraines or headaches.   The history is provided by the patient. No language interpreter was used.       Home Medications Prior to Admission medications   Medication Sig Start Date End Date Taking? Authorizing Provider  aspirin EC 81 MG tablet Take 81 mg by mouth daily. Swallow whole.    [provider]  citalopram (CELEXA) 20 MG tablet Take 20 mg by mouth daily. 08/28/21   [provider]  FARXIGA 10 MG TABS tablet Take 10 mg by mouth daily. 11/20/15   [provider]  glimepiride (AMARYL) 4 MG tablet Take 4 mg by mouth 2 (two) times daily.    [provider]  hydrochlorothiazide (HYDRODIURIL) 25 MG tablet Take 25 mg by mouth daily. 08/28/21   [provider]  lisinopril (PRINIVIL,ZESTRIL) 40 MG tablet Take 40 mg by mouth daily.  01/17/15   [provider]  lovastatin (MEVACOR) 40 MG tablet Take 80 mg by mouth daily. 08/28/21   [provider]  meclizine (ANTIVERT) 25 MG tablet Take 1 tablet (25 mg total) by mouth 3 (three) times daily as needed for dizziness. 07/15/22   Arthor Captain, PA-C  metFORMIN (GLUCOPHAGE) 850 MG tablet Take 1 tablet (850 mg total) by mouth 3 (three) times daily. 07/03/13   Piepenbrink, Victorino Dike, PA-C  metoprolol tartrate (LOPRESSOR) 50 MG tablet Take 50 mg by mouth daily.    [provider]  oxyCODONE-acetaminophen (PERCOCET) 10-325 MG tablet Take 1 tablet by mouth 3  (three) times daily as needed for pain. 12/15/21   [provider]  OZEMPIC, 2 MG/DOSE, 8 MG/3ML SOPN Inject 2 mg into the skin once a week. 12/15/21   [provider]  pantoprazole (PROTONIX) 40 MG tablet Take 40 mg by mouth daily. 02/18/20   [provider]  pioglitazone (ACTOS) 30 MG tablet Take 30 mg by mouth daily. 12/22/21   [provider]  traMADol (ULTRAM) 50 MG tablet Take 50 mg by mouth every 8 (eight) hours as needed for moderate pain. 12/15/21   [provider]      Allergies    Patient has no known allergies.    Review of Systems   Review of Systems  Constitutional:  Negative for fever.  HENT:  Positive for dental problem. Negative for drooling, trouble swallowing and voice change.   Neurological:  Positive for headaches.  All other systems reviewed and are negative.   Physical Exam Updated Vital Signs BP 123/79 (BP Location: Left Arm)   Pulse 74   Temp 98.4 F (36.9 C) (Oral)   Resp 16   Ht 6\' 2"  (1.88 m)   Wt 113.4 kg   SpO2 99%   BMI 32.10 kg/m  Physical Exam Vitals and nursing note reviewed.  Constitutional:      General: He is not in acute distress.    Appearance: Normal appearance. He is not ill-appearing.  HENT:  Head: Normocephalic and atraumatic.     Nose: Nose normal.     Mouth/Throat:     Comments: No evidence of retropharyngeal abscess, peritonsillar abscess, Ludwig's angina.  Overall poor dentition noted.  Tenderness to palpation over left upper posterior tooth that has a gold cap on it.  Patient states this is the same pain he has been having throughout today. Eyes:     Extraocular Movements: Extraocular movements intact.     Conjunctiva/sclera: Conjunctivae normal.     Pupils: Pupils are equal, round, and reactive to light.  Pulmonary:     Effort: Pulmonary effort is normal. No respiratory distress.  Musculoskeletal:        General: No deformity.  Skin:    Findings: No rash.  Neurological:      General: No focal deficit present.     Mental Status: He is alert and oriented to person, place, and time. Mental status is at baseline.    ED Results / Procedures / Treatments   Labs (all labs ordered are listed, but only abnormal results are displayed) Labs Reviewed - No data to display  EKG None  Radiology No results found.  Procedures Procedures    Medications Ordered in ED Medications - No data to display  ED Course/ Medical Decision Making/ A&P                             Medical Decision Making  57 year old male presents today for left-sided facial pain.  On exam he is overall poor dentition.  Reproducible pain with palpation of the tooth on the left upper side with ago.  He states this was placed about 15 years ago.  He was recently seen by dentist last weekend and had to cavities filled as well as a root canal.  No fever.  No facial swelling.  Without focal neurological deficits.  He is appropriate for discharge.  Discharged in stable condition.  Started on Augmentin, provided Lodine, and short course of pain medication.   Final Clinical Impression(s) / ED Diagnoses Final diagnoses:  Pain, dental    Rx / DC Orders ED Discharge Orders          Ordered    etodolac (LODINE) 400 MG tablet  2 times daily        08/30/22 2001    HYDROcodone-acetaminophen (NORCO/VICODIN) 5-325 MG tablet  Every 6 hours PRN        08/30/22 2001    amoxicillin-clavulanate (AUGMENTIN) 875-125 MG tablet  Every 12 hours        08/30/22 2001              Marita Kansas, PA-C 08/30/22 2002    Pricilla Loveless, MD 09/01/22 [phone]

## 2022-08-30 NOTE — Discharge Instructions (Addendum)
Your pain is likely coming from dental pain.  We were able to reproduce the pain with palpation with the left upper tooth with the gold cap on it.  I sent antibiotic into the pharmacy for you along with anti-inflammatory medication and pain medication.  The preservative pain medication for breakthrough pain.  Do not combine the anti-inflammatory medication with ibuprofen or Aleve.  You can take Tylenol in addition to these medications.  Return for any concerning symptoms otherwise follow-up with your dentist.

## 2022-08-31 IMAGING — CT CT ABD-PELV W/O CM
2 of 4 series · 14 of 36 positions shown, 17 images · non-contrast
Comparison: CT chest angiogram, 11/01/2018

CLINICAL DATA: Persistent cough, lower chest and abdominal pain

EXAM:
CT CHEST, ABDOMEN AND PELVIS WITHOUT CONTRAST
TECHNIQUE: Multidetector CT imaging of the chest, abdomen and pelvis was
performed following the standard protocol without IV contrast.

[Series 3: cap wo 5.0 i31f 2 · axial · 0.98mm/px · z∈[+852,+1467]mm · 11 of 147 slices shown, 14 images]
[im 12/147  mediastinal]
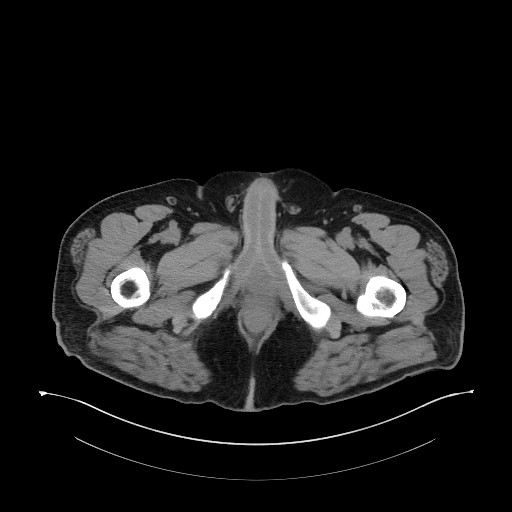
[im 12/147  lung]
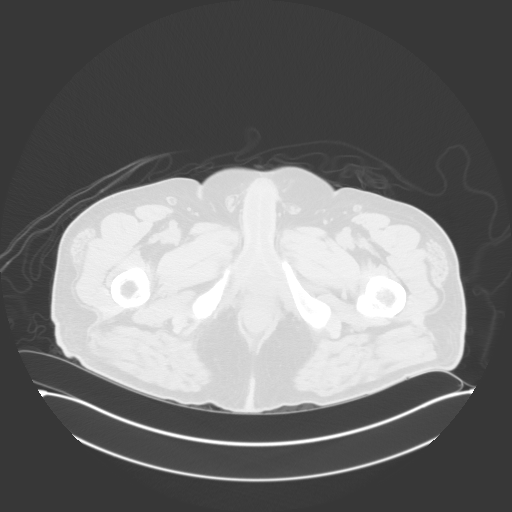
[im 23/147  lung]
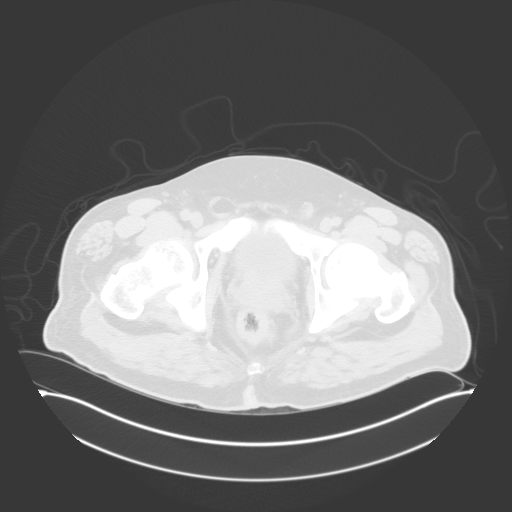
[im 34/147  lung]
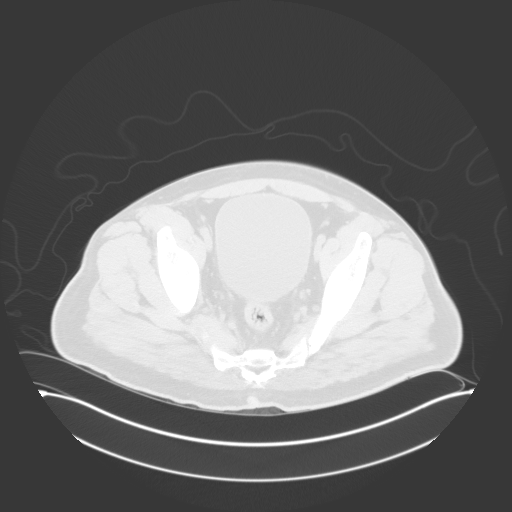
[im 45/147  lung]
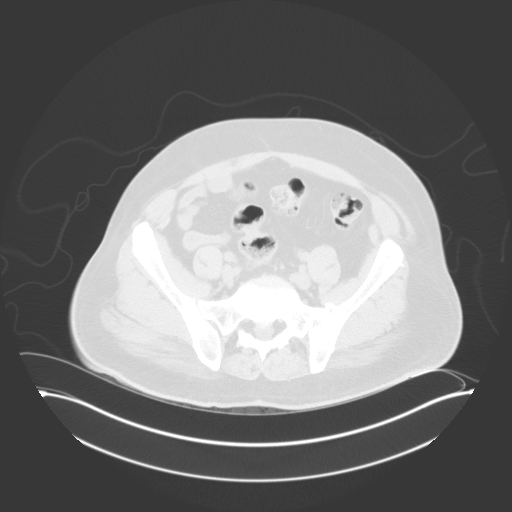
[im 57/147  mediastinal]
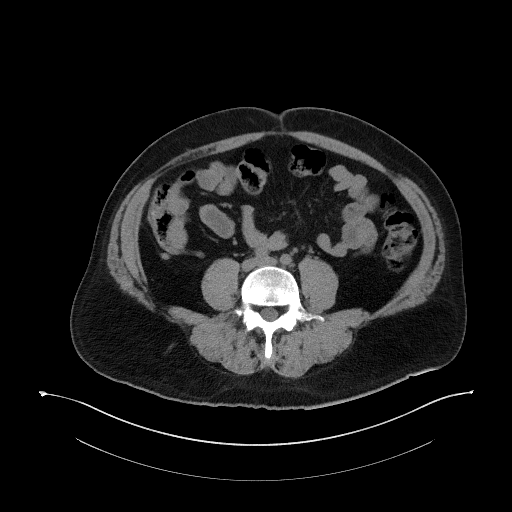
[im 57/147  lung]
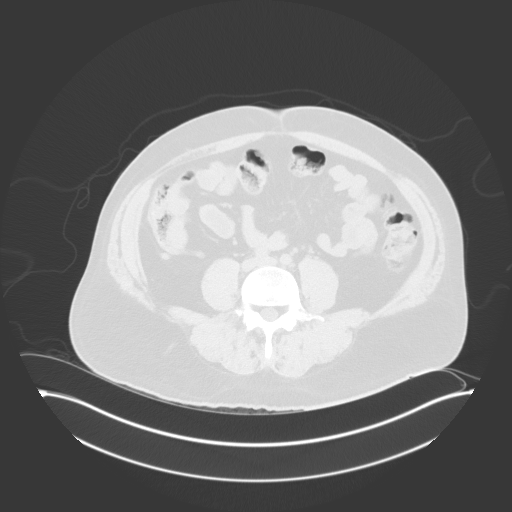
[im 79/147  lung]
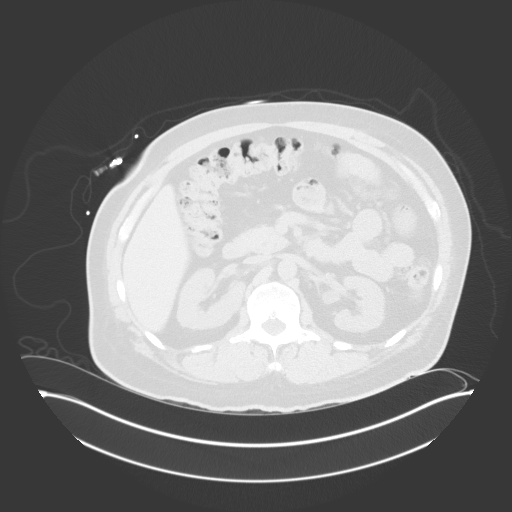
[im 90/147  lung]
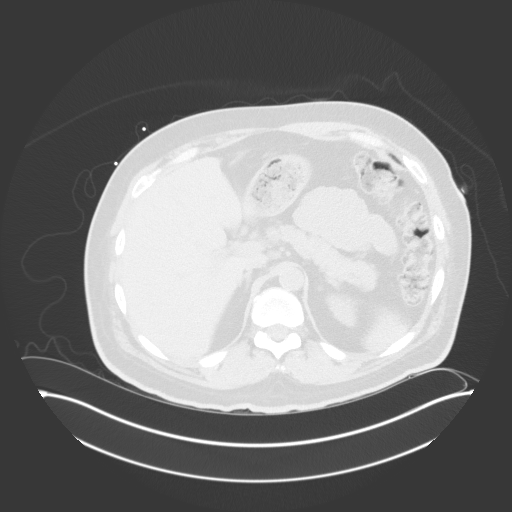
[im 102/147  lung]
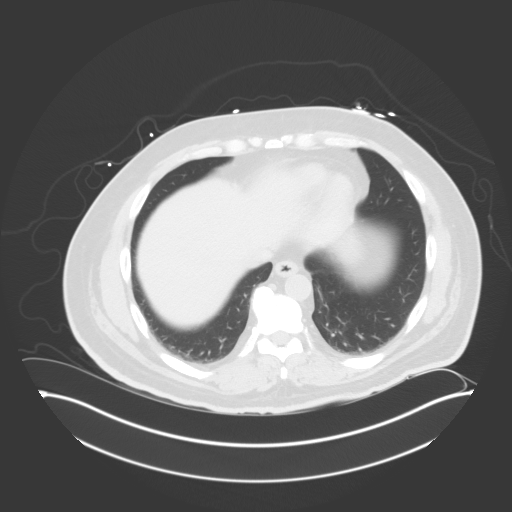
[im 113/147  mediastinal]
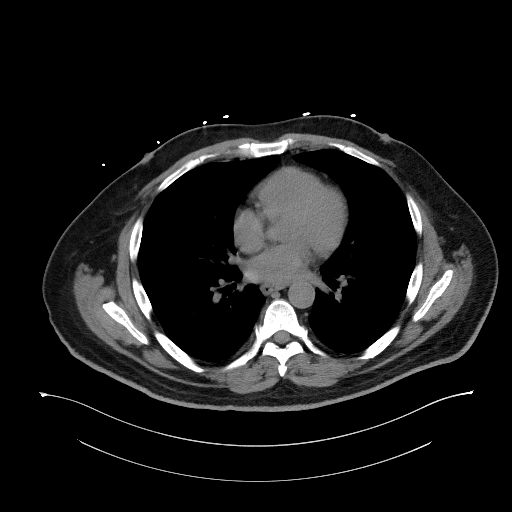
[im 113/147  lung]
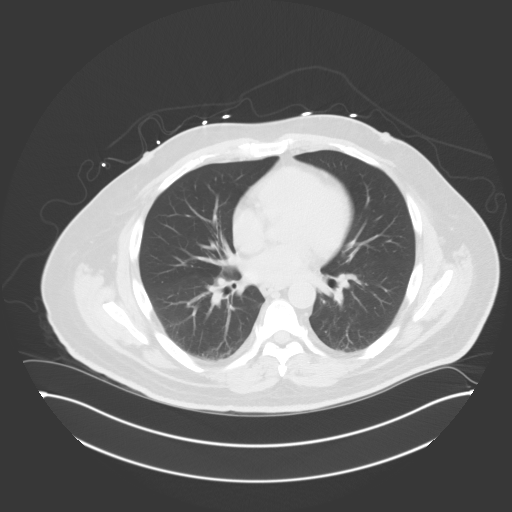
[im 124/147  lung]
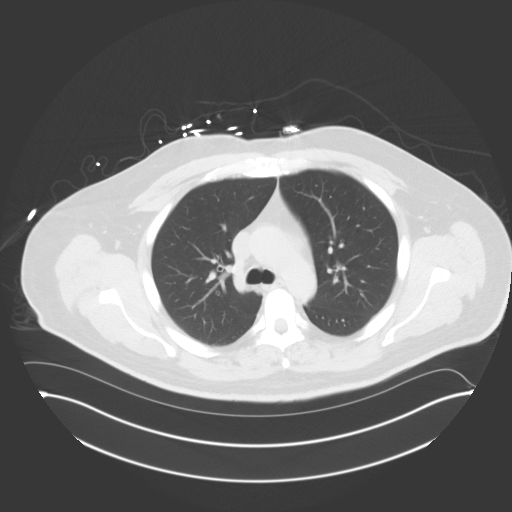
[im 135/147  lung]
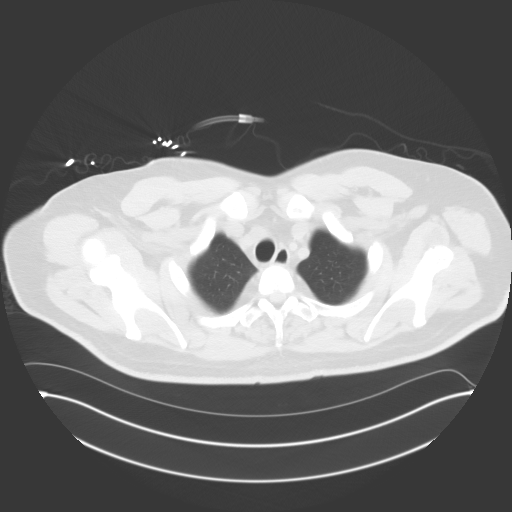

[Series 6: coronal · coronal · 0.95mm/px · 3 of 156 slices shown]
[im 32/156  lung]
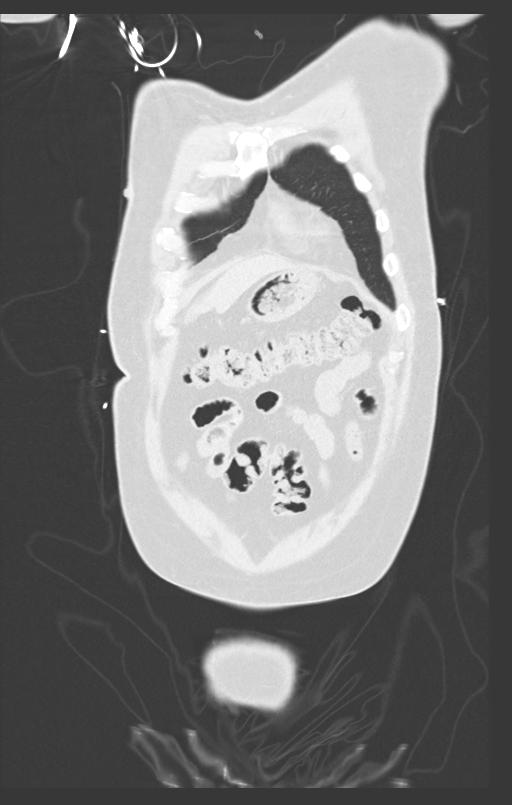
[im 63/156  lung]
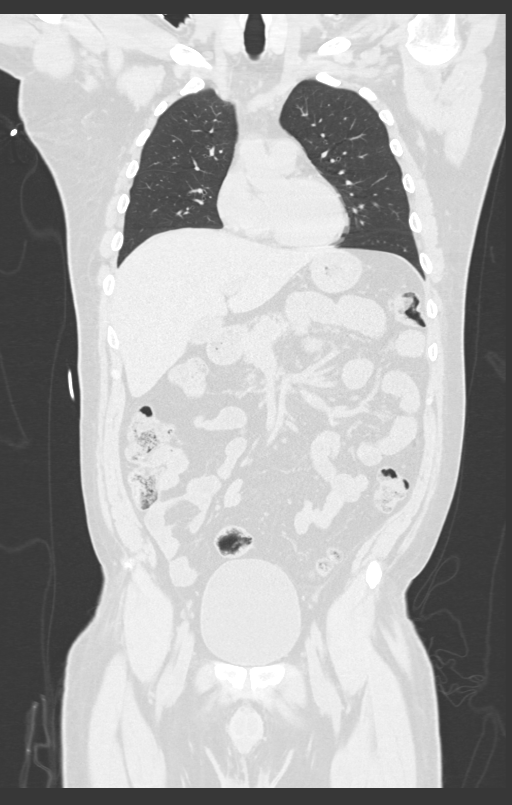
[im 94/156  lung]
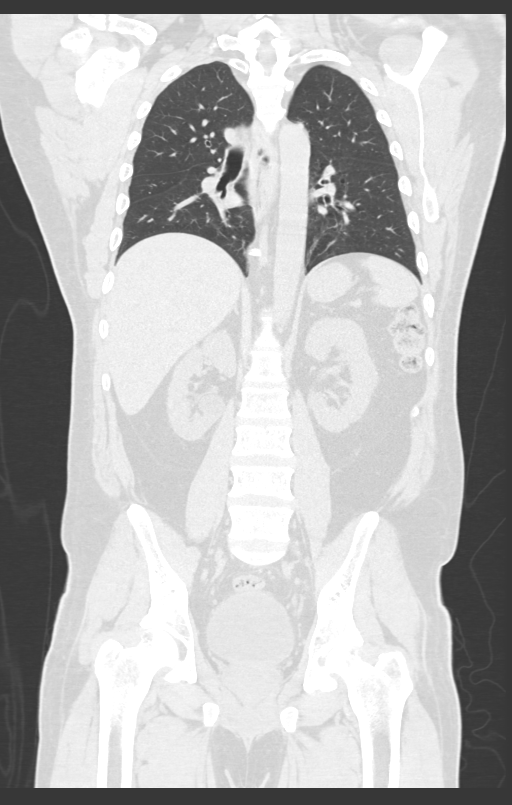

[14 of 36 positions shown; findings below may reference images not displayed]

FINDINGS: CT CHEST FINDINGS

Cardiovascular: No significant vascular findings. Normal heart size.
No pericardial effusion.

Mediastinum/Nodes: No enlarged mediastinal, hilar, or axillary lymph
nodes. Thyroid gland, trachea, and esophagus demonstrate no
significant findings.

Lungs/Pleura: Minimal dependent bibasilar scarring and or
atelectasis. No pleural effusion or pneumothorax.

Musculoskeletal: No chest wall mass or suspicious bone lesions
identified.

CT ABDOMEN PELVIS FINDINGS

Hepatobiliary: No solid liver abnormality is seen. No gallstones,
gallbladder wall thickening, or biliary dilatation.

Pancreas: Unremarkable. No pancreatic ductal dilatation or
surrounding inflammatory changes.

Spleen: Normal in size without significant abnormality.

Adrenals/Urinary Tract: Adrenal glands are unremarkable. Kidneys are
normal, without renal calculi, solid lesion, or hydronephrosis.
Bladder is unremarkable.

Stomach/Bowel: Stomach is within normal limits. Appendix appears
normal. No evidence of bowel wall thickening, distention, or
inflammatory changes.

Vascular/Lymphatic: No significant vascular findings are present. No
enlarged abdominal or pelvic lymph nodes.

Reproductive: Prostatomegaly.

Other: No abdominal wall hernia or abnormality. No abdominopelvic
ascites.

Musculoskeletal: No acute or significant osseous findings.
IMPRESSION: 1. No non-contrast CT findings of the chest to explain cough.
Minimal dependent bibasilar scarring.
2. No noncontrast CT findings of the chest, abdomen, or pelvis to
explain pain.
3. Prostatomegaly.

## 2023-01-04 ENCOUNTER — Emergency Department (HOSPITAL_COMMUNITY)
Admission: EM | Admit: 2023-01-04 | Discharge: 2023-01-04 | Disposition: A | Discharge status: Home / Self Care | Payer: 59 | Attending: Emergency Medicine | Admitting: Emergency Medicine

## 2023-01-04 ENCOUNTER — Encounter (HOSPITAL_COMMUNITY): Payer: Self-pay

## 2023-01-04 ENCOUNTER — Other Ambulatory Visit: Payer: Self-pay

## 2023-01-04 DIAGNOSIS — Z79899 Other long term (current) drug therapy: Secondary | ICD-10-CM | POA: Insufficient documentation

## 2023-01-04 DIAGNOSIS — E1140 Type 2 diabetes mellitus with diabetic neuropathy, unspecified: Secondary | ICD-10-CM | POA: Diagnosis not present

## 2023-01-04 DIAGNOSIS — G629 Polyneuropathy, unspecified: Secondary | ICD-10-CM | POA: Diagnosis not present

## 2023-01-04 DIAGNOSIS — Z7984 Long term (current) use of oral hypoglycemic drugs: Secondary | ICD-10-CM | POA: Diagnosis not present

## 2023-01-04 DIAGNOSIS — Z7982 Long term (current) use of aspirin: Secondary | ICD-10-CM | POA: Diagnosis not present

## 2023-01-04 DIAGNOSIS — E1169 Type 2 diabetes mellitus with other specified complication: Secondary | ICD-10-CM | POA: Diagnosis not present

## 2023-01-04 DIAGNOSIS — E1165 Type 2 diabetes mellitus with hyperglycemia: Secondary | ICD-10-CM

## 2023-01-04 DIAGNOSIS — R209 Unspecified disturbances of skin sensation: Secondary | ICD-10-CM | POA: Diagnosis not present

## 2023-01-04 DIAGNOSIS — E1142 Type 2 diabetes mellitus with diabetic polyneuropathy: Secondary | ICD-10-CM

## 2023-01-04 DIAGNOSIS — E119 Type 2 diabetes mellitus without complications: Secondary | ICD-10-CM | POA: Insufficient documentation

## 2023-01-04 LAB — CBC WITH DIFFERENTIAL/PLATELET
Abs Immature Granulocytes: 0.02 10*3/uL (ref 0.00–0.07)
Basophils Absolute: 0 10*3/uL (ref 0.0–0.1)
Basophils Relative: 0 %
Eosinophils Absolute: 0 10*3/uL (ref 0.0–0.5)
Eosinophils Relative: 1 %
HCT: 39.1 % (ref 39.0–52.0)
Hemoglobin: 13.6 g/dL (ref 13.0–17.0)
Immature Granulocytes: 0 %
Lymphocytes Relative: 44 %
Lymphs Abs: 2.7 10*3/uL (ref 0.7–4.0)
MCH: 29.4 pg (ref 26.0–34.0)
MCHC: 34.8 g/dL (ref 30.0–36.0)
MCV: 84.6 fL (ref 80.0–100.0)
Monocytes Absolute: 0.4 10*3/uL (ref 0.1–1.0)
Monocytes Relative: 7 %
Neutro Abs: 3 10*3/uL (ref 1.7–7.7)
Neutrophils Relative %: 48 %
Platelets: 236 10*3/uL (ref 150–400)
RBC: 4.62 MIL/uL (ref 4.22–5.81)
RDW: 14.9 % (ref 11.5–15.5)
WBC: 6.1 10*3/uL (ref 4.0–10.5)
nRBC: 0 % (ref 0.0–0.2)

## 2023-01-04 LAB — BASIC METABOLIC PANEL
Anion gap: 8 (ref 5–15)
BUN: 16 mg/dL (ref 6–20)
CO2: 21 mmol/L — ABNORMAL LOW (ref 22–32)
Calcium: 8.9 mg/dL (ref 8.9–10.3)
Chloride: 103 mmol/L (ref 98–111)
Creatinine, Ser: 1.08 mg/dL (ref 0.61–1.24)
GFR, Estimated: >60 mL/min (ref >60)
Glucose, Bld: 457 mg/dL — ABNORMAL HIGH (ref 70–99)
Potassium: 4.4 mmol/L (ref 3.5–5.1)
Sodium: 132 mmol/L — ABNORMAL LOW (ref 135–145)

## 2023-01-04 LAB — CBG MONITORING, ED: Glucose-Capillary: 425 mg/dL — ABNORMAL HIGH (ref 70–99)

## 2023-01-04 MED ORDER — GABAPENTIN 100 MG PO CAPS: 100.0000 mg | ORAL_CAPSULE | Freq: Three times a day (TID) | ORAL | 1 refills | Status: AC

## 2023-01-04 NOTE — Discharge Instructions (Addendum)
Your blood sugar is 425 today. Your neuropathy is worsening because your diabetes is poorly controlled. Your neuropathy will worsen if you do not get your sugars under better control a nd you may need to consider insulin.

## 2023-01-04 NOTE — ED Provider Notes (Signed)
[place] EMERGENCY DEPARTMENT AT [facility] Provider Note   CSN: [id number] Arrival date & time: 01/04/23  1239     History  Chief Complaint  Patient presents with   Tingling    [name] is a 57 y.o. male with a past medical history of poorly controlled diabetes who presents emergency department with complaint of burning pain in his legs and a stocking distribution.  Patient is not sure what his last A1c was.  He has an upcoming appointment with his PCP.  He reports chronic neuropathy however it has gotten worse over the past 3 to 4 days.  He states that is difficult for him to walk due to the burning pain in his lower extremities.  He is not on medication for this.  He denies any injuries, chest pain shortness of breath or swelling.  HPI     Home Medications Prior to Admission medications   Medication Sig Start Date End Date Taking? Authorizing Provider  amoxicillin-clavulanate (AUGMENTIN) 875-125 MG tablet Take 1 tablet by mouth every 12 (twelve) hours. 08/30/22   Marita Kansas, PA-C  aspirin EC 81 MG tablet Take 81 mg by mouth daily. Swallow whole.    [provider]  citalopram (CELEXA) 20 MG tablet Take 20 mg by mouth daily. 08/28/21   [provider]  etodolac (LODINE) 400 MG tablet Take 1 tablet (400 mg total) by mouth 2 (two) times daily. 08/30/22   Karie Mainland, Amjad, PA-C  FARXIGA 10 MG TABS tablet Take 10 mg by mouth daily. 11/20/15   [provider]  glimepiride (AMARYL) 4 MG tablet Take 4 mg by mouth 2 (two) times daily.    [provider]  hydrochlorothiazide (HYDRODIURIL) 25 MG tablet Take 25 mg by mouth daily. 08/28/21   [provider]  HYDROcodone-acetaminophen (NORCO/VICODIN) 5-325 MG tablet Take 1-2 tablets by mouth every 6 (six) hours as needed. 08/30/22   Karie Mainland, Amjad, PA-C  lisinopril (PRINIVIL,ZESTRIL) 40 MG tablet Take 40 mg by mouth daily.  01/17/15   [provider]  lovastatin (MEVACOR) 40 MG tablet Take  80 mg by mouth daily. 08/28/21   [provider]  meclizine (ANTIVERT) 25 MG tablet Take 1 tablet (25 mg total) by mouth 3 (three) times daily as needed for dizziness. 07/15/22   Arthor Captain, PA-C  metFORMIN (GLUCOPHAGE) 850 MG tablet Take 1 tablet (850 mg total) by mouth 3 (three) times daily. 07/03/13   Piepenbrink, Victorino Dike, PA-C  metoprolol tartrate (LOPRESSOR) 50 MG tablet Take 50 mg by mouth daily.    [provider]  OZEMPIC, 2 MG/DOSE, 8 MG/3ML SOPN Inject 2 mg into the skin once a week. 12/15/21   [provider]  pantoprazole (PROTONIX) 40 MG tablet Take 40 mg by mouth daily. 02/18/20   [provider]  pioglitazone (ACTOS) 30 MG tablet Take 30 mg by mouth daily. 12/22/21   [provider]      Allergies    Patient has no known allergies.    Review of Systems   Review of Systems  Physical Exam Updated Vital Signs BP (!) 143/97 (BP Location: Left Arm)   Pulse 90   Temp 97.9 F (36.6 C) (Oral)   Resp 18   Ht 6\' 2"  (1.88 m)   Wt 104.3 kg   SpO2 99%   BMI 29.53 kg/m  Physical Exam Vitals and nursing note reviewed.  Constitutional:      General: He is not in acute distress.    Appearance:  He is well-developed. He is not diaphoretic.  HENT:     Head: Normocephalic and atraumatic.  Eyes:     General: No scleral icterus.    Conjunctiva/sclera: Conjunctivae normal.  Cardiovascular:     Rate and Rhythm: Normal rate and regular rhythm.     Pulses:          Dorsalis pedis pulses are 2+ on the right side and 2+ on the left side.       Posterior tibial pulses are 2+ on the right side and 3+ on the left side.     Heart sounds: Normal heart sounds.  Pulmonary:     Effort: Pulmonary effort is normal. No respiratory distress.     Breath sounds: Normal breath sounds.  Abdominal:     Palpations: Abdomen is soft.     Tenderness: There is no abdominal tenderness.  Musculoskeletal:     Cervical back: Normal range of motion and neck  supple.     Right foot: Normal range of motion. No deformity, Charcot foot or foot drop.     Left foot: Normal range of motion. No deformity, Charcot foot or foot drop.  Feet:     Right foot:     Skin integrity: Skin integrity normal. No ulcer.     Toenail Condition: Right toenails are normal.     Left foot:     Skin integrity: Skin integrity normal. No ulcer.     Toenail Condition: Left toenails are normal.  Skin:    General: Skin is warm and dry.  Neurological:     Mental Status: He is alert.     Sensory: Sensory deficit present.     Comments: On arrival sensory deficits from the knee down 2+ DP and PT pulse bilaterally.  Psychiatric:        Behavior: Behavior normal.     ED Results / Procedures / Treatments   Labs (all labs ordered are listed, but only abnormal results are displayed) Labs Reviewed  BASIC METABOLIC PANEL  CBC WITH DIFFERENTIAL/PLATELET  CBG MONITORING, ED    EKG None  Radiology No results found.  Procedures Procedures    Medications Ordered in ED Medications - No data to display  ED Course/ Medical Decision Making/ A&P                                 Medical Decision Making 57 year old who presents emergency department chief complaint of bilateral lower extremity burning pain.  Known history of diabetic peripheral neuropathy.  Patient does not have any obvious signs of vascular compromise with 2+ bilateral peripheral pulses. Of note patient has a blood sugar of 457.  Remainder of patient's labs are without significant abnormality.  This is a worsening in his peripheral neuropathy secondary to his poorly controlled diabetes.  Patient is advised to follow closely with PCP, work on better blood sugar control.  Will prescribe gabapentin 100 mg 3 times daily and he can increase the dose as per recommendations of PCP.  He appears otherwise appropriate for discharge at this time without any other emergent findings.  Amount and/or Complexity of Data  Reviewed Labs: ordered.  Risk Prescription drug management.           Final Clinical Impression(s) / ED Diagnoses Final diagnoses:  Diabetic peripheral neuropathy associated with type 2 diabetes mellitus (HCC)  Poorly controlled diabetes mellitus (HCC)    Rx / DC Orders ED Discharge  Orders     None         Arthor Captain, PA-C 01/04/23 2137    Charlynne Pander, MD 01/04/23 [phone]

## 2023-01-04 NOTE — ED Triage Notes (Signed)
Pt reports constant pain described as pins and needles tingling to bilateral feet and legs x2 weeks that is worse when walking.  Pt also reports feet are often cold to touch.  Hx of diabetes.

## 2023-03-16 ENCOUNTER — Other Ambulatory Visit: Payer: Self-pay

## 2023-03-16 ENCOUNTER — Encounter (HOSPITAL_COMMUNITY): Payer: Self-pay

## 2023-03-16 ENCOUNTER — Emergency Department (HOSPITAL_COMMUNITY)
Admission: EM | Admit: 2023-03-16 | Discharge: 2023-03-17 | Disposition: A | Discharge status: Home / Self Care | Payer: 59 | Attending: Emergency Medicine | Admitting: Emergency Medicine

## 2023-03-16 ENCOUNTER — Emergency Department (HOSPITAL_COMMUNITY): Payer: 59

## 2023-03-16 DIAGNOSIS — Z7984 Long term (current) use of oral hypoglycemic drugs: Secondary | ICD-10-CM | POA: Insufficient documentation

## 2023-03-16 DIAGNOSIS — J101 Influenza due to other identified influenza virus with other respiratory manifestations: Secondary | ICD-10-CM | POA: Diagnosis not present

## 2023-03-16 DIAGNOSIS — R079 Chest pain, unspecified: Secondary | ICD-10-CM

## 2023-03-16 DIAGNOSIS — Z7982 Long term (current) use of aspirin: Secondary | ICD-10-CM | POA: Diagnosis not present

## 2023-03-16 DIAGNOSIS — E1165 Type 2 diabetes mellitus with hyperglycemia: Secondary | ICD-10-CM | POA: Diagnosis not present

## 2023-03-16 DIAGNOSIS — Z794 Long term (current) use of insulin: Secondary | ICD-10-CM | POA: Insufficient documentation

## 2023-03-16 DIAGNOSIS — Z20822 Contact with and (suspected) exposure to covid-19: Secondary | ICD-10-CM | POA: Diagnosis not present

## 2023-03-16 DIAGNOSIS — E860 Dehydration: Secondary | ICD-10-CM | POA: Diagnosis not present

## 2023-03-16 DIAGNOSIS — R739 Hyperglycemia, unspecified: Secondary | ICD-10-CM

## 2023-03-16 DIAGNOSIS — R0602 Shortness of breath: Secondary | ICD-10-CM | POA: Diagnosis present

## 2023-03-16 LAB — BASIC METABOLIC PANEL
Anion gap: 15 (ref 5–15)
BUN: 20 mg/dL (ref 6–20)
CO2: 17 mmol/L — ABNORMAL LOW (ref 22–32)
Calcium: 8.9 mg/dL (ref 8.9–10.3)
Chloride: 98 mmol/L (ref 98–111)
Creatinine, Ser: 1.39 mg/dL — ABNORMAL HIGH (ref 0.61–1.24)
GFR, Estimated: 59 mL/min — ABNORMAL LOW (ref >60)
Glucose, Bld: 536 mg/dL (ref 70–99)
Potassium: 4.1 mmol/L (ref 3.5–5.1)
Sodium: 130 mmol/L — ABNORMAL LOW (ref 135–145)

## 2023-03-16 LAB — CBC
HCT: 46.4 % (ref 39.0–52.0)
Hemoglobin: 15.5 g/dL (ref 13.0–17.0)
MCH: 29.6 pg (ref 26.0–34.0)
MCHC: 33.4 g/dL (ref 30.0–36.0)
MCV: 88.5 fL (ref 80.0–100.0)
Platelets: 216 10*3/uL (ref 150–400)
RBC: 5.24 MIL/uL (ref 4.22–5.81)
RDW: 13.5 % (ref 11.5–15.5)
WBC: 4.4 10*3/uL (ref 4.0–10.5)
nRBC: 0 % (ref 0.0–0.2)

## 2023-03-16 LAB — TROPONIN I (HIGH SENSITIVITY): Troponin I (High Sensitivity): 5 ng/L (ref <18)

## 2023-03-16 LAB — RESP PANEL BY RT-PCR (RSV, FLU A&B, COVID)  RVPGX2
Influenza A by PCR: POSITIVE — AB
Influenza B by PCR: NEGATIVE
Resp Syncytial Virus by PCR: NEGATIVE
SARS Coronavirus 2 by RT PCR: NEGATIVE

## 2023-03-16 NOTE — ED Triage Notes (Signed)
Complaining of a cough since Sunday that is producing dark phlegm. Has been short of breath as well.

## 2023-03-17 DIAGNOSIS — J101 Influenza due to other identified influenza virus with other respiratory manifestations: Secondary | ICD-10-CM | POA: Diagnosis not present

## 2023-03-17 LAB — URINALYSIS, W/ REFLEX TO CULTURE (INFECTION SUSPECTED)
Bacteria, UA: NONE SEEN
Bilirubin Urine: NEGATIVE
Glucose, UA: >=500 mg/dL — AB
Hgb urine dipstick: NEGATIVE
Ketones, ur: NEGATIVE mg/dL
Leukocytes,Ua: NEGATIVE
Nitrite: NEGATIVE
Protein, ur: NEGATIVE mg/dL
Specific Gravity, Urine: 1.031 — ABNORMAL HIGH (ref 1.005–1.030)
pH: 5 (ref 5.0–8.0)

## 2023-03-17 LAB — BLOOD GAS, VENOUS
Acid-base deficit: 2.2 mmol/L — ABNORMAL HIGH (ref 0.0–2.0)
Bicarbonate: 23.7 mmol/L (ref 20.0–28.0)
O2 Saturation: 63.9 %
Patient temperature: 37
pCO2, Ven: 44 mm[Hg] (ref 44–60)
pH, Ven: 7.34 (ref 7.25–7.43)
pO2, Ven: 35 mm[Hg] (ref 32–45)

## 2023-03-17 LAB — COMPREHENSIVE METABOLIC PANEL
ALT: 15 U/L (ref 0–44)
AST: 20 U/L (ref 15–41)
Albumin: 3.4 g/dL — ABNORMAL LOW (ref 3.5–5.0)
Alkaline Phosphatase: 66 U/L (ref 38–126)
Anion gap: 10 (ref 5–15)
BUN: 18 mg/dL (ref 6–20)
CO2: 22 mmol/L (ref 22–32)
Calcium: 8.7 mg/dL — ABNORMAL LOW (ref 8.9–10.3)
Chloride: 103 mmol/L (ref 98–111)
Creatinine, Ser: 1.17 mg/dL (ref 0.61–1.24)
GFR, Estimated: >60 mL/min (ref >60)
Glucose, Bld: 363 mg/dL — ABNORMAL HIGH (ref 70–99)
Potassium: 4.1 mmol/L (ref 3.5–5.1)
Sodium: 135 mmol/L (ref 135–145)
Total Bilirubin: 0.5 mg/dL (ref 0.0–1.2)
Total Protein: 7.4 g/dL (ref 6.5–8.1)

## 2023-03-17 LAB — TROPONIN I (HIGH SENSITIVITY): Troponin I (High Sensitivity): 4 ng/L (ref <18)

## 2023-03-17 MED ORDER — BENZONATATE 100 MG PO CAPS: 100.0000 mg | ORAL_CAPSULE | Freq: Three times a day (TID) | ORAL | 0 refills | Status: DC | PRN

## 2023-03-17 MED ORDER — SODIUM CHLORIDE 0.9 % IV BOLUS
1000.0000 mL | Freq: Once | INTRAVENOUS | Status: AC
  Administered 2023-03-17: 1000 mL via INTRAVENOUS

## 2023-03-17 MED ORDER — ONDANSETRON HCL 4 MG/2ML IJ SOLN
4.0000 mg | Freq: Once | INTRAMUSCULAR | Status: DC
  Filled 2023-03-17: qty 2

## 2023-03-17 MED ORDER — ONDANSETRON 4 MG PO TBDP: 4.0000 mg | ORAL_TABLET | Freq: Three times a day (TID) | ORAL | 0 refills | Status: DC | PRN

## 2023-03-17 NOTE — ED Provider Notes (Signed)
[place] EMERGENCY DEPARTMENT AT [facility] Provider Note   CSN: [id number] Arrival date & time: 03/16/23  2220     History  Chief Complaint  Patient presents with   Chest Pain    [name] is a 58 y.o. male.  The history is provided by the patient.  Chest Pain  [name] is a 58 y.o. male who presents to the Emergency Department complaining of chest pain and difficulty breathing.  Symptoms started on Sunday with cough productive of yellow sputum, shortness of breath and left-sided chest pain.  He is unsure if fevers but is running hot and cold.  He has nausea and vomiting but no diarrhea.  No associated leg swelling or pain.  He has a history of diabetes and is compliant with his injections but has been vomiting up his pills.  He has decreased oral intake.     Home Medications Prior to Admission medications   Medication Sig Start Date End Date Taking? Authorizing Provider  benzonatate (TESSALON) 100 MG capsule Take 1 capsule (100 mg total) by mouth 3 (three) times daily as needed for cough. 03/17/23  Yes Tilden Fossa, MD  ondansetron (ZOFRAN-ODT) 4 MG disintegrating tablet Take 1 tablet (4 mg total) by mouth every 8 (eight) hours as needed. 03/17/23  Yes Tilden Fossa, MD  amoxicillin-clavulanate (AUGMENTIN) 875-125 MG tablet Take 1 tablet by mouth every 12 (twelve) hours. 08/30/22   Marita Kansas, PA-C  aspirin EC 81 MG tablet Take 81 mg by mouth daily. Swallow whole.    [provider]  citalopram (CELEXA) 20 MG tablet Take 20 mg by mouth daily. 08/28/21   [provider]  etodolac (LODINE) 400 MG tablet Take 1 tablet (400 mg total) by mouth 2 (two) times daily. 08/30/22   Karie Mainland, Amjad, PA-C  FARXIGA 10 MG TABS tablet Take 10 mg by mouth daily. 11/20/15   [provider]  gabapentin (NEURONTIN) 100 MG capsule Take 1 capsule (100 mg total) by mouth 3 (three) times daily. 01/04/23   Harris, Abigail, PA-C  glimepiride (AMARYL) 4 MG tablet Take 4  mg by mouth 2 (two) times daily.    [provider]  hydrochlorothiazide (HYDRODIURIL) 25 MG tablet Take 25 mg by mouth daily. 08/28/21   [provider]  HYDROcodone-acetaminophen (NORCO/VICODIN) 5-325 MG tablet Take 1-2 tablets by mouth every 6 (six) hours as needed. 08/30/22   Karie Mainland, Amjad, PA-C  lisinopril (PRINIVIL,ZESTRIL) 40 MG tablet Take 40 mg by mouth daily.  01/17/15   [provider]  lovastatin (MEVACOR) 40 MG tablet Take 80 mg by mouth daily. 08/28/21   [provider]  meclizine (ANTIVERT) 25 MG tablet Take 1 tablet (25 mg total) by mouth 3 (three) times daily as needed for dizziness. 07/15/22   Arthor Captain, PA-C  metFORMIN (GLUCOPHAGE) 850 MG tablet Take 1 tablet (850 mg total) by mouth 3 (three) times daily. 07/03/13   Piepenbrink, Victorino Dike, PA-C  metoprolol tartrate (LOPRESSOR) 50 MG tablet Take 50 mg by mouth daily.    [provider]  OZEMPIC, 2 MG/DOSE, 8 MG/3ML SOPN Inject 2 mg into the skin once a week. 12/15/21   [provider]  pantoprazole (PROTONIX) 40 MG tablet Take 40 mg by mouth daily. 02/18/20   [provider]  pioglitazone (ACTOS) 30 MG tablet Take 30 mg by mouth daily. 12/22/21   [provider]      Allergies    Patient has no known allergies.    Review of  Systems   Review of Systems  Cardiovascular:  Positive for chest pain.  All other systems reviewed and are negative.   Physical Exam Updated Vital Signs BP 138/79 (BP Location: Right Arm)   Pulse 83   Temp (!) 97.5 F (36.4 C) (Oral)   Resp 18   Ht 6\' 2"  (1.88 m)   Wt 113.4 kg   SpO2 100%   BMI 32.10 kg/m  Physical Exam Vitals and nursing note reviewed.  Constitutional:      Appearance: He is well-developed.  HENT:     Head: Normocephalic and atraumatic.  Cardiovascular:     Rate and Rhythm: Normal rate and regular rhythm.     Heart sounds: No murmur heard. Pulmonary:     Effort: Pulmonary effort is normal. No  respiratory distress.     Breath sounds: Normal breath sounds.  Abdominal:     Palpations: Abdomen is soft.     Tenderness: There is no abdominal tenderness. There is no guarding or rebound.  Musculoskeletal:        General: No swelling or tenderness.  Skin:    General: Skin is warm and dry.  Neurological:     Mental Status: He is alert and oriented to person, place, and time.  Psychiatric:        Behavior: Behavior normal.     ED Results / Procedures / Treatments   Labs (all labs ordered are listed, but only abnormal results are displayed) Labs Reviewed  RESP PANEL BY RT-PCR (RSV, FLU A&B, COVID)  RVPGX2 - Abnormal; Notable for the following components:      Result Value   Influenza A by PCR POSITIVE (*)    All other components within normal limits  BASIC METABOLIC PANEL - Abnormal; Notable for the following components:   Sodium 130 (*)    CO2 17 (*)    Glucose, Bld 536 (*)    Creatinine, Ser 1.39 (*)    GFR, Estimated 59 (*)    All other components within normal limits  BLOOD GAS, VENOUS - Abnormal; Notable for the following components:   Acid-base deficit 2.2 (*)    All other components within normal limits  COMPREHENSIVE METABOLIC PANEL - Abnormal; Notable for the following components:   Glucose, Bld 363 (*)    Calcium 8.7 (*)    Albumin 3.4 (*)    All other components within normal limits  URINALYSIS, W/ REFLEX TO CULTURE (INFECTION SUSPECTED) - Abnormal; Notable for the following components:   Color, Urine STRAW (*)    Specific Gravity, Urine 1.031 (*)    Glucose, UA >=500 (*)    All other components within normal limits  CBC  TROPONIN I (HIGH SENSITIVITY)  TROPONIN I (HIGH SENSITIVITY)    EKG EKG Interpretation Date/Time:  Wednesday March 16 2023 22:28:28 EST Ventricular Rate:  98 PR Interval:  149 QRS Duration:  96 QT Interval:  349 QTC Calculation: 446 R Axis:   1  Text Interpretation: Sinus rhythm Left ventricular hypertrophy Confirmed by Tilden Fossa [id number]) on 03/16/2023 10:47:23 PM  Radiology DG Chest 2 View Result Date: 03/16/2023 CLINICAL DATA:  Chest pain and productive cough EXAM: CHEST - 2 VIEW COMPARISON:  07/15/2022 FINDINGS: The heart size and mediastinal contours are within normal limits. Both lungs are clear. The visualized skeletal structures are unremarkable. IMPRESSION: No active cardiopulmonary disease. Electronically Signed   By: Minerva Fester M.D.   On: 03/16/2023 23:07    Procedures Procedures    Medications Ordered  in ED Medications  ondansetron (ZOFRAN) injection 4 mg (4 mg Intravenous Not Given 03/17/23 0242)  sodium chloride 0.9 % bolus 1,000 mL (0 mLs Intravenous Stopped 03/17/23 0354)    ED Course/ Medical Decision Making/ A&P                                 Medical Decision Making Amount and/or Complexity of Data Reviewed Labs: ordered. Radiology: ordered.  Risk Prescription drug management.   Patient with diabetes here for evaluation of shortness of breath, nausea, left-sided chest pain.  He appears dehydrated on examination with no respiratory distress.  Lungs are clear on evaluation.  EKG is nonischemic and troponins are negative x 2.  Initial BMP with hyperglycemia, hyponatremia, decreased bicarb with normal anion gap.  Suspect this is secondary to dehydration.  He was treated with IV fluids and antiemetic and he was able to tolerate p.o.  Repeat BMP with improvement in his sodium, glucose as well as bicarb.  UA with no ketones.  Urine is concentrated.  Not consistent with UTI.  Current clinical picture is not consistent with ACS, PE, pneumonia, sepsis, DKA.  Patient does report improvement in his symptoms on repeat evaluation.  Discussed findings of influenza, dehydration and hyperglycemia.  Discussed restarting his medications at home.  Also discussed oral fluid hydration and return precautions.        Final Clinical Impression(s) / ED Diagnoses Final diagnoses:  Nonspecific chest  pain  Influenza A  Hyperglycemia  Dehydration    Rx / DC Orders ED Discharge Orders          Ordered    ondansetron (ZOFRAN-ODT) 4 MG disintegrating tablet  Every 8 hours PRN        03/17/23 0447    benzonatate (TESSALON) 100 MG capsule  3 times daily PRN        03/17/23 0447              Tilden Fossa, MD 03/17/23 [phone]

## 2023-06-19 ENCOUNTER — Other Ambulatory Visit: Payer: Self-pay

## 2023-06-19 ENCOUNTER — Emergency Department (HOSPITAL_COMMUNITY)
Admission: EM | Admit: 2023-06-19 | Discharge: 2023-06-20 | Disposition: A | Discharge status: Home / Self Care | Attending: Emergency Medicine | Admitting: Emergency Medicine

## 2023-06-19 ENCOUNTER — Encounter (HOSPITAL_COMMUNITY): Payer: Self-pay

## 2023-06-19 ENCOUNTER — Emergency Department (HOSPITAL_COMMUNITY)

## 2023-06-19 DIAGNOSIS — R0789 Other chest pain: Secondary | ICD-10-CM | POA: Insufficient documentation

## 2023-06-19 DIAGNOSIS — Z7982 Long term (current) use of aspirin: Secondary | ICD-10-CM | POA: Diagnosis not present

## 2023-06-19 LAB — BASIC METABOLIC PANEL WITH GFR
Anion gap: 9 (ref 5–15)
BUN: 15 mg/dL (ref 6–20)
CO2: 21 mmol/L — ABNORMAL LOW (ref 22–32)
Calcium: 9.4 mg/dL (ref 8.9–10.3)
Chloride: 104 mmol/L (ref 98–111)
Creatinine, Ser: 0.92 mg/dL (ref 0.61–1.24)
GFR, Estimated: >60 mL/min (ref >60)
Glucose, Bld: 195 mg/dL — ABNORMAL HIGH (ref 70–99)
Potassium: 4.1 mmol/L (ref 3.5–5.1)
Sodium: 134 mmol/L — ABNORMAL LOW (ref 135–145)

## 2023-06-19 LAB — CBC
HCT: 42.1 % (ref 39.0–52.0)
Hemoglobin: 14 g/dL (ref 13.0–17.0)
MCH: 29.4 pg (ref 26.0–34.0)
MCHC: 33.3 g/dL (ref 30.0–36.0)
MCV: 88.3 fL (ref 80.0–100.0)
Platelets: 263 10*3/uL (ref 150–400)
RBC: 4.77 MIL/uL (ref 4.22–5.81)
RDW: 13.6 % (ref 11.5–15.5)
WBC: 6.5 10*3/uL (ref 4.0–10.5)
nRBC: 0 % (ref 0.0–0.2)

## 2023-06-19 NOTE — ED Triage Notes (Signed)
 Pt reports middle to right side stabbing chest pain that woke him up this AM and pain has gotten worse through the day. Pt reports SHOB as well and increased pain with movement and palpation. Pt denies any injury.

## 2023-06-19 NOTE — ED Provider Notes (Signed)
 [place] EMERGENCY DEPARTMENT AT [facility] Provider Note   CSN: [id number] Arrival date & time: 06/19/23  2111     History  Chief Complaint  Patient presents with   Chest Pain    [name] is a 58 y.o. male.  Patient presents to the emergency department for evaluation of chest pain.  Patient reports that he had pain in the center of his chest when he woke up this morning.  Pain persisted throughout the day, occasionally more sharp and stabbing.  He has had some radiation to the left arm.  Patient notices that the pain worsens when he raises his left arm.  No known injury.       Home Medications Prior to Admission medications   Medication Sig Start Date End Date Taking? Authorizing Provider  aspirin  EC 81 MG tablet Take 81 mg by mouth daily. Swallow whole.    [provider]  citalopram  (CELEXA ) 20 MG tablet Take 20 mg by mouth daily. 08/28/21   [provider]  FARXIGA 10 MG TABS tablet Take 10 mg by mouth daily. 11/20/15   [provider]  gabapentin  (NEURONTIN ) 100 MG capsule Take 1 capsule (100 mg total) by mouth 3 (three) times daily. 01/04/23   Harris, Abigail, PA-C  glimepiride  (AMARYL ) 4 MG tablet Take 4 mg by mouth 2 (two) times daily.    [provider]  hydrochlorothiazide (HYDRODIURIL) 25 MG tablet Take 25 mg by mouth daily. 08/28/21   [provider]  lisinopril  (PRINIVIL ,ZESTRIL ) 40 MG tablet Take 40 mg by mouth daily.  01/17/15   [provider]  lovastatin (MEVACOR) 40 MG tablet Take 80 mg by mouth daily. 08/28/21   [provider]  metFORMIN  (GLUCOPHAGE ) 850 MG tablet Take 1 tablet (850 mg total) by mouth 3 (three) times daily. 07/03/13   Piepenbrink, Bridgette Campus, PA-C  metoprolol  tartrate (LOPRESSOR ) 50 MG tablet Take 50 mg by mouth daily.    [provider]  OZEMPIC, 2 MG/DOSE, 8 MG/3ML SOPN Inject 2 mg into the skin once a week. 12/15/21   [provider]  pantoprazole   (PROTONIX ) 40 MG tablet Take 40 mg by mouth daily. 02/18/20   [provider]  pioglitazone (ACTOS) 30 MG tablet Take 30 mg by mouth daily. 12/22/21   [provider]      Allergies    Patient has no known allergies.    Review of Systems   Review of Systems  Physical Exam Updated Vital Signs BP 121/69   Pulse 60   Temp 97.7 F (36.5 C) (Oral)   Resp 18   Ht 6\' 2"  (1.88 m)   Wt 113.4 kg   SpO2 100%   BMI 32.10 kg/m  Physical Exam Vitals and nursing note reviewed.  Constitutional:      General: He is not in acute distress.    Appearance: He is well-developed.  HENT:     Head: Normocephalic and atraumatic.     Mouth/Throat:     Mouth: Mucous membranes are moist.  Eyes:     General: Vision grossly intact. Gaze aligned appropriately.     Extraocular Movements: Extraocular movements intact.     Conjunctiva/sclera: Conjunctivae normal.  Cardiovascular:     Rate and Rhythm: Normal rate and regular rhythm.     Pulses: Normal pulses.     Heart sounds: Normal heart sounds, S1 normal and S2 normal. No murmur heard.    No friction rub. No gallop.  Pulmonary:  Effort: Pulmonary effort is normal. No respiratory distress.     Breath sounds: Normal breath sounds.  Chest:     Chest wall: Tenderness (Significant tenderness, reproduces pain) present.  Abdominal:     Palpations: Abdomen is soft.     Tenderness: There is no abdominal tenderness. There is no guarding or rebound.     Hernia: No hernia is present.  Musculoskeletal:        General: No swelling.     Cervical back: Full passive range of motion without pain, normal range of motion and neck supple. No pain with movement, spinous process tenderness or muscular tenderness. Normal range of motion.     Right lower leg: No edema.     Left lower leg: No edema.  Skin:    General: Skin is warm and dry.     Capillary Refill: Capillary refill takes less than 2 seconds.     Findings: No ecchymosis, erythema,  lesion or wound.  Neurological:     Mental Status: He is alert and oriented to person, place, and time.     GCS: GCS eye subscore is 4. GCS verbal subscore is 5. GCS motor subscore is 6.     Cranial Nerves: Cranial nerves 2-12 are intact.     Sensory: Sensation is intact.     Motor: Motor function is intact. No weakness or abnormal muscle tone.     Coordination: Coordination is intact.  Psychiatric:        Mood and Affect: Mood normal.        Speech: Speech normal.        Behavior: Behavior normal.     ED Results / Procedures / Treatments   Labs (all labs ordered are listed, but only abnormal results are displayed) Labs Reviewed  BASIC METABOLIC PANEL WITH GFR - Abnormal; Notable for the following components:      Result Value   Sodium 134 (*)    CO2 21 (*)    Glucose, Bld 195 (*)    All other components within normal limits  CBC  TROPONIN I (HIGH SENSITIVITY)    EKG EKG Interpretation Date/Time:  Sunday Jun 19 2023 21:20:59 EDT Ventricular Rate:  80 PR Interval:  161 QRS Duration:  88 QT Interval:  369 QTC Calculation: 426 R Axis:   -19  Text Interpretation: Sinus rhythm Abnormal R-wave progression, early transition Left ventricular hypertrophy No significant change since last tracing Confirmed by Ballard Bongo [phone]) on 06/19/2023 11:54:00 PM  Radiology DG Chest 2 View Result Date: 06/19/2023 CLINICAL DATA:  Chest pain EXAM: CHEST - 2 VIEW COMPARISON:  Chest x-ray 03/16/2023 FINDINGS: The heart size and mediastinal contours are within normal limits. Both lungs are clear. The visualized skeletal structures are unremarkable. IMPRESSION: No active cardiopulmonary disease. Electronically Signed   By: Tyron Gallon M.D.   On: 06/19/2023 21:58    Procedures Procedures    Medications Ordered in ED Medications  ketorolac  (TORADOL ) injection 30 mg (30 mg Intramuscular Given 06/20/23 0136)    ED Course/ Medical Decision Making/ A&P                                  Medical Decision Making Risk Prescription drug management.   Differential Diagnosis considered includes, but not limited to: STEMI; NSTEMI; myocarditis; pericarditis; pulmonary embolism; aortic dissection; pneumothorax; pneumonia; gastritis; musculoskeletal pain  Presents to the emergency department for evaluation of chest pain.  Patient has had pain throughout the day continuously.  Examination is reassuring.  Pain is very reproducible.  He has pain with movement of the arm in the chest is very tender to the touch.  EKG unchanged.  First troponin did not run because of laboratory issues.  His second troponin was drawn hours into his stay here and is negative.  Based on this and the fact that he has had pain throughout the day, I do not feel he requires an additional troponin.   Patient appears to be breathing comfortably.  Oxygen saturation is 100%.  No tachycardia.  Based on his examination, PE is felt to be very low likelihood.  Normal mediastinum and lungs on x-ray.   Will treat for musculoskeletal pain, follow-up with primary care.        Final Clinical Impression(s) / ED Diagnoses Final diagnoses:  Chest wall pain    Rx / DC Orders ED Discharge Orders     None         Ballard Bongo, MD 06/20/23 0201

## 2023-06-20 LAB — TROPONIN I (HIGH SENSITIVITY): Troponin I (High Sensitivity): 4 ng/L (ref <18)

## 2023-06-20 MED ORDER — KETOROLAC TROMETHAMINE 30 MG/ML IJ SOLN
30.0000 mg | Freq: Once | INTRAMUSCULAR | Status: DC
  Filled 2023-06-20: qty 1

## 2023-06-20 MED ORDER — KETOROLAC TROMETHAMINE 60 MG/2ML IM SOLN
30.0000 mg | Freq: Once | INTRAMUSCULAR | Status: AC
  Administered 2023-06-20: 30 mg via INTRAMUSCULAR
